# Patient Record
Sex: Female | Born: 1955
Health system: Southern US, Community
[De-identification: ages and names within clinical notes are randomized; demographics above are authoritative.]

## PROBLEM LIST (undated history)

## (undated) DIAGNOSIS — M419 Scoliosis, unspecified: Secondary | ICD-10-CM

## (undated) DIAGNOSIS — I639 Cerebral infarction, unspecified: Secondary | ICD-10-CM

## (undated) DIAGNOSIS — E785 Hyperlipidemia, unspecified: Secondary | ICD-10-CM

## (undated) DIAGNOSIS — M810 Age-related osteoporosis without current pathological fracture: Secondary | ICD-10-CM

## (undated) DIAGNOSIS — F411 Generalized anxiety disorder: Secondary | ICD-10-CM

## (undated) DIAGNOSIS — C539 Malignant neoplasm of cervix uteri, unspecified: Secondary | ICD-10-CM

## (undated) DIAGNOSIS — F32A Depression, unspecified: Secondary | ICD-10-CM

## (undated) DIAGNOSIS — Z923 Personal history of irradiation: Secondary | ICD-10-CM

## (undated) DIAGNOSIS — R87619 Unspecified abnormal cytological findings in specimens from cervix uteri: Secondary | ICD-10-CM

## (undated) DIAGNOSIS — M1990 Unspecified osteoarthritis, unspecified site: Secondary | ICD-10-CM

## (undated) DIAGNOSIS — Z8669 Personal history of other diseases of the nervous system and sense organs: Secondary | ICD-10-CM

## (undated) DIAGNOSIS — K219 Gastro-esophageal reflux disease without esophagitis: Secondary | ICD-10-CM

## (undated) DIAGNOSIS — F419 Anxiety disorder, unspecified: Secondary | ICD-10-CM

## (undated) DIAGNOSIS — R52 Pain, unspecified: Secondary | ICD-10-CM

## (undated) DIAGNOSIS — K8020 Calculus of gallbladder without cholecystitis without obstruction: Secondary | ICD-10-CM

## (undated) HISTORY — DX: Unspecified osteoarthritis, unspecified site: M19.90

## (undated) HISTORY — DX: Personal history of irradiation: Z92.3

## (undated) HISTORY — PX: CHOLECYSTECTOMY: SHX55

## (undated) HISTORY — DX: Hyperlipidemia, unspecified: E78.5

## (undated) HISTORY — DX: Gastro-esophageal reflux disease without esophagitis: K21.9

## (undated) HISTORY — DX: Calculus of gallbladder without cholecystitis without obstruction: K80.20

## (undated) HISTORY — DX: Depression, unspecified: F32.A

## (undated) HISTORY — PX: TENDON REPAIR: SHX5111

## (undated) HISTORY — PX: FOOT SURGERY: SHX648

## (undated) HISTORY — DX: Cerebral infarction, unspecified: I63.9

## (undated) HISTORY — DX: Anxiety disorder, unspecified: F41.9

## (undated) HISTORY — DX: Age-related osteoporosis without current pathological fracture: M81.0

## (undated) MED FILL — Sodium Citrate Lock Flush IV Soln Pref Syr 120 MG/3ML (4%): INTRAVENOUS | Qty: 6 | Status: AC

---
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
## 1999-10-10 ENCOUNTER — Other Ambulatory Visit: Admission: RE | Admit: 1999-10-10 | Discharge: 1999-10-10 | Payer: Self-pay | Admitting: Gynecology

## 2000-03-27 ENCOUNTER — Encounter: Admission: RE | Admit: 2000-03-27 | Discharge: 2000-03-27 | Payer: Self-pay | Admitting: Gynecology

## 2000-03-27 ENCOUNTER — Encounter: Payer: Self-pay | Admitting: Gynecology

## 2000-10-08 ENCOUNTER — Other Ambulatory Visit: Admission: RE | Admit: 2000-10-08 | Discharge: 2000-10-08 | Payer: Self-pay | Admitting: Gynecology

## 2001-06-03 ENCOUNTER — Encounter: Admission: RE | Admit: 2001-06-03 | Discharge: 2001-06-03 | Payer: Self-pay | Admitting: Gynecology

## 2001-06-03 ENCOUNTER — Encounter: Payer: Self-pay | Admitting: Gynecology

## 2001-10-14 ENCOUNTER — Other Ambulatory Visit: Admission: RE | Admit: 2001-10-14 | Discharge: 2001-10-14 | Payer: Self-pay | Admitting: Gynecology

## 2003-05-08 ENCOUNTER — Encounter: Payer: Self-pay | Admitting: Gynecology

## 2003-05-08 ENCOUNTER — Encounter: Admission: RE | Admit: 2003-05-08 | Discharge: 2003-05-08 | Payer: Self-pay | Admitting: Gynecology

## 2003-05-08 ENCOUNTER — Other Ambulatory Visit: Admission: RE | Admit: 2003-05-08 | Discharge: 2003-05-08 | Payer: Self-pay | Admitting: Gynecology

## 2003-08-22 HISTORY — PX: CHOLECYSTECTOMY OPEN: SUR202

## 2004-05-17 ENCOUNTER — Other Ambulatory Visit: Admission: RE | Admit: 2004-05-17 | Discharge: 2004-05-17 | Payer: Self-pay | Admitting: Gynecology

## 2004-05-17 ENCOUNTER — Encounter: Admission: RE | Admit: 2004-05-17 | Discharge: 2004-05-17 | Payer: Self-pay | Admitting: Gynecology

## 2005-08-16 ENCOUNTER — Encounter: Admission: RE | Admit: 2005-08-16 | Discharge: 2005-08-16 | Payer: Self-pay | Admitting: Gynecology

## 2005-08-16 ENCOUNTER — Other Ambulatory Visit: Admission: RE | Admit: 2005-08-16 | Discharge: 2005-08-16 | Payer: Self-pay | Admitting: Gynecology

## 2006-08-21 HISTORY — PX: TENDON REPAIR: SHX5111

## 2006-08-24 ENCOUNTER — Other Ambulatory Visit: Admission: RE | Admit: 2006-08-24 | Discharge: 2006-08-24 | Payer: Self-pay | Admitting: Gynecology

## 2006-08-27 ENCOUNTER — Encounter: Admission: RE | Admit: 2006-08-27 | Discharge: 2006-08-27 | Payer: Self-pay | Admitting: Gynecology

## 2007-09-02 ENCOUNTER — Encounter: Admission: RE | Admit: 2007-09-02 | Discharge: 2007-09-02 | Payer: Self-pay | Admitting: Gynecology

## 2008-09-03 ENCOUNTER — Encounter: Admission: RE | Admit: 2008-09-03 | Discharge: 2008-09-03 | Payer: Self-pay | Admitting: Gynecology

## 2009-09-24 ENCOUNTER — Encounter: Admission: RE | Admit: 2009-09-24 | Discharge: 2009-09-24 | Payer: Self-pay | Admitting: Gynecology

## 2010-09-29 ENCOUNTER — Other Ambulatory Visit: Payer: Self-pay | Admitting: Gynecology

## 2010-09-29 DIAGNOSIS — Z1231 Encounter for screening mammogram for malignant neoplasm of breast: Secondary | ICD-10-CM

## 2010-11-15 ENCOUNTER — Ambulatory Visit
Admission: RE | Admit: 2010-11-15 | Discharge: 2010-11-15 | Disposition: A | Discharge status: Home / Self Care | Payer: 59 | Source: Ambulatory Visit | Attending: Gynecology | Admitting: Gynecology

## 2010-11-15 DIAGNOSIS — Z1231 Encounter for screening mammogram for malignant neoplasm of breast: Secondary | ICD-10-CM

## 2011-10-04 ENCOUNTER — Other Ambulatory Visit: Payer: Self-pay | Admitting: Gynecology

## 2011-10-04 DIAGNOSIS — Z1231 Encounter for screening mammogram for malignant neoplasm of breast: Secondary | ICD-10-CM

## 2011-11-16 ENCOUNTER — Ambulatory Visit
Admission: RE | Admit: 2011-11-16 | Discharge: 2011-11-16 | Disposition: A | Discharge status: Home / Self Care | Payer: 59 | Source: Ambulatory Visit | Attending: Gynecology | Admitting: Gynecology

## 2011-11-16 DIAGNOSIS — Z1231 Encounter for screening mammogram for malignant neoplasm of breast: Secondary | ICD-10-CM

## 2012-09-21 LAB — HM PAP SMEAR: HM PAP: NORMAL

## 2012-09-21 LAB — HM MAMMOGRAPHY

## 2012-12-30 ENCOUNTER — Other Ambulatory Visit: Payer: Self-pay

## 2012-12-30 DIAGNOSIS — Z1231 Encounter for screening mammogram for malignant neoplasm of breast: Secondary | ICD-10-CM

## 2013-01-21 ENCOUNTER — Ambulatory Visit
Admission: RE | Admit: 2013-01-21 | Discharge: 2013-01-21 | Disposition: A | Discharge status: Home / Self Care | Payer: 59 | Source: Ambulatory Visit

## 2013-01-21 DIAGNOSIS — Z1231 Encounter for screening mammogram for malignant neoplasm of breast: Secondary | ICD-10-CM

## 2013-09-02 ENCOUNTER — Ambulatory Visit: Payer: 59 | Admitting: Family Medicine

## 2013-09-02 ENCOUNTER — Ambulatory Visit (INDEPENDENT_AMBULATORY_CARE_PROVIDER_SITE_OTHER): Payer: 59 | Admitting: Family Medicine

## 2013-09-02 ENCOUNTER — Encounter: Payer: Self-pay | Admitting: Family Medicine

## 2013-09-02 VITALS — BP 110/70 | Temp 99.0°F | Ht 65.25 in | Wt 166.0 lb

## 2013-09-02 DIAGNOSIS — Z23 Encounter for immunization: Secondary | ICD-10-CM

## 2013-09-02 DIAGNOSIS — M810 Age-related osteoporosis without current pathological fracture: Secondary | ICD-10-CM

## 2013-09-02 HISTORY — DX: Age-related osteoporosis without current pathological fracture: M81.0

## 2013-09-02 NOTE — Progress Notes (Signed)
Chief Complaint  Patient presents with  . Establish Care    HPI:  Taylor Buchanan is here to establish care.  Last PCP and physical: Used to see Dr. Ubaldo Glassing, now seeing Eve [facility] - getting yearly physicals. Had a pap in feb and was normal, all normal for 15 years. Had labs in last few years and normal.  Has the following chronic problems and concerns today:  Has osteoporosis: -has been bisphosphates and did not feel well - but really doesn't want to take these -reports has severe osteoporosis - would like to speak with endocrine about options/risks/etc  Patient Active Problem List   Diagnosis Date Noted  . Osteoporosis, unspecified 09/02/2013   Health Maintenance: UTD  ROS: See pertinent positives and negatives per HPI.  Past Medical History  Diagnosis Date  . Osteoporosis, unspecified 09/02/2013    Family History  Problem Relation Age of Onset  . Mental illness Mother   . Cancer Father     bladder    History   Social History  . Marital Status: Married    Spouse Name: N/A    Number of Children: N/A  . Years of Education: N/A   Social History Main Topics  . Smoking status: Former Research scientist (life sciences)  . Smokeless tobacco: None     Comment: 1 ppd for 15 years, quit 20 years ago  . Alcohol Use: Yes     Comment: occ   . Drug Use: None  . Sexual Activity: None   Other Topics Concern  . 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  GENERAL: vitals  reviewed and listed above, alert, oriented, appears well hydrated and in no acute distress  HEENT: atraumatic, conjunttiva clear, no obvious abnormalities on inspection of external nose and ears  NECK: no obvious masses on inspection  LUNGS: clear to auscultation bilaterally, no wheezes, rales or rhonchi, good air movement  CV: HRRR, no peripheral edema  MS: moves all extremities without noticeable abnormality  PSYCH: pleasant and cooperative, no obvious depression or anxiety  ASSESSMENT AND PLAN:  Discussed the following assessment and plan:  Need for prophylactic vaccination with combined diphtheria-tetanus-pertussis (DTP) vaccine - Plan: Tdap vaccine greater than or equal to 7yo IM  Osteoporosis - Plan: Ambulatory referral to Endocrinology  Osteoporosis, unspecified -We reviewed the PMH, PSH, FH, SH, Meds and Allergies. -We provided refills for any medications we will prescribe as needed. -We addressed current concerns per orders and patient instructions. -We have asked for records for pertinent exams, studies, vaccines and notes from previous providers. -We have advised patient to follow up per instructions below. -follow up 3-4 months for physical and carpal tunnel - she wants to get women's physicals with [place]  -Patient advised to return or notify a doctor immediately if symptoms worsen or persist or new concerns arise.  Patient Instructions  --PLEASE SIGN UP FOR MYCHART TODAY   We recommend the following healthy lifestyle measures: - eat a healthy diet consisting of lots of vegetables, fruits, beans, nuts, seeds, healthy meats such as white chicken and fish and whole grains.  - avoid fried foods, fast food, processed foods, sodas, red meet and other fattening foods.  - get a least 150 minutes of aerobic exercise per week.   Vitamin D3 1000 IU daily  Calcium 1200mg  total from diet and supplement  Weightbearing exercise at least 5 days per week  -We placed a referral  for you as discussed. It usually takes about 1-2 weeks to process and schedule this referral. If you have not heard from [place] regarding this appointment in 2 weeks please contact our office.  -Cock Up Brace for hand pain  Follow up in: 3-4 months      KIM, HANNAH R.

## 2013-09-02 NOTE — Patient Instructions (Signed)
--  PLEASE SIGN UP FOR MYCHART TODAY   We recommend the following healthy lifestyle measures: - eat a healthy diet consisting of lots of vegetables, fruits, beans, nuts, seeds, healthy meats such as white chicken and fish and whole grains.  - avoid fried foods, fast food, processed foods, sodas, red meet and other fattening foods.  - get a least 150 minutes of aerobic exercise per week.   Vitamin D3 1000 IU daily  Calcium 1200mg  total from diet and supplement  Weightbearing exercise at least 5 days per week  -We placed a referral for you as discussed. It usually takes about 1-2 weeks to process and schedule this referral. If you have not heard from [place] regarding this appointment in 2 weeks please contact our office.  -Cock Up Brace for hand pain  Follow up in: 3-4 months

## 2013-10-06 ENCOUNTER — Ambulatory Visit: Payer: 59 | Admitting: Internal Medicine

## 2013-11-03 ENCOUNTER — Other Ambulatory Visit: Payer: Self-pay | Admitting: Internal Medicine

## 2013-11-03 ENCOUNTER — Ambulatory Visit (INDEPENDENT_AMBULATORY_CARE_PROVIDER_SITE_OTHER): Payer: 59 | Admitting: Internal Medicine

## 2013-11-03 ENCOUNTER — Encounter: Payer: Self-pay | Admitting: Internal Medicine

## 2013-11-03 VITALS — BP 114/68 | HR 51 | Temp 97.9°F | Resp 12 | Ht 65.75 in | Wt 166.0 lb

## 2013-11-03 DIAGNOSIS — M810 Age-related osteoporosis without current pathological fracture: Secondary | ICD-10-CM

## 2013-11-03 LAB — COMPREHENSIVE METABOLIC PANEL
ALBUMIN: 4.3 g/dL (ref 3.5–5.2)
ALT: 12 U/L (ref 0–35)
AST: 23 U/L (ref 0–37)
Alkaline Phosphatase: 57 U/L (ref 39–117)
BUN: 15 mg/dL (ref 6–23)
CALCIUM: 9.4 mg/dL (ref 8.4–10.5)
CHLORIDE: 104 meq/L (ref 96–112)
CO2: 28 meq/L (ref 19–32)
Creat: 0.7 mg/dL (ref 0.50–1.10)
Glucose, Bld: 81 mg/dL (ref 70–99)
POTASSIUM: 4.3 meq/L (ref 3.5–5.3)
SODIUM: 139 meq/L (ref 135–145)
TOTAL PROTEIN: 6.6 g/dL (ref 6.0–8.3)
Total Bilirubin: 0.3 mg/dL (ref 0.2–1.2)

## 2013-11-03 NOTE — Progress Notes (Signed)
Patient ID: Taylor Buchanan, female   DOB: 1955/08/30, 58 y.o.   MRN: [id number]   HPI  Taylor Buchanan is a 58 y.o.-year-old female, referred by her PCP, Dr. Maudie Mercury, for evaluation for osteoporosis. She saw Dr Ubaldo Glassing in the past (he retired) - he was managing her OP.  Pt was dx with OP in 2011 and had Openia since 2005 (first 2005). She denies any fractures or falls. No dizziness/vertigo/orthostasis. She feels she shrunk 1 in.  I reviewed pt's DEXA scans - Dr Ubaldo Glassing office [facility]): Date L1-L4 T score (*severe scoliosis) FN T score 33% distal Radius  11/15/2010 -2.3 LFN: -2.2, RFN: -1.9;  mean -2.1 n/a  09/24/2009 -2.5 mean -1.9   08/24/2006 -1.6 mean -1.5   05/17/2004 -1.5 mean -2.3    She has been on the following OP treatments:  - started Actonel, then switched to Boniva  - for 1 year >> improvement in BMD >> stopped 2-3 years ago  No h/o hyper/hypocalcemia. No h/o hyperparathyroidism. No h/o kidney stones.  No h/o thyrotoxicosis. Last tsh 2.245 in 2012.  No h/o vitamin D deficiency.   No h/o CKD. BUN/Cr 10/0.76 in 2012, I do not have recent labs.  Pt is on calcium carbonated 600 mg  - takes it fasting (in am) and vitamin D 1400 units a day. She also eats dairy and green, leafy, vegetables.   No weight bearing exercises.   She does not take high vitamin A doses.  Pt does have a FH of osteoporosis - great aunt broke hip. No FH of kyphosis.   Menopause was at 58 y/o - right after father died.   ROS: Constitutional: no weight gain/loss, no fatigue, no subjective hyperthermia/hypothermia Eyes: no blurry vision, no xerophthalmia ENT: no sore throat, no nodules palpated in throat, no dysphagia/odynophagia, no hoarseness Cardiovascular: no CP/SOB/palpitations/leg swelling Respiratory: no cough/SOB Gastrointestinal: no N/V/D/C Musculoskeletal: no muscle/joint aches Skin: no rashes Neurological: no tremors/numbness/tingling/dizziness Psychiatric: no depression/anxiety  Past Medical  History  Diagnosis Date  . Osteoporosis, unspecified 09/02/2013   Past Surgical History  Procedure Laterality Date  . Cholecystectomy    . Foot surgery     History   Social History  . Marital Status: Married    Spouse Name: N/A    Number of Children: 2   Social History Main Topics  . Smoking status: Former Research scientist (life sciences)  . Smokeless tobacco: Not on file     Comment: 1 ppd for 15 years, quit 20 years ago  . Alcohol Use: Yes     Comment: occ   . 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 calcium carbonate (OS-CAL) 600 MG TABS tablet Take 600 mg by mouth 2 (two) times daily with a meal.      . cholecalciferol (VITAMIN D) 1000 UNITS tablet Take 1,000 Units by mouth daily.       No current facility-administered medications on file prior to visit.   No Known Allergies Family History  Problem Relation Age of Onset  . Mental illness Mother   . Cancer Father     bladder   PE: BP 114/68  Pulse 51  Temp(Src) 97.9 F (36.6 C) (Oral)  Resp 12  Ht 5' 5.75" (1.67 m)  Wt 166 lb (75.297 kg)  BMI 27.00 kg/m2  SpO2 97%  LMP 08/21/2002 Wt Readings from Last 3 Encounters:  11/03/13 166 lb (75.297 kg)  09/02/13 166 lb (75.297 kg)   Constitutional: normal weight, in NAD. No kyphosis.  Eyes: PERRLA, EOMI, no exophthalmos ENT: moist mucous membranes, no thyromegaly, no cervical lymphadenopathy Cardiovascular: RRR, No MRG Respiratory: CTA B Gastrointestinal: abdomen soft, NT, ND, BS+ Musculoskeletal: no deformities, strength intact in all 4 Skin: moist, warm, no rashes Neurological: no tremor with outstretched hands, DTR normal in all 4  Assessment: 1. Osteoporosis - 1 year po bisphosphonates - noncompliance  Plan: 1. Osteoporosis -  likely postmenopausal  - Discussed about increased risk of fracture, depending on the T score, greatly increased when the T score is at or lower than -2.5  - We reviewed her previous DEXA scans together, and I explained that based on the T scores, she has an increased risk for fractures.  - We discussed about the different medication classes, benefits and side effects (including atypical fractures and ONJ - no dental workup in progress or planned). She has a h/o noncompliance with  oral bisphosphonates, so my first choice would be IV bisphosphonate (zoledronic acid = Reclast) or sq denosumab (Prolia). I don't think she is a candidate for Teriparatide at the moment. She agrees that, if needed, the once a year or once every 6 mo tx would be a much better choice for her than a daily med. - we reviewed her dietary and supplemental calcium and vitamin D intake, which I believe are adequate. Pt ends up getting at least 1500 units vitamin D at least 1200 mg of calcium daily. I advised her to continue this - given her specific instructions about food sources for these - see pt instructions  - discussed fall precautions  - given handout from [facility]: weight bearing exercises - every day or at least 5/7 days - we discussed about different possible directions for management. I advised her to try to have a daily protein amount of ~0.8 g per kilogram per day. I advised her to try to aim for this amount, since a diet low in proteins can exacerbate osteoporosis. She does not smoke or drink >2 drinks of alcohol a day.  - We will check the following tests:  Vitamin D  CMP - will check a new DEXA scan and if decreases further, I would strongly encourage treatment, likely in the form of zoledronic acid or denosumab  - will see pt back in a year  Orders Only on 11/03/2013  Component Date Value Ref Range Status  . Sodium 11/03/2013 139  135 - 145 mEq/L Final  . Potassium 11/03/2013 4.3  3.5  - 5.3 mEq/L Final  . Chloride 11/03/2013 104  96 - 112 mEq/L Final  . CO2 11/03/2013 28  19 - 32 mEq/L Final  . Glucose, Bld 11/03/2013 81  70 - 99 mg/dL Final  . BUN 11/03/2013 15  6 - 23 mg/dL Final  . Creat 11/03/2013 0.70  0.50 - 1.10 mg/dL Final  . Total Bilirubin 11/03/2013 0.3  0.2 - 1.2 mg/dL Final  . Alkaline Phosphatase 11/03/2013 57  39 - 117 U/L Final  . AST 11/03/2013 23  0 - 37 U/L Final  . ALT 11/03/2013 12  0 - 35 U/L Final  . Total Protein 11/03/2013 6.6  6.0 - 8.3 g/dL Final  . Albumin 11/03/2013 4.3  3.5 -  5.2 g/dL Final  . Calcium 11/03/2013 9.4  8.4 - 10.5 mg/dL Final  . Vit D, 25-Hydroxy 11/03/2013 57  30 - 89 ng/mL Final   Comment: This assay accurately quantifies Vitamin D, which is the sum of the                          25-Hydroxy forms of Vitamin D2 and D3.  Studies have shown that the                          optimum concentration of 25-Hydroxy Vitamin D is 30 ng/mL or higher.                           Concentrations of Vitamin D between 20 and 29 ng/mL are considered to                          be insufficient and concentrations less than 20 ng/mL are considered                          to be deficient for Vitamin D.   Excellent labs. Will await DEXA.  Date L1-L4 T score (*severe scoliosis) FN T score 33% distal Radius  11/17/2013 Not calculated LFN: -2.2, RFN: -1.7 n/a  FRAX: 9.1% MOF risk at 10 years, 1.3% Hip fx risk at 10 years.  Stable scores. We can maybe wait another 1-2 years and recheck then.

## 2013-11-03 NOTE — Patient Instructions (Addendum)
Please return in a year.  We will call you with the DEXA schedule.  Please stop at [facility] lab.  Make sure you get ~1200 mg calcium daily from diet and supplements.  Make sure you get ~ 1500- 2000 units vitamin D daily from diet and supplements.  How Can I Prevent Falls? Men and women with osteoporosis need to take care not to fall down. Falls can break bones. Some reasons people fall are: Poor vision  Poor balance  Certain diseases that affect how you walk  Some types of medicine, such as sleeping pills.  Some tips to help prevent falls outdoors are: Use a cane or walker  Wear rubber-soled shoes so you don't slip  Walk on grass when sidewalks are slippery  In winter, put salt or kitty litter on icy sidewalks.  Some ways to help prevent falls indoors are: Keep rooms free of clutter, especially on floors  Use plastic or carpet runners on slippery floors  Wear low-heeled shoes that provide good support  Do not walk in socks, stockings, or slippers  Be sure carpets and area rugs have skid-proof backs or are tacked to the floor  Be sure stairs are well lit and have rails on both sides  Put grab bars on bathroom walls near tub, shower, and toilet  Use a rubber bath mat in the shower or tub  Keep a flashlight next to your bed  Use a sturdy step stool with a handrail and wide steps  Add more lights in rooms (and night lights) Buy a cordless phone to keep with you so that you don't have to rush to the phone       when it rings and so that you can call for help if you fall.   (adapted from http://www.niams.NightlifePreviews.se)  Dietary sources of calcium and vitamin D:  Calcium content (mg) - http://www.niams.MoviePins.co.za  Fortified oatmeal, 1 packet 350  Sardines, canned in oil, with edible bones, 3 oz. 324  Cheddar cheese, 1 oz. shredded 306  Milk, nonfat, 1 cup 302  Milkshake, 1 cup 300  Yogurt, plain, low-fat, 1  cup 300  Soybeans, cooked, 1 cup 261  Tofu, firm, with calcium,  cup 204  Orange juice, fortified with calcium, 6 oz. 200-260 (varies)  Salmon, canned, with edible bones, 3 oz. 181  Pudding, instant, made with 2% milk,  cup 153  Baked beans, 1 cup [place], 1% milk fat, 1 cup 138  Spaghetti, lasagna, 1 cup 125  Frozen yogurt, vanilla, soft-serve,  cup 103  Ready-to-eat cereal, fortified with calcium, 1 cup 100-1,000 (varies)  Cheese pizza, 1 slice 235  Fortified waffles, 2 100  Turnip greens, boiled,  cup 99  Broccoli, raw, 1 cup 90  Ice cream, vanilla,  cup 85  Soy or rice milk, fortified with calcium, 1 cup 80-500 (varies)   Vitamin D content (International Units, IU) - https://www.ars.usda.gov Cod liver oil, 1 tablespoon 1,360  Swordfish, cooked, 3 oz 566  Salmon (sockeye), cooked, 3 oz 447  Tuna fish, canned in water, drained, 3 oz 154  Orange juice fortified with vitamin D, 1 cup (check product labels, as amount of added vitamin D varies) 137  Milk, nonfat, reduced fat, and whole, vitamin D-fortified, 1 cup 115-124  Yogurt, fortified with 20% of the daily value for vitamin D, 6 oz 80  Margarine, fortified, 1 tablespoon 60  Sardines, canned in oil, drained, 2 sardines 46  Liver, beef, cooked, 3 oz 42  Egg, 1  large (vitamin D is found in yolk) 41  Ready-to-eat cereal, fortified with 10% of the daily value for vitamin D, 0.75-1 cup  40  Cheese, Swiss, 1 oz 6   Exercise for Strong Bones (from [employer]) There are two types of exercises that are important for building and maintaining bone density:  weight-bearing and muscle-strengthening exercises. Weight-bearing Exercises These exercises include activities that make you move against gravity while staying upright. Weight-bearing exercises can be high-impact or low-impact. High-impact weight-bearing exercises help build bones and keep them strong. If you have broken a bone due to osteoporosis  or are at risk of breaking a bone, you may need to avoid high-impact exercises. If you're not sure, you should check with your healthcare provider. Examples of high-impact weight-bearing exercises are:   Dancing   Doing high-impact aerobics   Hiking   Jogging/running   Jumping Rope   Stair climbing   Tennis Low-impact weight-bearing exercises can also help keep bones strong and are a safe alternative if you cannot do high-impact exercises. Examples of low-impact weight-bearing exercises are:   Using elliptical training machines   Doing low-impact aerobics   Using stair-step machines   Fast walking on a treadmill or outside Muscle-Strengthening Exercises These exercises include activities where you move your body, a weight or some other resistance against gravity. They are also known as resistance exercises and include:   Lifting weights   Using elastic exercise bands   Using weight machines   Lifting your own body weight   Functional movements, such as standing and rising up on your toes Yoga and Pilates can also improve strength, balance and flexibility. However, certain positions may not be safe for people with osteoporosis or those at increased risk of broken bones. For example, exercises that have you bend forward may increase the chance of breaking a bone in the spine. A physical therapist should be able to help you learn which exercises are safe and appropriate for you. Non-Impact Exercises Non-impact exercises can help you to improve balance, posture and how well you move in everyday activities. These exercises can also help to increase muscle strength and decrease the risk of falls and broken bones. Some of these exercises include:   Balance exercises that strengthen your legs and test your balance, such as Tai Chi, can decrease your risk of falls.   Posture exercises that improve your posture and reduce rounded or "sloping" shoulders can help you decrease the chance of breaking a  bone, especially in the spine.   Functional exercises that improve how well you move can help you with everyday activities and decrease your chance of falling and breaking a bone. For example, if you have trouble getting up from a chair or climbing stairs, you should do these activities as exercises. A physical therapist can teach you balance, posture and functional exercises. Starting a New Exercise Program If you haven't exercised regularly for a while, check with your healthcare provider before beginning a new exercise program-particularly if you have health problems such as heart disease, diabetes or high blood pressure. If you're at high risk of breaking a bone, you should work with a physical therapist to develop a safe exercise program. Once you have your healthcare provider's approval, start slowly. If you've already broken bones in the spine because of osteoporosis, be very careful to avoid activities that require reaching down, bending forward, rapid twisting motions, heavy lifting and those that increase your chance of a fall. As you get  started, your muscles may feel sore for a day or two after you exercise. If soreness lasts longer, you may be working too hard and need to ease up. Exercises should be done in a pain-free range of motion. How Much Exercise Do You Need? Weight-bearing exercises 30 minutes on most days of the week. Do a 30-minutesession or multiple sessions spread out throughout the day. The benefits to your bones are the same.   Muscle-strengthening exercises Two to three days per week. If you don't have much time for strengthening/resistance training, do small amounts at a time. You can do just one body part each day. For example do arms one day, legs the next and trunk the next. You can also spread these exercises out during your normal day.  Balance, posture and functional exercises Every day or as often as needed. You may want to focus on one area more than the others. If you  have fallen or lose your balance, spend time doing balance exercises. If you are getting rounded shoulders, work more on posture exercises. If you have trouble climbing stairs or getting up from the couch, do more functional exercises. You can also perform these exercises at one time or spread them during your day. Work with a phyiscal therapist to learn the right exercises for you.

## 2013-11-04 ENCOUNTER — Encounter: Payer: Self-pay | Admitting: *Deleted

## 2013-11-04 LAB — VITAMIN D 25 HYDROXY (VIT D DEFICIENCY, FRACTURES): VIT D 25 HYDROXY: 57 ng/mL (ref 30–89)

## 2013-11-10 ENCOUNTER — Other Ambulatory Visit: Payer: 59

## 2013-11-17 ENCOUNTER — Ambulatory Visit (INDEPENDENT_AMBULATORY_CARE_PROVIDER_SITE_OTHER)
Admission: RE | Admit: 2013-11-17 | Discharge: 2013-11-17 | Disposition: A | Discharge status: Home / Self Care | Payer: 59 | Source: Ambulatory Visit | Attending: Internal Medicine | Admitting: Internal Medicine

## 2013-11-17 DIAGNOSIS — M810 Age-related osteoporosis without current pathological fracture: Secondary | ICD-10-CM

## 2013-11-20 ENCOUNTER — Encounter: Payer: Self-pay | Admitting: *Deleted

## 2013-11-24 ENCOUNTER — Ambulatory Visit (INDEPENDENT_AMBULATORY_CARE_PROVIDER_SITE_OTHER): Payer: 59

## 2013-11-24 ENCOUNTER — Encounter: Payer: Self-pay | Admitting: Podiatry

## 2013-11-24 ENCOUNTER — Ambulatory Visit (INDEPENDENT_AMBULATORY_CARE_PROVIDER_SITE_OTHER): Payer: 59 | Admitting: Podiatry

## 2013-11-24 VITALS — BP 120/79 | HR 63 | Resp 16

## 2013-11-24 DIAGNOSIS — D219 Benign neoplasm of connective and other soft tissue, unspecified: Secondary | ICD-10-CM

## 2013-11-24 DIAGNOSIS — D3610 Benign neoplasm of peripheral nerves and autonomic nervous system, unspecified: Secondary | ICD-10-CM

## 2013-11-24 DIAGNOSIS — M8448XA Pathological fracture, other site, initial encounter for fracture: Secondary | ICD-10-CM

## 2013-11-24 NOTE — Progress Notes (Signed)
Having pain with the right foot inbetween the great toe and the second toe

## 2013-11-24 NOTE — Progress Notes (Signed)
Subjective:     Patient ID: Taylor Buchanan, female   DOB: 1956/05/24, 58 y.o.   MRN: [id number]  HPI patient presents stating around one week ago I started to get a lot of pain in my forefoot right and is making it difficult for me bear weight on it   Review of Systems     Objective:   Physical Exam Neuro logical status is intact with no changes in health history patient well oriented x3. Moderate discomfort and swelling around the second metatarsal distal shaft right with minimal discomfort into the joint    Assessment:     Probable stress fracture of the right second metatarsal with possibility of capsulitis    Plan:     H&P and x-ray reviewed. I immobilize with Darco shoe and gave instructions on boot usage at home. She is going to [place] and will use boot while bear and will reappoint to me for reevaluation and x-ray in 3 weeks

## 2013-12-15 ENCOUNTER — Ambulatory Visit (INDEPENDENT_AMBULATORY_CARE_PROVIDER_SITE_OTHER): Payer: 59

## 2013-12-15 ENCOUNTER — Ambulatory Visit (INDEPENDENT_AMBULATORY_CARE_PROVIDER_SITE_OTHER): Payer: 59 | Admitting: Podiatry

## 2013-12-15 ENCOUNTER — Encounter: Payer: Self-pay | Admitting: Podiatry

## 2013-12-15 VITALS — BP 145/79 | HR 70 | Resp 12

## 2013-12-15 DIAGNOSIS — R52 Pain, unspecified: Secondary | ICD-10-CM

## 2013-12-15 DIAGNOSIS — M8448XA Pathological fracture, other site, initial encounter for fracture: Secondary | ICD-10-CM

## 2013-12-17 NOTE — Progress Notes (Signed)
Subjective:     Patient ID: Taylor Buchanan, female   DOB: 1955-09-09, 58 y.o.   MRN: [id number]  HPI patient presents stating it is still sore but improved over where it was with immobilization   Review of Systems     Objective:   Physical Exam Neurovascular status intact with continued edema around the shaft of the second metatarsal and pain to deep palpation but much better than previous    Assessment:     Probable stress fracture right foot that is gradually healing    Plan:     X-rays reviewed with patient and advised on continued immobilization with ice therapy and gradual increase in activity over the next 4 weeks. Reappoint for me to recheck at that time

## 2013-12-23 ENCOUNTER — Ambulatory Visit (INDEPENDENT_AMBULATORY_CARE_PROVIDER_SITE_OTHER): Payer: 59 | Admitting: Family Medicine

## 2013-12-23 VITALS — BP 98/66 | HR 66 | Temp 98.4°F | Ht 65.25 in | Wt 163.0 lb

## 2013-12-23 DIAGNOSIS — S92909A Unspecified fracture of unspecified foot, initial encounter for closed fracture: Secondary | ICD-10-CM

## 2013-12-23 DIAGNOSIS — Z0000 Encounter for general adult medical examination without abnormal findings: Secondary | ICD-10-CM

## 2013-12-23 DIAGNOSIS — S92901A Unspecified fracture of right foot, initial encounter for closed fracture: Secondary | ICD-10-CM

## 2013-12-23 DIAGNOSIS — M810 Age-related osteoporosis without current pathological fracture: Secondary | ICD-10-CM

## 2013-12-23 LAB — LIPID PANEL
CHOL/HDL RATIO: 3
Cholesterol: 148 mg/dL (ref 0–200)
HDL: 48.1 mg/dL (ref >39.00)
LDL Cholesterol: 82 mg/dL (ref 0–99)
Triglycerides: 90 mg/dL (ref 0.0–149.0)
VLDL: 18 mg/dL (ref 0.0–40.0)

## 2013-12-23 NOTE — Patient Instructions (Signed)
We recommend the following healthy lifestyle measures: - eat a healthy diet consisting of lots of vegetables, fruits, beans, nuts, seeds, healthy meats such as white chicken and fish and whole grains.  - avoid fried foods, fast food, processed foods, sodas, red meet and other fattening foods.  - get a least 150 minutes of aerobic exercise per week - consider cycling, elliptical, water aerobics, small weights  -We have ordered labs or studies at this visit. It can take up to 1-2 weeks for results and processing. We will contact you with instructions IF your results are abnormal. Normal results will be released to your [facility]. If you have not heard from [place] or can not find your results in [facility] in 2 weeks please contact our office.  Vitamin D3 2000 IU daily, calcium 1200mg  from diet and supplement daily

## 2013-12-23 NOTE — Progress Notes (Signed)
Pre visit review using our clinic review tool, if applicable. No additional management support is needed unless otherwise documented below in the visit note. 

## 2013-12-23 NOTE — Progress Notes (Signed)
No chief complaint on file.   HPI:  Here for CPE:  -Concerns today:   Healing foot fx and overweight: -foot being treated by podiatry -wonders what sort of exercise would be ok  Osteoporosis: -seen by endocrine -she want to know how much Vit D and calcium to take and when to repeat dexa  -Diet: variety of foods, balance and well rounded  -Taking Vit D3 1000 IU of vit; 1200mg  calcium   -Exercise: no exercise right now as fx in foot is healing  -Diabetes and Dyslipidemia Screening: will do lipid screening today  -Hx of HTN: no  -Vaccines: UTD  -pap history:09/21/12 normal, sees gyn  -FDLMP: N/A  -sexual activity: yes, female partner, no new partners  -wants STI testing: no  -FH breast, colon or ovarian ca: see FH  -Alcohol, Tobacco, drug use: see social history  Review of Systems - Review of Systems  Constitutional: Negative for chills, weight loss and malaise/fatigue.  HENT: Negative for hearing loss.   Eyes: Negative for blurred vision and double vision.  Respiratory: Negative for cough, sputum production and shortness of breath.   Cardiovascular: Negative for chest pain and palpitations.  Gastrointestinal: Negative for heartburn, nausea, vomiting, blood in stool and melena.  Genitourinary: Negative for dysuria and urgency.  Musculoskeletal: Negative for falls and myalgias.  Skin: Negative for rash.  Neurological: Negative for dizziness, tingling and headaches.  Endo/Heme/Allergies: Does not bruise/bleed easily.  Psychiatric/Behavioral: Negative for depression and suicidal ideas.     Past Medical History  Diagnosis Date  . Osteoporosis, unspecified 09/02/2013    Past Surgical History  Procedure Laterality Date  . Cholecystectomy    . Foot surgery      Family History  Problem Relation Age of Onset  . Mental illness Mother   . Cancer Father     bladder    History   Social History  . Marital Status: Married    Spouse Name: N/A    Number of  Children: N/A  . Years of Education: N/A   Social History Main Topics  . Smoking status: Former Research scientist (life sciences)  . Smokeless tobacco: Not on file     Comment: 1 ppd for 15 years, quit 20 years ago  . Alcohol Use: Yes     Comment: occ   . Drug Use: Not on file  . Sexual Activity: Not on file   Other Topics Concern  . Not on file   Social History Narrative   Work or School: Paediatric nurse [employer]      Home Situation: lives with husband      Spiritual Beliefs: Christian      Lifestyle: no regular exercise; diet good             Current outpatient prescriptions:calcium carbonate (OS-CAL) 600 MG TABS tablet, Take 600 mg by mouth 2 (two) times daily with a meal., Disp: , Rfl: ;  cholecalciferol (VITAMIN D) 1000 UNITS tablet, Take 1,000 Units by mouth daily., Disp: , Rfl: ;  Multiple Vitamin (MULTIVITAMIN) capsule, Take 1 capsule by mouth daily., Disp: , Rfl: ;  Omega-3 Fatty Acids (FISH OIL PO), Take by mouth., Disp: , Rfl:   EXAM:  Filed Vitals:   12/23/13 0825  BP: 98/66  Pulse: 66  Temp: 98.4 F (36.9 C)  Body mass index is 26.93 kg/(m^2).  GENERAL: vitals reviewed and listed below, alert, oriented, appears well hydrated and in no acute distress  HEENT: head atraumatic, PERRLA, normal appearance of eyes, ears, nose and  mouth. moist mucus membranes.  NECK: supple, no masses or lymphadenopathy  LUNGS: clear to auscultation bilaterally, no rales, rhonchi or wheeze  CV: HRRR, no peripheral edema or cyanosis, normal pedal pulses  BREAST: declined  ABDOMEN: bowel sounds normal, soft, non tender to palpation, no masses, no rebound or guarding  GU: declined  RECTAL: declined  SKIN: no rash or abnormal lesions  MS: normal gait, moves all extremities normally  NEURO: CN II-XII grossly intact, normal muscle strength and sensation to light touch on extremities  PSYCH: normal affect, pleasant and cooperative  ASSESSMENT AND PLAN:  Discussed the following assessment and  plan:  Visit for preventive health examination - Plan: Lipid Panel  Osteoporosis, unspecified -reviewed and provided Vit D and calcium dosing info -weight bearing exercise -DEXA in 2 years  Foot fracture, right -advised once podiatrist tells her it is ok - walking, elliptical, cycling, water aerobics and small weights would be good options if foot pain  -Discussed and advised all [place] preventive services health task force level A and B recommendations for age, sex and risks.  -Advised at least 150 minutes of exercise per week and a healthy diet low in saturated fats and sweets and consisting of fresh fruits and vegetables, lean meats such as fish and white chicken and whole grains.  -FASTING labs, studies and vaccines per orders this encounter  Orders Placed This Encounter  Procedures  . Lipid Panel    Patient Instructions  We recommend the following healthy lifestyle measures: - eat a healthy diet consisting of lots of vegetables, fruits, beans, nuts, seeds, healthy meats such as white chicken and fish and whole grains.  - avoid fried foods, fast food, processed foods, sodas, red meet and other fattening foods.  - get a least 150 minutes of aerobic exercise per week - consider cycling, elliptical, water aerobics, small weights  -We have ordered labs or studies at this visit. It can take up to 1-2 weeks for results and processing. We will contact you with instructions IF your results are abnormal. Normal results will be released to your [facility]. If you have not heard from [place] or can not find your results in [facility] in 2 weeks please contact our office.  Vitamin D3 2000 IU daily, calcium 1200mg  from diet and supplement daily    Patient advised to return to clinic immediately if symptoms worsen or persist or new concerns.   Return in about 1 year (around 12/24/2014), or if symptoms worsen or fail to improve.  Lucretia Kern

## 2014-06-08 ENCOUNTER — Encounter: Payer: Self-pay | Admitting: Family Medicine

## 2014-06-08 ENCOUNTER — Ambulatory Visit (INDEPENDENT_AMBULATORY_CARE_PROVIDER_SITE_OTHER): Payer: 59 | Admitting: Family Medicine

## 2014-06-08 VITALS — BP 102/78 | HR 60 | Temp 98.5°F | Ht 65.25 in | Wt 157.5 lb

## 2014-06-08 DIAGNOSIS — K625 Hemorrhage of anus and rectum: Secondary | ICD-10-CM

## 2014-06-08 DIAGNOSIS — K648 Other hemorrhoids: Secondary | ICD-10-CM

## 2014-06-08 MED ORDER — HYDROCORTISONE 2.5 % RE CREA: 1.0000 "application " | TOPICAL_CREAM | Freq: Two times a day (BID) | RECTAL | Status: DC

## 2014-06-08 NOTE — Progress Notes (Signed)
Pre visit review using our clinic review tool, if applicable. No additional management support is needed unless otherwise documented below in the visit note. 

## 2014-06-08 NOTE — Progress Notes (Signed)
No chief complaint on file.   HPI:   Acute visit for:  1)BRBPR: -very small amount of blood on TP after BM several days ago -had a few episodes of diarrhea for 2 days last week with upset stomach - no blood in stools at that time -she has had hemorrhoids in the past -no rectal pain, fevers, dysuria, vaginal symptoms - sure was not vaginal -had colonscopy a few years ago normal  ROS: See pertinent positives and negatives per HPI.  Past Medical History  Diagnosis Date  . Osteoporosis, unspecified 09/02/2013    Past Surgical History  Procedure Laterality Date  . Cholecystectomy    . Foot surgery      Family History  Problem Relation Age of Onset  . Mental illness Mother   . Cancer Father     bladder    History   Social History  . Marital Status: Married    Spouse Name: N/A    Number of Children: N/A  . Years of Education: N/A   Social History Main Topics  . Smoking status: Former Research scientist (life sciences)  . Smokeless tobacco: None     Comment: 1 ppd for 15 years, quit 20 years ago  . Alcohol Use: Yes     Comment: occ   . Drug Use: None  . Sexual Activity: None   Other Topics Concern  . 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      Spiritual Beliefs: Christian      Lifestyle: no regular exercise; diet good             Current outpatient prescriptions:calcium carbonate (OS-CAL) 600 MG TABS tablet, Take 600 mg by mouth 2 (two) times daily with a meal., Disp: , Rfl: ;  cholecalciferol (VITAMIN D) 1000 UNITS tablet, Take 1,000 Units by mouth daily., Disp: , Rfl: ;  Multiple Vitamin (MULTIVITAMIN) capsule, Take 1 capsule by mouth daily., Disp: , Rfl: ;  Omega-3 Fatty Acids (FISH OIL PO), Take by mouth., Disp: , Rfl:  hydrocortisone (PROCTOSOL HC) 2.5 % rectal cream, Place 1 application rectally 2 (two) times daily., Disp: 30 g, Rfl: 0  EXAM:  Filed Vitals:   06/08/14 1258  BP: 102/78  Pulse: 60   Temp: 98.5 F (36.9 C)    Body mass index is 26.02 kg/(m^2).  GENERAL: vitals reviewed and listed above, alert, oriented, appears well hydrated and in no acute distress  HEENT: atraumatic, conjunttiva clear, no obvious abnormalities on inspection of external nose and ears  NECK: no obvious masses on inspection  LUNGS: clear to auscultation bilaterally, no wheezes, rales or rhonchi, good air movement  CV: HRRR, no peripheral edema  RECTAL: declined  MS: moves all extremities without noticeable abnormality  PSYCH: pleasant and cooperative, no obvious depression or anxiety  ASSESSMENT AND PLAN:  Discussed the following assessment and plan:  Other hemorrhoids  BRBPR (bright red blood per rectum) - Plan: hydrocortisone (PROCTOSOL HC) 2.5 % rectal cream  -we discussed possible serious and likely etiologies, workup and treatment, treatment risks and return precautions - most likely skin tear or hemorrhoid -we advised rectal evaluation with anoscopy today but she opted to defer this as has not had any further symptoms the last few days -follow up advised in persists or other concerns -of course, we advised Jatoria  to return or notify a doctor immediately if symptoms worsen or persist or new concerns arise.   -  Patient advised to return or notify a doctor immediately if symptoms worsen or persist or new concerns arise.  There are no Patient Instructions on file for this visit.   Colin Benton R.

## 2015-02-12 ENCOUNTER — Ambulatory Visit (INDEPENDENT_AMBULATORY_CARE_PROVIDER_SITE_OTHER): Payer: 59 | Admitting: Podiatry

## 2015-02-12 ENCOUNTER — Encounter: Payer: Self-pay | Admitting: Podiatry

## 2015-02-12 ENCOUNTER — Ambulatory Visit (INDEPENDENT_AMBULATORY_CARE_PROVIDER_SITE_OTHER): Payer: 59

## 2015-02-12 DIAGNOSIS — M722 Plantar fascial fibromatosis: Secondary | ICD-10-CM

## 2015-02-12 DIAGNOSIS — R52 Pain, unspecified: Secondary | ICD-10-CM

## 2015-02-12 MED ORDER — DICLOFENAC SODIUM 75 MG PO TBEC: 75.0000 mg | DELAYED_RELEASE_TABLET | Freq: Two times a day (BID) | ORAL | Status: DC

## 2015-02-12 MED ORDER — TRIAMCINOLONE ACETONIDE 10 MG/ML IJ SUSP
10.0000 mg | Freq: Once | INTRAMUSCULAR | Status: AC
  Administered 2015-02-12: 10 mg

## 2015-02-12 NOTE — Patient Instructions (Signed)
Plantar Fasciitis  Plantar fasciitis is a common condition that causes foot pain. It is soreness (inflammation) of the band of tough fibrous tissue on the bottom of the foot that runs from the heel bone (calcaneus) to the ball of the foot. The cause of this soreness may be from excessive standing, poor fitting shoes, running on hard surfaces, being overweight, having an abnormal walk, or overuse (this is common in runners) of the painful foot or feet. It is also common in aerobic exercise dancers and ballet dancers.  SYMPTOMS   Most people with plantar fasciitis complain of:   Severe pain in the morning on the bottom of their foot especially when taking the first steps out of bed. This pain recedes after a few minutes of walking.   Severe pain is experienced also during walking following a long period of inactivity.   Pain is worse when walking barefoot or up stairs  DIAGNOSIS    Your caregiver will diagnose this condition by examining and feeling your foot.   Special tests such as X-rays of your foot, are usually not needed.  PREVENTION    Consult a sports medicine professional before beginning a new exercise program.   Walking programs offer a good workout. With walking there is a lower chance of overuse injuries common to runners. There is less impact and less jarring of the joints.   Begin all new exercise programs slowly. If problems or pain develop, decrease the amount of time or distance until you are at a comfortable level.   Wear good shoes and replace them regularly.   Stretch your foot and the heel cords at the back of the ankle (Achilles tendon) both before and after exercise.   Run or exercise on even surfaces that are not hard. For example, asphalt is better than pavement.   Do not run barefoot on hard surfaces.   If using a treadmill, vary the incline.   Do not continue to workout if you have foot or joint problems. Seek professional help if they do not improve.  HOME CARE INSTRUCTIONS     Avoid activities that cause you pain until you recover.   Use ice or cold packs on the problem or painful areas after working out.   Only take over-the-counter or prescription medicines for pain, discomfort, or fever as directed by your caregiver.   Soft shoe inserts or athletic shoes with air or gel sole cushions may be helpful.   If problems continue or become more severe, consult a sports medicine caregiver or your own health care provider. Cortisone is a potent anti-inflammatory medication that may be injected into the painful area. You can discuss this treatment with your caregiver.  MAKE SURE YOU:    Understand these instructions.   Will watch your condition.   Will get help right away if you are not doing well or get worse.  Document Released: 05/02/2001 Document Revised: 10/30/2011 Document Reviewed: 07/01/2008  Exit[employer] Patient Information 2015 [employer]. This information is not intended to replace advice given to you by your health care provider. Make sure you discuss any questions you have with your health care provider.

## 2015-02-12 NOTE — Progress Notes (Signed)
   Subjective:    Patient ID: Taylor Buchanan, female    DOB: Mar 28, 1956, 59 y.o.   MRN: [id number]  HPI PT STATED B/L RT HEEL/ARCH PAIN IS WORSE THAN THE LT FOOT FOR 1 MONTH. FEET ARE GETTING WORSE ESPECIALLY WHEN SITTING/WALKING. TRIED ICING, BOOT,STRETCHING AND INSERTS.   Review of Systems  Musculoskeletal: Positive for gait problem.       Objective:   Physical Exam        Assessment & Plan:

## 2015-02-13 NOTE — Progress Notes (Signed)
Subjective:     Patient ID: Taylor Buchanan, female   DOB: May 23, 1956, 59 y.o.   MRN: [id number]  HPI patient presents stating my right heel has been killing me and mild discomfort in the left heel for the last few months. I been trying to wear a walking boot with no current relief   Review of Systems  All other systems reviewed and are negative.      Objective:   Physical Exam  Constitutional: She is oriented to person, place, and time.  Musculoskeletal: Normal range of motion.  Neurological: She is oriented to person, place, and time.  Skin: Skin is warm.  Nursing note and vitals reviewed.  neurovascular status intact muscle strength adequate with quite a bit of discomfort in the right plantar heel at the insertional point tendon into the calcaneus with fluid buildup noted. Moderate depression of the arch is noted     Assessment:     Plantar fasciitis acute nature right over left with fluid buildup noted    Plan:     Discussed long-term orthotics and mechanics of her feet and reviewed x-rays and today I injected the right plantar fascia 3 mg Kenalog 5 mg Xylocaine and applied fascial brace and gave instructions on physical therapy

## 2015-02-19 ENCOUNTER — Encounter: Payer: Self-pay | Admitting: Podiatry

## 2015-02-19 ENCOUNTER — Ambulatory Visit (INDEPENDENT_AMBULATORY_CARE_PROVIDER_SITE_OTHER): Payer: 59 | Admitting: Podiatry

## 2015-02-19 VITALS — BP 95/60 | HR 60 | Resp 15

## 2015-02-19 DIAGNOSIS — M722 Plantar fascial fibromatosis: Secondary | ICD-10-CM

## 2015-02-19 MED ORDER — TRIAMCINOLONE ACETONIDE 10 MG/ML IJ SUSP
10.0000 mg | Freq: Once | INTRAMUSCULAR | Status: AC
  Administered 2015-02-19: 10 mg

## 2015-02-21 NOTE — Progress Notes (Signed)
Subjective:     Patient ID: Taylor Buchanan, female   DOB: [birth year], 59 y.o.   MRN: [id number]  HPI patient states I'm still having a lot of pain in my right heel with improvement that's moderate but it seems to be slightly more towards my arch   Review of Systems     Objective:   Physical Exam Neurovascular status intact with continued discomfort in the left plantar fascia with pain that has migrated more into the arch at this time    Assessment:     Acute plantar fasciitis with depression of the arch as a complicating factor to condition    Plan:     Reviewed condition and at this time injected the plantar fascial slightly more distal 3 mg Kenalog 5 mg Xylocaine and applied fascial brace that she had had previously. We scanned for custom orthotic devices and I reviewed them with her and shoe gear modifications to be obtained

## 2015-04-09 ENCOUNTER — Ambulatory Visit: Payer: 59 | Admitting: *Deleted

## 2015-04-09 DIAGNOSIS — M722 Plantar fascial fibromatosis: Secondary | ICD-10-CM

## 2015-04-09 NOTE — Patient Instructions (Signed)

## 2015-04-09 NOTE — Progress Notes (Signed)
Patient ID: Taylor Buchanan, female   DOB: February 06, 1956, 59 y.o.   MRN: [id number] Patient presents for orthotic pick up.  Verbal and written break in and wear instructions given.  Patient will follow up in 4 weeks if symptoms worsen or fail to improve.

## 2016-09-22 DIAGNOSIS — J019 Acute sinusitis, unspecified: Secondary | ICD-10-CM | POA: Diagnosis not present

## 2016-10-19 DIAGNOSIS — Z1231 Encounter for screening mammogram for malignant neoplasm of breast: Secondary | ICD-10-CM | POA: Diagnosis not present

## 2016-10-19 DIAGNOSIS — Z780 Asymptomatic menopausal state: Secondary | ICD-10-CM | POA: Diagnosis not present

## 2016-10-19 DIAGNOSIS — Z01419 Encounter for gynecological examination (general) (routine) without abnormal findings: Secondary | ICD-10-CM | POA: Diagnosis not present

## 2016-10-19 DIAGNOSIS — Z124 Encounter for screening for malignant neoplasm of cervix: Secondary | ICD-10-CM | POA: Diagnosis not present

## 2016-10-19 LAB — HM PAP SMEAR

## 2016-11-01 ENCOUNTER — Encounter: Payer: Self-pay | Admitting: Family Medicine

## 2016-12-25 ENCOUNTER — Ambulatory Visit (INDEPENDENT_AMBULATORY_CARE_PROVIDER_SITE_OTHER): Payer: 59 | Admitting: Family Medicine

## 2016-12-25 ENCOUNTER — Telehealth: Payer: Self-pay | Admitting: Family Medicine

## 2016-12-25 ENCOUNTER — Encounter: Payer: Self-pay | Admitting: Family Medicine

## 2016-12-25 VITALS — BP 100/50 | HR 60 | Temp 98.5°F | Ht 65.25 in | Wt 168.7 lb

## 2016-12-25 DIAGNOSIS — M62838 Other muscle spasm: Secondary | ICD-10-CM

## 2016-12-25 DIAGNOSIS — M25511 Pain in right shoulder: Secondary | ICD-10-CM | POA: Diagnosis not present

## 2016-12-25 MED ORDER — CYCLOBENZAPRINE HCL 5 MG PO TABS: 5.0000 mg | ORAL_TABLET | Freq: Every evening | ORAL | 0 refills | Status: DC | PRN

## 2016-12-25 NOTE — Progress Notes (Signed)
Pre visit review using our clinic review tool, if applicable. No additional management support is needed unless otherwise documented below in the visit note. 

## 2016-12-25 NOTE — Patient Instructions (Signed)
BEFORE YOU LEAVE: -follow up: with Dr. Maudie Mercury in 3-4 weeks -rotator cuff and upper back exercises  Alternate the exercises.  Heat, topical menthol (such as tiger balm) and or Aleve or tylenol as needed for the pain.  Can use the muscle relaxer at night or twice daily if it helps.  I hope you are feeling better soon! Seek care sooner if worsening, new concerns or you are not starting to improve with treatment.

## 2016-12-25 NOTE — Telephone Encounter (Signed)
Ok with me if current PCP agrees.

## 2016-12-25 NOTE — Telephone Encounter (Signed)
After today pt would like to transfer care from Dr. Maudie Mercury to [facility], due to closer to home. Please advise ok to schedule.

## 2016-12-25 NOTE — Progress Notes (Signed)
HPI:  Acute visit for R shoulder pain: -seems to have woken up with this -pain in R trap muscle area and some in upper arm, heres neck "pop" sometimes -heat helps -no radiation to lower arm, headache, fevers, malaise, sig weakness or numbness  ROS: See pertinent positives and negatives per HPI.  Past Medical History:  Diagnosis Date  . Osteoporosis, unspecified 09/02/2013    Past Surgical History:  Procedure Laterality Date  . CHOLECYSTECTOMY    . FOOT SURGERY      Family History  Problem Relation Age of Onset  . Mental illness Mother   . Cancer Father     bladder    Social History   Social History  . Marital status: Married    Spouse name: N/A  . Number of children: N/A  . Years of education: N/A   Social History Main Topics  . Smoking status: Former Research scientist (life sciences)  . Smokeless tobacco: Never Used     Comment: 1 ppd for 15 years, quit 20 years ago  . Alcohol use Yes     Comment: occ   . Drug use: Unknown  . Sexual activity: Not Asked   Other Topics Concern  . 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  calcium carbonate (OS-CAL) 600 MG TABS tablet, Take 600 mg by mouth 2 (two) times daily with a meal., Disp: , Rfl:  .  cholecalciferol (VITAMIN D) 1000 UNITS tablet, Take 1,000 Units by mouth daily., Disp: , Rfl:  .  hydrocortisone (PROCTOSOL HC) 2.5 % rectal cream, Place 1 application rectally 2 (two) times daily., Disp: 30 g, Rfl: 0 .  Multiple Vitamin (MULTIVITAMIN) capsule, Take 1 capsule by mouth daily., Disp: , Rfl:  .  Omega-3 Fatty Acids (FISH OIL PO), Take by mouth., Disp: , Rfl:  .  cyclobenzaprine (FLEXERIL) 5 MG tablet, Take 1 tablet (5 mg total) by mouth at bedtime as needed for muscle spasms., Disp: 30 tablet, Rfl: 0  EXAM:  Vitals:   12/25/16 1433  BP: (!) 100/50  Pulse: 60  Temp: 98.5 F (36.9 C)    Body mass index is 27.86 kg/m.  GENERAL: vitals reviewed and listed above, alert, oriented, appears well hydrated and in no acute distress  HEENT: atraumatic, conjunttiva clear, no obvious abnormalities on inspection of external nose and ears  NECK: no obvious masses on inspection  LUNGS: clear to auscultation bilaterally, no wheezes, rales or rhonchi, good air movement  CV: HRRR, no peripheral edema  MS: moves all extremities without noticeable abnormality, scoliosis of the back, R trap muscle tension and some TTP, R RTC attachment to humerus with some TTP, normal ROM head/neck/arm, normal strength/sensitivity to light touch bilat upper ext, neg shaw, neg empty can, mildly + R impingement test, neg spurling, NV intact distal  PSYCH: pleasant and cooperative, no obvious depression or anxiety  ASSESSMENT AND PLAN:  Discussed the following assessment and plan:  Trapezius muscle spasm  Acute pain of right shoulder  -we discussed possible serious and likely etiologies, workup and treatment, treatment risks and return precautions - supect trap muscle spasm, ? R RTC pathology, vs other -after this discussion, Corryn opted for HEP, muscle relaxer after  discuss risks, OTC options for pain -follow up advised 3-4 weeks (note: she is considering transfer to another location due to travel distance - but wanted to follow up here for this) -of course, we advised Aparna  to return or notify a doctor immediately if symptoms worsen or persist or new concerns arise.   Patient Instructions  BEFORE YOU LEAVE: -follow up: with Dr. Maudie Mercury in 3-4 weeks -rotator cuff and upper back exercises  Alternate the exercises.  Heat, topical menthol (such as tiger balm) and or Aleve or tylenol as needed for the pain.  Can use the muscle relaxer at night or twice daily if it helps.  I hope you are feeling better soon! Seek care sooner if  worsening, new concerns or you are not starting to improve with treatment.      Colin Benton R., DO

## 2016-12-25 NOTE — Telephone Encounter (Signed)
[place] with me. Today she said she wanted to follow up here for next visit. Ok either way.

## 2016-12-26 NOTE — Telephone Encounter (Signed)
Called pt and LMOVM to return call.  °

## 2017-06-18 ENCOUNTER — Telehealth: Payer: Self-pay | Admitting: Family Medicine

## 2017-06-18 NOTE — Telephone Encounter (Signed)
  We haven't seen her in many months. I would recommend an appointment here for evaluation this week so that we can find out what is going on and place an appropriate referral if needed.

## 2017-06-18 NOTE — Telephone Encounter (Signed)
° ° °  Pt said she is still having neck pain and would like a referral to see someone about the neck pain. She said she would like to see someone real soon

## 2017-06-18 NOTE — Telephone Encounter (Signed)
I called the pt and informed her of the message below and she stated she will figure out another way to see a specialist as this is what she prefers.  Message sent to Dr Maudie Mercury as Juluis Rainier.

## 2017-06-19 NOTE — Telephone Encounter (Signed)
I called the pt and informed her of the message below and she stated she does not know of a particular doctor and scheduled an appt to see Dr Maudie Mercury to discuss this. (appt 11/5)

## 2017-06-19 NOTE — Telephone Encounter (Signed)
Does she know who she wants to see? Most specialists are specialized in one particular area. I had advised an appointment so that we could figure out where this issue is coming from and then determine who she needed to see. If she knows who she wants a referral to, I am happy to refer her to them.

## 2017-06-24 NOTE — Progress Notes (Signed)
HPI:  Due for hep c screening, flu vaccine  Acute visit for neck pain/right shoulder pain: -chronic for several months -wants referral to specialist -She felt like some of the things we gave her back in May helped, but her symptoms persisted and she just didn't follow up -She has pain in the right lower neck and trapezius muscle region, stating, this is a constant soreness, worse with some activities -No radiation to her arms, weakness, numbness, fevers or malaise  New acute onset of left low back pain: -Started 3 days ago after she moved some furniture -Moderate to severe pain in the left low back Denies radiation, fevers, malaise, weakness or numbness, bowel or bladder incontinence  ROS: See pertinent positives and negatives per HPI.  Past Medical History:  Diagnosis Date  . Osteoporosis, unspecified 09/02/2013    Past Surgical History:  Procedure Laterality Date  . CHOLECYSTECTOMY    . FOOT SURGERY      Family History  Problem Relation Age of Onset  . Mental illness Mother   . Cancer Father        bladder    Social History   Socioeconomic History  . Marital status: Married    Spouse name: None  . Number of children: None  . Years of education: None  . Highest education level: None  Social Needs  . Financial resource strain: None  . Food insecurity - worry: None  . Food insecurity - inability: None  . Transportation needs - medical: None  . Transportation needs - non-medical: None  Occupational History  . None  Tobacco Use  . Smoking status: Former Research scientist (life sciences)  . Smokeless tobacco: Never Used  . Tobacco comment: 1 ppd for 15 years, quit 20 years ago  Substance and Sexual Activity  . Alcohol use: Yes    Comment: occ   . Drug use: None  . Sexual activity: None  Other Topics Concern  . None  Social History Narrative   Work or School: Runner, broadcasting/film/video Situation: lives with husband      Spiritual Beliefs: Christian      Lifestyle: no  regular exercise; diet good              Current Outpatient Medications:  .  calcium carbonate (OS-CAL) 600 MG TABS tablet, Take 600 mg by mouth 2 (two) times daily with a meal., Disp: , Rfl:  .  cholecalciferol (VITAMIN D) 1000 UNITS tablet, Take 1,000 Units by mouth daily., Disp: , Rfl:  .  cyclobenzaprine (FLEXERIL) 5 MG tablet, Take 1 tablet (5 mg total) by mouth at bedtime as needed for muscle spasms., Disp: 30 tablet, Rfl: 0 .  hydrocortisone (PROCTOSOL HC) 2.5 % rectal cream, Place 1 application rectally 2 (two) times daily., Disp: 30 g, Rfl: 0 .  Multiple Vitamin (MULTIVITAMIN) capsule, Take 1 capsule by mouth daily., Disp: , Rfl:  .  Omega-3 Fatty Acids (FISH OIL PO), Take by mouth., Disp: , Rfl:  .  cyclobenzaprine (FLEXERIL) 5 MG tablet, Take 1 tablet (5 mg total) at bedtime as needed by mouth for muscle spasms., Disp: 20 tablet, Rfl: 0  EXAM:  Vitals:   06/25/17 0907  BP: (!) 102/58  Pulse: 67  Temp: 99.7 F (37.6 C)    Body mass index is 26.8 kg/m.  GENERAL: vitals reviewed and listed above, alert, oriented, appears well hydrated and in no acute distress  HEENT: atraumatic, conjunttiva clear, no obvious abnormalities on inspection of  external nose and ears  NECK: no obvious masses on inspection  LUNGS: clear to auscultation bilaterally, no wheezes, rales or rhonchi, good air movement  CV: HRRR, no peripheral edema  MS: moves all extremities without noticeable abnormality, she has scoliosis of the back, her right shoulder is much higher than the left, she has normal strength, sensitivity to light touch and reflexes in the upper and lower extremities bilaterally, she has no bony tenderness to palpation along the cervical thoracic and lumbar spine, T1-4 rotated right side bent left, L3-5 rotated left side bent right, she has a right trapezius counter strain tender point, and left lower lumbar tender points as well. Vertebral artery insufficiency testing and Spurling  test negative.  PSYCH: pleasant and cooperative, no obvious depression or anxiety  ASSESSMENT AND PLAN:  Discussed the following assessment and plan:  Chronic right shoulder pain - Plan: DG Cervical Spine Complete  Acute left-sided low back pain without sciatica  Scoliosis, unspecified scoliosis type, unspecified spinal region  Somatic dysfunction of thoracic region  Somatic dysfunction of lumbar region  -we discussed possible serious and likely etiologies, workup and treatment, treatment risks and return precautions -after this discussion, Taylor Buchanan opted for: -Gentle osteopathic treatments for the thoracic and lumbar region today per her wishes after discussion - see below -Muscle relaxer at night, heat and over-the-counter analgesics as needed for pain -Cervical x-rays -Home exercises -follow up advised to to 4 weeks -of course, we advised Taylor Buchanan  to return or notify a doctor immediately if symptoms worsen or persist or new concerns arise.  PROCEDURE NOTE : OSTEOPATHIC TREATMENT The decision today to treat with gentle Osteopathic Manipulative Therapy  (OMT) was based on physical exam findings. Verbal consent was  obtained after after explanation of risks and benefits. No Cervical HVLA manipulation was performed. After consent was obtained, treatment was  performed as below:      Regions treated: Thoracic, lumbar      Techniques used: Counterstrain  The patient tolerated the treatment well and reported Improved  symptoms following treatment today. Follow up treatment was advised in: 1-2 weeks    Patient Instructions  BEFORE YOU LEAVE: -xray sheet -follow up: 2- 4 weeks (OMT visit if she wishes)  Get the xray.  Heat and flexeril per instructions as needed for pain. Topical menthol (tiger balm) and aleve per instructions as needed.  Try the video below: A good intro video is: "Independence from Pain 7-minute Video" -  [email or web address]     Colin Benton R., DO

## 2017-06-25 ENCOUNTER — Ambulatory Visit (INDEPENDENT_AMBULATORY_CARE_PROVIDER_SITE_OTHER): Payer: 59 | Admitting: Family Medicine

## 2017-06-25 ENCOUNTER — Encounter: Payer: Self-pay | Admitting: Family Medicine

## 2017-06-25 ENCOUNTER — Ambulatory Visit (INDEPENDENT_AMBULATORY_CARE_PROVIDER_SITE_OTHER)
Admission: RE | Admit: 2017-06-25 | Discharge: 2017-06-25 | Disposition: A | Discharge status: Home / Self Care | Payer: 59 | Source: Ambulatory Visit | Attending: Family Medicine | Admitting: Family Medicine

## 2017-06-25 VITALS — BP 102/58 | HR 67 | Temp 99.7°F | Ht 65.25 in | Wt 162.3 lb

## 2017-06-25 DIAGNOSIS — M545 Low back pain, unspecified: Secondary | ICD-10-CM

## 2017-06-25 DIAGNOSIS — M9903 Segmental and somatic dysfunction of lumbar region: Secondary | ICD-10-CM | POA: Diagnosis not present

## 2017-06-25 DIAGNOSIS — M25511 Pain in right shoulder: Secondary | ICD-10-CM

## 2017-06-25 DIAGNOSIS — M4802 Spinal stenosis, cervical region: Secondary | ICD-10-CM | POA: Diagnosis not present

## 2017-06-25 DIAGNOSIS — G8929 Other chronic pain: Secondary | ICD-10-CM

## 2017-06-25 DIAGNOSIS — M9902 Segmental and somatic dysfunction of thoracic region: Secondary | ICD-10-CM | POA: Diagnosis not present

## 2017-06-25 DIAGNOSIS — M419 Scoliosis, unspecified: Secondary | ICD-10-CM | POA: Diagnosis not present

## 2017-06-25 MED ORDER — CYCLOBENZAPRINE HCL 5 MG PO TABS: 5.0000 mg | ORAL_TABLET | Freq: Every evening | ORAL | 0 refills | Status: DC | PRN

## 2017-06-25 NOTE — Patient Instructions (Addendum)
BEFORE YOU LEAVE: -xray sheet -follow up: 2- 4 weeks (OMT visit if she wishes)  Get the xray.  Heat and flexeril per instructions as needed for pain. Topical menthol (tiger balm) and aleve per instructions as needed.  Try the video below: A good intro video is: "Independence from Pain 7-minute Video" - [email or web address].com

## 2017-08-08 ENCOUNTER — Ambulatory Visit (INDEPENDENT_AMBULATORY_CARE_PROVIDER_SITE_OTHER): Payer: 59

## 2017-08-08 ENCOUNTER — Ambulatory Visit (INDEPENDENT_AMBULATORY_CARE_PROVIDER_SITE_OTHER): Payer: 59 | Admitting: Podiatry

## 2017-08-08 ENCOUNTER — Encounter: Payer: Self-pay | Admitting: Podiatry

## 2017-08-08 ENCOUNTER — Other Ambulatory Visit: Payer: Self-pay | Admitting: Podiatry

## 2017-08-08 DIAGNOSIS — M79672 Pain in left foot: Secondary | ICD-10-CM

## 2017-08-08 DIAGNOSIS — M779 Enthesopathy, unspecified: Secondary | ICD-10-CM

## 2017-08-08 MED ORDER — TRIAMCINOLONE ACETONIDE 10 MG/ML IJ SUSP
10.0000 mg | Freq: Once | INTRAMUSCULAR | Status: AC
  Administered 2017-08-08: 10 mg

## 2017-08-08 NOTE — Progress Notes (Signed)
Subjective:   Patient ID: [name], female   DOB: 61 y.o.   MRN: [id number]   HPI Patient presents stating over the last 6 months that had to walk more and I get pain in my ankle left over right and I wear my orthotics at times   ROS      Objective:  Physical Exam  Neurovascular status intact with inflammation that right now is more in the sinus tarsi left with inflammation with some prominence around the medial side bruit had been previous posterior tibial repair at the navicular bone     Assessment:  Inflammatory sinus tarsi is left with moderate depression of the arch with mild posterior tibial-like symptoms left     Plan:  H&P both conditions reviewed and today I injected the sinus tarsi left 3 mg Kenalog 5 mg Xylocaine advised on continued support and dispense fascial brace to lift the arch.  Continue orthotics and bring them in at next visit for me to reevaluate  X-rays indicated no change in the height of the arch with some prominence of the navicular and no indication subtalar joint arthritis

## 2017-08-29 ENCOUNTER — Ambulatory Visit: Payer: 59 | Admitting: Podiatry

## 2017-08-30 ENCOUNTER — Ambulatory Visit (INDEPENDENT_AMBULATORY_CARE_PROVIDER_SITE_OTHER): Payer: 59 | Admitting: Podiatry

## 2017-08-30 ENCOUNTER — Encounter: Payer: Self-pay | Admitting: Podiatry

## 2017-08-30 DIAGNOSIS — M779 Enthesopathy, unspecified: Secondary | ICD-10-CM | POA: Diagnosis not present

## 2017-08-30 NOTE — Patient Instructions (Signed)

## 2017-08-30 NOTE — Progress Notes (Signed)
Subjective:   Patient ID: Taylor Buchanan, female   DOB: 62 y.o.   MRN: [id number]   HPI Patient presents stating that the ankle has improved a lot but there continues to be chronic pain on the inside of the left ankle   ROS      Objective:  Physical Exam  Neurovascular status intact with continued inflammation of the posterior tib complex near its insertion navicular with previous correction of this with flatfoot deformity as precipitating factor and diminishment of discomfort in the sinus tarsi     Assessment:  Inflammatory capsulitis with tendinitis condition with flatfoot deformity as precipitating factor     Plan:  H&P condition reviewed and explained at great length and at this point I involved the ped orthotist and we went ahead and scan for customized orthotics to reduce plantar pressure on the posterior tibial tendon and to lift the medial arch of bilateral.  Patient will have a second pair made for dress shoes but not to we know the first pair is comfortable and today shoe was casted and was seen back by [employer]

## 2017-08-30 NOTE — Progress Notes (Signed)
Patient cast for semi-rigid f/o hug arch, deep heel seat.  Two pair: one athletic/sport spenco cover; second 3/4 slim dress vinyl cover over 1/16 poron.  Dr. Paulla Dolly please drop charges on first pair; patient will pay $198 for second pair when dispensed.

## 2017-09-20 ENCOUNTER — Other Ambulatory Visit: Payer: 59 | Admitting: Orthotics

## 2017-09-24 ENCOUNTER — Other Ambulatory Visit: Payer: 59 | Admitting: Orthotics

## 2017-10-01 ENCOUNTER — Ambulatory Visit (INDEPENDENT_AMBULATORY_CARE_PROVIDER_SITE_OTHER): Payer: 59 | Admitting: Orthotics

## 2017-10-01 DIAGNOSIS — M779 Enthesopathy, unspecified: Secondary | ICD-10-CM

## 2017-10-01 DIAGNOSIS — M722 Plantar fascial fibromatosis: Secondary | ICD-10-CM

## 2017-10-01 NOTE — Progress Notes (Signed)
Patient came in today to pick up custom made foot orthotics.  The goals were accomplished and the patient reported no dissatisfaction with said orthotics.  Patient was advised of breakin period and how to report any issues.  Paid 199.00 for second pair.

## 2017-10-29 ENCOUNTER — Ambulatory Visit (INDEPENDENT_AMBULATORY_CARE_PROVIDER_SITE_OTHER): Payer: 59 | Admitting: Family Medicine

## 2017-10-29 ENCOUNTER — Encounter: Payer: Self-pay | Admitting: Family Medicine

## 2017-10-29 VITALS — BP 126/72 | HR 73 | Temp 99.2°F | Ht 66.5 in | Wt 159.0 lb

## 2017-10-29 DIAGNOSIS — H6123 Impacted cerumen, bilateral: Secondary | ICD-10-CM | POA: Diagnosis not present

## 2017-10-29 DIAGNOSIS — J0100 Acute maxillary sinusitis, unspecified: Secondary | ICD-10-CM | POA: Diagnosis not present

## 2017-10-29 MED ORDER — FLUTICASONE PROPIONATE 50 MCG/ACT NA SUSP
1.0000 | Freq: Every day | NASAL | 0 refills | Status: DC
Start: 2017-10-29 — End: 2021-03-15

## 2017-10-29 MED ORDER — AMOXICILLIN 500 MG PO CAPS: 500.0000 mg | ORAL_CAPSULE | Freq: Two times a day (BID) | ORAL | 0 refills | Status: AC

## 2017-10-29 NOTE — Progress Notes (Signed)
Subjective:    Patient ID: Taylor Buchanan, female    DOB: 19-Mar-1956, 62 y.o.   MRN: [id number]  Chief Complaint  Patient presents with  . Fever    HPI Patient was seen today for ongoing concern.  Pt endorses HA, achy, fever Tmax 100, L sided facial pressure on and off since Jan 25th.  Pt denies sore throat, rhinorrhea, and cough.  Pt will feel better for a few days, then have symptoms return.  Pt endorses her husband staying sick as well.  Pt has taken sudafed and mucinex with little relief.  Past Medical History:  Diagnosis Date  . Osteoporosis, unspecified 09/02/2013    No Known Allergies  ROS General: Denies fever, chills, night sweats, changes in weight, changes in appetite  +fever, achy HEENT: Denies, ear pain, changes in vision, rhinorrhea, sore throat  +HA, sinus pressure CV: Denies CP, palpitations, SOB, orthopnea Pulm: Denies SOB, cough, wheezing GI: Denies abdominal pain, nausea, vomiting, diarrhea, constipation GU: Denies dysuria, hematuria, frequency, vaginal discharge Msk: Denies muscle cramps, joint pains Neuro: Denies weakness, numbness, tingling Skin: Denies rashes, bruising Psych: Denies depression, anxiety, hallucinations     Objective:    Blood pressure 126/72, pulse 73, temperature 99.2 F (37.3 C), temperature source Oral, height 5' 6.5" (1.689 m), weight 159 lb (72.1 kg), last menstrual period 08/21/2002, SpO2 96 %.   Gen. Pleasant, well-nourished, in no distress, normal affect   HEENT: [place]/AT, face symmetric, no scleral icterus, PERRLA, nares patent without drainage, TTP of L maxillary sinus, pharynx without erythema or exudate.  Cerumen occluding b/l canals.  TMs full b/l after irrigation.  No cervical lymphadenopathy. Lungs: no accessory muscle use, CTAB, no wheezes or rales Cardiovascular: RRR, no m/r/g, no peripheral edema Abdomen: BS present, soft, NT/ND Neuro:  A&Ox3, CN II-XII intact, normal gait   Wt Readings from Last 3 Encounters:  10/29/17  159 lb (72.1 kg)  06/25/17 162 lb 4.8 oz (73.6 kg)  12/25/16 168 lb 11.2 oz (76.5 kg)    Lab Results  Component Value Date   GLUCOSE 81 11/03/2013   CHOL 148 12/23/2013   TRIG 90.0 12/23/2013   HDL 48.10 12/23/2013   LDLCALC 82 12/23/2013   ALT 12 11/03/2013   AST 23 11/03/2013   NA 139 11/03/2013   K 4.3 11/03/2013   CL 104 11/03/2013   CREATININE 0.70 11/03/2013   BUN 15 11/03/2013   CO2 28 11/03/2013    Assessment/Plan:  Subacute maxillary sinusitis  -given handout - Plan: amoxicillin (AMOXIL) 500 MG capsule, fluticasone (FLONASE) 50 MCG/ACT nasal spray  Bilateral impacted cerumen -consent obtained.  Ears irrigated.  Pt tolerated procedure.  F/u prn  Grier Mitts, MD

## 2017-10-29 NOTE — Patient Instructions (Addendum)
Earwax Buildup, Adult The ears produce a substance called earwax that helps keep bacteria out of the ear and protects the skin in the ear canal. Occasionally, earwax can build up in the ear and cause discomfort or hearing loss. What increases the risk? This condition is more likely to develop in people who:  Are female.  Are elderly.  Naturally produce more earwax.  Clean their ears often with cotton swabs.  Use earplugs often.  Use in-ear headphones often.  Wear hearing aids.  Have narrow ear canals.  Have earwax that is overly thick or sticky.  Have eczema.  Are dehydrated.  Have excess hair in the ear canal.  What are the signs or symptoms? Symptoms of this condition include:  Reduced or muffled hearing.  A feeling of fullness in the ear or feeling that the ear is plugged.  Fluid coming from the ear.  Ear pain.  Ear itch.  Ringing in the ear.  Coughing.  An obvious piece of earwax that can be seen inside the ear canal.  How is this diagnosed? This condition may be diagnosed based on:  Your symptoms.  Your medical history.  An ear exam. During the exam, your health care provider will look into your ear with an instrument called an otoscope.  You may have tests, including a hearing test. How is this treated? This condition may be treated by:  Using ear drops to soften the earwax.  Having the earwax removed by a health care provider. The health care provider may: ? Flush the ear with water. ? Use an instrument that has a loop on the end (curette). ? Use a suction device.  Surgery to remove the wax buildup. This may be done in severe cases.  Follow these instructions at home:  Take over-the-counter and prescription medicines only as told by your health care provider.  Do not put any objects, including cotton swabs, into your ear. You can clean the opening of your ear canal with a washcloth or facial tissue.  Follow instructions from your health  care provider about cleaning your ears. Do not over-clean your ears.  Drink enough fluid to keep your urine clear or pale yellow. This will help to thin the earwax.  Keep all follow-up visits as told by your health care provider. If earwax builds up in your ears often or if you use hearing aids, consider seeing your health care provider for routine, preventive ear cleanings. Ask your health care provider how often you should schedule your cleanings.  If you have hearing aids, clean them according to instructions from the manufacturer and your health care provider. Contact a health care provider if:  You have ear pain.  You develop a fever.  You have blood, pus, or other fluid coming from your ear.  You have hearing loss.  You have ringing in your ears that does not go away.  Your symptoms do not improve with treatment.  You feel like the room is spinning (vertigo). Summary  Earwax can build up in the ear and cause discomfort or hearing loss.  The most common symptoms of this condition include reduced or muffled hearing and a feeling of fullness in the ear or feeling that the ear is plugged.  This condition may be diagnosed based on your symptoms, your medical history, and an ear exam.  This condition may be treated by using ear drops to soften the earwax or by having the earwax removed by a health care provider.  Do   not put any objects, including cotton swabs, into your ear. You can clean the opening of your ear canal with a washcloth or facial tissue. This information is not intended to replace advice given to you by your health care provider. Make sure you discuss any questions you have with your health care provider. Document Released: 09/14/2004 Document Revised: 10/18/2016 Document Reviewed: 10/18/2016 Elsevier Interactive Patient Education  2018 [employer].  Sinusitis, Adult Sinusitis is soreness and inflammation of your sinuses. Sinuses are hollow spaces in the bones  around your face. Your sinuses are located:  Around your eyes.  In the middle of your forehead.  Behind your nose.  In your cheekbones.  Your sinuses and nasal passages are lined with a stringy fluid (mucus). Mucus normally drains out of your sinuses. When your nasal tissues become inflamed or swollen, the mucus can become trapped or blocked so air cannot flow through your sinuses. This allows bacteria, viruses, and funguses to grow, which leads to infection. Sinusitis can develop quickly and last for 7?10 days (acute) or for more than 12 weeks (chronic). Sinusitis often develops after a cold. What are the causes? This condition is caused by anything that creates swelling in the sinuses or stops mucus from draining, including:  Allergies.  Asthma.  Bacterial or viral infection.  Abnormally shaped bones between the nasal passages.  Nasal growths that contain mucus (nasal polyps).  Narrow sinus openings.  Pollutants, such as chemicals or irritants in the air.  A foreign object stuck in the nose.  A fungal infection. This is rare.  What increases the risk? The following factors may make you more likely to develop this condition:  Having allergies or asthma.  Having had a recent cold or respiratory tract infection.  Having structural deformities or blockages in your nose or sinuses.  Having a weak immune system.  Doing a lot of swimming or diving.  Overusing nasal sprays.  Smoking.  What are the signs or symptoms? The main symptoms of this condition are pain and a feeling of pressure around the affected sinuses. Other symptoms include:  Upper toothache.  Earache.  Headache.  Bad breath.  Decreased sense of smell and taste.  A cough that may get worse at night.  Fatigue.  Fever.  Thick drainage from your nose. The drainage is often green and it may contain pus (purulent).  Stuffy nose or congestion.  Postnasal drip. This is when extra mucus collects  in the throat or back of the nose.  Swelling and warmth over the affected sinuses.  Sore throat.  Sensitivity to light.  How is this diagnosed? This condition is diagnosed based on symptoms, a medical history, and a physical exam. To find out if your condition is acute or chronic, your health care provider may:  Look in your nose for signs of nasal polyps.  Tap over the affected sinus to check for signs of infection.  View the inside of your sinuses using an imaging device that has a light attached (endoscope).  If your health care provider suspects that you have chronic sinusitis, you may also:  Be tested for allergies.  Have a sample of mucus taken from your nose (nasal culture) and checked for bacteria.  Have a mucus sample examined to see if your sinusitis is related to an allergy.  If your sinusitis does not respond to treatment and it lasts longer than 8 weeks, you may have an MRI or CT scan to check your sinuses. These scans also  help to determine how severe your infection is. In rare cases, a bone biopsy may be done to rule out more serious types of fungal sinus disease. How is this treated? Treatment for sinusitis depends on the cause and whether your condition is chronic or acute. If a virus is causing your sinusitis, your symptoms will go away on their own within 10 days. You may be given medicines to relieve your symptoms, including:  Topical nasal decongestants. They shrink swollen nasal passages and let mucus drain from your sinuses.  Antihistamines. These drugs block inflammation that is triggered by allergies. This can help to ease swelling in your nose and sinuses.  Topical nasal corticosteroids. These are nasal sprays that ease inflammation and swelling in your nose and sinuses.  Nasal saline washes. These rinses can help to get rid of thick mucus in your nose.  If your condition is caused by bacteria, you will be given an antibiotic medicine. If your condition  is caused by a fungus, you will be given an antifungal medicine. Surgery may be needed to correct underlying conditions, such as narrow nasal passages. Surgery may also be needed to remove polyps. Follow these instructions at home: Medicines  Take, use, or apply over-the-counter and prescription medicines only as told by your health care provider. These may include nasal sprays.  If you were prescribed an antibiotic medicine, take it as told by your health care provider. Do not stop taking the antibiotic even if you start to feel better. Hydrate and Humidify  Drink enough water to keep your urine clear or pale yellow. Staying hydrated will help to thin your mucus.  Use a cool mist humidifier to keep the humidity level in your home above 50%.  Inhale steam for 10-15 minutes, 3-4 times a day or as told by your health care provider. You can do this in the bathroom while a hot shower is running.  Limit your exposure to cool or dry air. Rest  Rest as much as possible.  Sleep with your head raised (elevated).  Make sure to get enough sleep each night. General instructions  Apply a warm, moist washcloth to your face 3-4 times a day or as told by your health care provider. This will help with discomfort.  Wash your hands often with soap and water to reduce your exposure to viruses and other germs. If soap and water are not available, use hand sanitizer.  Do not smoke. Avoid being around people who are smoking (secondhand smoke).  Keep all follow-up visits as told by your health care provider. This is important. Contact a health care provider if:  You have a fever.  Your symptoms get worse.  Your symptoms do not improve within 10 days. Get help right away if:  You have a severe headache.  You have persistent vomiting.  You have pain or swelling around your face or eyes.  You have vision problems.  You develop confusion.  Your neck is stiff.  You have trouble  breathing. This information is not intended to replace advice given to you by your health care provider. Make sure you discuss any questions you have with your health care provider. Document Released: 08/07/2005 Document Revised: 04/02/2016 Document Reviewed: 06/02/2015 Elsevier Interactive Patient Education  [employer].

## 2017-12-11 DIAGNOSIS — Z1231 Encounter for screening mammogram for malignant neoplasm of breast: Secondary | ICD-10-CM | POA: Diagnosis not present

## 2017-12-11 DIAGNOSIS — Z01419 Encounter for gynecological examination (general) (routine) without abnormal findings: Secondary | ICD-10-CM | POA: Diagnosis not present

## 2017-12-11 DIAGNOSIS — Z780 Asymptomatic menopausal state: Secondary | ICD-10-CM | POA: Diagnosis not present

## 2019-04-11 ENCOUNTER — Encounter: Payer: 59 | Admitting: Family Medicine

## 2019-04-17 ENCOUNTER — Encounter: Payer: 59 | Admitting: Family Medicine

## 2019-04-18 ENCOUNTER — Ambulatory Visit (INDEPENDENT_AMBULATORY_CARE_PROVIDER_SITE_OTHER): Payer: 59 | Admitting: Family Medicine

## 2019-04-18 ENCOUNTER — Encounter: Payer: Self-pay | Admitting: Family Medicine

## 2019-04-18 ENCOUNTER — Other Ambulatory Visit: Payer: Self-pay

## 2019-04-18 VITALS — BP 118/72 | HR 86 | Temp 98.0°F | Wt 166.0 lb

## 2019-04-18 DIAGNOSIS — M4125 Other idiopathic scoliosis, thoracolumbar region: Secondary | ICD-10-CM | POA: Diagnosis not present

## 2019-04-18 DIAGNOSIS — Z23 Encounter for immunization: Secondary | ICD-10-CM

## 2019-04-18 DIAGNOSIS — K219 Gastro-esophageal reflux disease without esophagitis: Secondary | ICD-10-CM | POA: Diagnosis not present

## 2019-04-18 DIAGNOSIS — M810 Age-related osteoporosis without current pathological fracture: Secondary | ICD-10-CM | POA: Diagnosis not present

## 2019-04-18 DIAGNOSIS — M419 Scoliosis, unspecified: Secondary | ICD-10-CM

## 2019-04-18 DIAGNOSIS — M7061 Trochanteric bursitis, right hip: Secondary | ICD-10-CM | POA: Diagnosis not present

## 2019-04-18 HISTORY — DX: Scoliosis, unspecified: M41.9

## 2019-04-18 MED ORDER — OMEPRAZOLE 20 MG PO CPDR: 20.0000 mg | DELAYED_RELEASE_CAPSULE | Freq: Every day | ORAL | 3 refills | Status: DC

## 2019-04-18 NOTE — Patient Instructions (Addendum)
Please return in 3 months for your annual complete physical; please come fasting.  Today you were given your flu vaccination.   We will call you with information regarding your referral appointment. [facility]   Take the omeprazole daily for 2-6 weeks. It should resolve your heartburn symptoms.  It was a pleasure meeting you today! Thank you for choosing [place] to meet your healthcare needs! I truly look forward to working with you. If you have any questions or concerns, please send me a message via Mychart or call the office at [phone].  You had a steroid injection today.   Things to be aware of after this injection are listed below:  You may experience no significant improvement or even a slight worsening in your symptoms during the first 24 to 48 hours.  After that we expect your symptoms to improve gradually over the next 2 weeks for the medicine to have its maximal effect.  You should continue to have improvement out to 6 weeks after your injection.  I recommend icing the site of the injection for 20 minutes  1-2 times the day of your injection  You may shower but no swimming, tub bath or Jacuzzi for 24 hours.  If your bandage falls off this does not need to be replaced.  It is appropriate to remove the bandage after 4 hours.  You may resume light activities as tolerated.     POSSIBLE PROCEDURE SIDE EFFECTS: The side effects of the injection are usually fairly minimal however if you may experience some of the following side effects that are usually self-limited and will is off on their own.  If you are concerned please feel free to call the office with questions:             Increased numbness or tingling             Nausea or vomiting             Swelling or bruising at the injection site    Please call our office if if you experience any of the following symptoms over the next week as these can be signs of infection:              Fever greater than  100.12F             Significant swelling at the injection site             Significant redness or drainage from the injection site     Hip Bursitis  Hip bursitis is inflammation of a fluid-filled sac (bursa) in the hip joint. The bursa prevents the bones in the hip joint from rubbing against each other. Hip bursitis can cause mild to moderate pain, and symptoms often come and go over time. What are the causes? This condition may be caused by:  Injury to the hip.  Overuse of the muscles that surround the hip joint.  Previous injury or surgery of the hip.  Arthritis or gout.  Diabetes.  Thyroid disease.  Infection. In some cases, the cause may not be known. What are the signs or symptoms? Symptoms of this condition include:  Mild or moderate pain in the hip area. Pain may get worse with movement.  Tenderness and swelling of the hip, especially on the outer side of the hip.  In rare cases, the bursa may become infected. This may cause a fever, as well as warmth and redness in the area.  Symptoms may come and go. How is this diagnosed? This condition may be diagnosed based on:  A physical exam.  Your medical history.  X-rays.  Removal of fluid from your inflamed bursa for testing (biopsy). You may be sent to a health care provider who specializes in bone diseases (orthopedist) or a provider who specializes in joint inflammation (rheumatologist). How is this treated? This condition is treated by resting, icing, applying pressure (compression), and raising (elevating) the injured area. This is called RICE treatment. In some cases, this may be enough to make your symptoms go away. Treatment may also include:  Using crutches.  Draining fluid out of the bursa to help relieve swelling.  Injecting medicine that helps to reduce inflammation (cortisone).  Additional medicines if the bursa is infected. Follow these instructions at home: Managing pain, stiffness, and swelling    If directed, put ice on the painful area. ? Put ice in a plastic bag. ? Place a towel between your skin and the bag. ? Leave the ice on for 20 minutes, 2-3 times a day. ? Raise (elevate) your hip above the level of your heart as much as you can without pain. To do this, try putting a pillow under your hips while you lie down. Activity  Return to your normal activities as told by your health care provider. Ask your health care provider what activities are safe for you.  Rest and protect your hip as much as possible until your pain and swelling get better. General instructions  Take over-the-counter and prescription medicines only as told by your health care provider.  Wear compression wraps only as told by your health care provider.  Do not use your hip to support your body weight until your health care provider says that you can. Use crutches as told by your health care provider.  Gently massage and stretch your injured area as often as is comfortable.  Keep all follow-up visits as told by your health care provider. This is important. How is this prevented?  Exercise regularly, as told by your health care provider.  Warm up and stretch before being active.  Cool down and stretch after being active.  If an activity irritates your hip or causes pain, avoid the activity as much as possible.  Avoid sitting down for long periods at a time. Contact a health care provider if you:  Have a fever.  Develop new symptoms.  Have difficulty walking or doing everyday activities.  Have pain that gets worse or does not get better with medicine.  Develop red skin or a feeling of warmth in your hip area. Get help right away if you:  Cannot move your hip.  Have severe pain. Summary  Hip bursitis is inflammation of a fluid-filled sac (bursa) in the hip joint.  Hip bursitis can cause mild to moderate pain, and symptoms often come and go over time.  This condition is treated with  rest, ice, compression, elevation, and medicines. This information is not intended to replace advice given to you by your health care provider. Make sure you discuss any questions you have with your health care provider. Document Released: 01/27/2002 Document Revised: 04/15/2018 Document Reviewed: 04/15/2018 Elsevier Patient Education  2020 [employer].

## 2019-04-18 NOTE — Progress Notes (Signed)
Subjective  CC:  Chief Complaint  Patient presents with  . Right Hip Pain    Long term, but worse within the last week  . Osteoporosis    HPI: Taylor Buchanan is a 63 y.o. female who presents to [facility] today to establish care with me as a new patient.   She has the following concerns or needs:  Very pleasant married mother of 2 grown sons who works full time as Clinical research associate for [employer] presents as TOC. Sees Eve Isabell Jarvis, np for gyn care. Has had intermittent care by prior pcps. i've reviewed extensive records   Osteoporosis: likely menopausal overdue for f/u dexa; reviewed notes from endocrine back in 2015. Has had biphosphonates in past for about a year but had a hard time taking them. Very intermittent ca/vit D intake and occ weight bearing activities.   Scoliosis: some back pain but manageable.   Chronic foot problems managed by podiatry  C/o GERD / heartburn sxs x several months but now more frequent. Had had a change in diet due to working from home. Perhaps more stress and more coffee as well. No h/o PUD,chronic gerd. No melena or nausea. Appetite is good. Worse post meals and lying down.   C/o right hip pain; chronic and intermittent but moderate to severe over last week. Had cleaned house last week, increased climbing stairs due to home office now upstairs, and some extra walking. Pain is lateral and painful with walking. No back pain, radicular sxs LE weakness.   HM: due for cpe, flu shot, dexa, labs.   Assessment  1. Greater trochanteric bursitis of right hip   2. Other idiopathic scoliosis, thoracolumbar region   3. Age-related osteoporosis without current pathological fracture   4. Gastroesophageal reflux disease, esophagitis presence not specified   5. Need for immunization against influenza      Plan   Hip bursitis due to increased activity and scoliosis gait changes; educated. S/p steroid injection. Routine post procedure care  instructions were given to patient in detail. Ice and stretches discussed.   Scoliosis   Osteoporosis: encouraged strength training, vit d and calcium supplements and repeat dexa to monitor progression. prolia candidate if indicated. Failed biphosphonates in past.   Gerd: treat 4-12 weeks with ppi. Education given. Mild.   HM: flu vaccine today. Dexa. Return for cpe.   Follow up:  Return in about 3 months (around 07/19/2019) for complete physical. Orders Placed This Encounter  Procedures  . DG Bone Density  . Flu Vaccine QUAD 36+ mos IM   Meds ordered this encounter  Medications  . omeprazole (PRILOSEC) 20 MG capsule    Sig: Take 1 capsule (20 mg total) by mouth daily.    Dispense:  30 capsule    Refill:  3     Depression screen [facility] 2/9 04/18/2019  Decreased Interest 0  Down, Depressed, Hopeless 0  PHQ - 2 Score 0    We updated and reviewed the patient's past history in detail and it is documented below.  Patient Active Problem List   Diagnosis Date Noted  . Scoliosis 04/18/2019  . Osteoporosis 09/02/2013   Health Maintenance  Topic Date Due  . DEXA SCAN  11/17/2016  . INFLUENZA VACCINE  03/22/2019  . Hepatitis C Screening  06/26/2027 (Originally [birth year])  . HIV Screening  06/26/2027 (Originally 09/05/1970)  . MAMMOGRAM  02/27/2020  . PAP SMEAR-Modifier  10/19/2021  . COLONOSCOPY  03/22/2023  . TETANUS/TDAP  09/03/2023   Immunization History  Administered Date(s) Administered  . Influenza,inj,Quad PF,6+ Mos 04/18/2019  . Tdap 09/02/2013   Current Meds  Medication Sig  . calcium carbonate (OS-CAL) 600 MG TABS tablet Take 600 mg by mouth 2 (two) times daily with a meal.  . cholecalciferol (VITAMIN D) 1000 UNITS tablet Take 1,000 Units by mouth daily.  . fluticasone (FLONASE) 50 MCG/ACT nasal spray Place 1 spray into both nostrils daily.  . hydrocortisone (PROCTOSOL HC) 2.5 % rectal cream Place 1 application rectally 2 (two) times daily.  . Multiple Vitamin  (MULTIVITAMIN) capsule Take 1 capsule by mouth daily.  . Omega-3 Fatty Acids (FISH OIL PO) Take by mouth.  . [DISCONTINUED] cyclobenzaprine (FLEXERIL) 5 MG tablet Take 1 tablet (5 mg total) by mouth at bedtime as needed for muscle spasms.  . [DISCONTINUED] cyclobenzaprine (FLEXERIL) 5 MG tablet Take 1 tablet (5 mg total) at bedtime as needed by mouth for muscle spasms.    Allergies: Patient has No Known Allergies. Past Medical History Patient  has a past medical history of Osteoporosis, unspecified (09/02/2013) and Scoliosis (04/18/2019). Past Surgical History Patient  has a past surgical history that includes Cholecystectomy and Foot surgery. Family History: Patient family history includes Bladder Cancer in her father; Dementia in her mother; Healthy in her brother, brother, brother, son, and son; Mental illness in her mother; Stroke in her mother. Social History:  Patient  reports that she has quit smoking. She has never used smokeless tobacco. She reports current alcohol use. She reports that she does not use drugs.  Review of Systems: Constitutional: negative for fever or malaise Ophthalmic: negative for photophobia, double vision or loss of vision Cardiovascular: negative for chest pain, dyspnea on exertion, or new LE swelling Respiratory: negative for SOB or persistent cough Gastrointestinal: negative for abdominal pain, change in bowel habits or melena Genitourinary: negative for dysuria or gross hematuria Musculoskeletal: negative for new gait disturbance or muscular weakness, + hip and back pain Integumentary: negative for new or persistent rashes Neurological: negative for TIA or stroke symptoms Psychiatric: negative for SI or delusions Allergic/Immunologic: negative for hives  Patient Care Team    Relationship Specialty Notifications Start End  Leamon Arnt, MD PCP - General Family Medicine  04/18/19   Key, Nelia Shi, NP Nurse Practitioner Gynecology  09/02/13      Objective  Vitals: BP 118/72 (BP Location: Left Arm, Patient Position: Sitting, Cuff Size: Normal)   Pulse 86   Temp 98 F (36.7 C)   Wt 166 lb (75.3 kg)   LMP 08/21/2002   SpO2 96%   BMI 26.39 kg/m  General:  Well developed, well nourished, no acute distress  Psych:  Alert and oriented,normal mood and affect HEENT:  Normocephalic, atraumatic,  Cardiovascular:  RRR without gallop, rub or murmur, nondisplaced PMI Respiratory:  Good breath sounds bilaterally, CTAB with normal respiratory effort Gastrointestinal: normal bowel sounds, soft, non-tender, no noted masses. No HSM MSK: +scoliosis, . Joints are without erythema or swelling Right gr trochanteric bursa ttp, normal ROM of right hip w/o pain.  Skin:  Warm, no rashes or suspicious lesions noted Neurologic:    Mental status is normal.   GR Trochanteric Bursa steroid injection  Procedure Note   Pre-operative Diagnosis: right hip bursitis   Post-operative Diagnosis: same   Indications: pain   Anesthesia: cold spray   Procedure Details    Verbal consent was obtained for the procedure. Universal time out done. The point of maximum tenderness was identified  and marked over the hip bursa. The skin prepped with alcohol and cold spray used for anesthesia. A needle was advanced into the bursa and the steroid/lido was administered easily.    Complications:  None; patient tolerated the procedure well.   Commons side effects, risks, benefits, and alternatives for medications and treatment plan prescribed today were discussed, and the patient expressed understanding of the given instructions. Patient is instructed to call or message via MyChart if he/she has any questions or concerns regarding our treatment plan. No barriers to understanding were identified. We discussed Red Flag symptoms and signs in detail. Patient expressed understanding regarding what to do in case of urgent or emergency type symptoms.   Medication list was  reconciled, printed and provided to the patient in AVS. Patient instructions and summary information was reviewed with the patient as documented in the AVS. This note was prepared with assistance of Dragon voice recognition software. Occasional wrong-word or sound-a-like substitutions may have occurred due to the inherent limitations of voice recognition software

## 2019-04-25 DIAGNOSIS — M7061 Trochanteric bursitis, right hip: Secondary | ICD-10-CM | POA: Diagnosis not present

## 2019-04-25 MED ORDER — TRIAMCINOLONE ACETONIDE 40 MG/ML IJ SUSP
20.0000 mg | Freq: Once | INTRAMUSCULAR | Status: AC
  Administered 2019-04-25: 14:00:00 20 mg via INTRA_ARTICULAR

## 2019-04-25 NOTE — Addendum Note (Signed)
Addended by: Elmer Bales on: 04/25/2019 02:29 PM   Modules accepted: Orders

## 2019-05-08 ENCOUNTER — Other Ambulatory Visit: Payer: Self-pay

## 2019-05-08 ENCOUNTER — Encounter: Payer: Self-pay | Admitting: Podiatry

## 2019-05-08 ENCOUNTER — Ambulatory Visit (INDEPENDENT_AMBULATORY_CARE_PROVIDER_SITE_OTHER): Payer: 59

## 2019-05-08 ENCOUNTER — Ambulatory Visit (INDEPENDENT_AMBULATORY_CARE_PROVIDER_SITE_OTHER): Payer: 59 | Admitting: Podiatry

## 2019-05-08 ENCOUNTER — Other Ambulatory Visit: Payer: Self-pay | Admitting: Podiatry

## 2019-05-08 DIAGNOSIS — M779 Enthesopathy, unspecified: Secondary | ICD-10-CM

## 2019-05-08 DIAGNOSIS — M79672 Pain in left foot: Secondary | ICD-10-CM

## 2019-05-12 NOTE — Progress Notes (Signed)
Subjective:   Patient ID: Taylor Buchanan, female   DOB: 63 y.o.   MRN: [id number]   HPI Patient presents stating she has developed inflammation in the left foot around the second toe and third toe and states is been very tender and going on for a number of months and has not been seen for about 18 months   ROS      Objective:  Physical Exam  Neurovascular status intact with inflammatory capsulitis second MPJ left localized in nature     Assessment:  Inflammatory capsulitis second MPJ left     Plan:  H&P x-ray reviewed today I did sterile prep injected the second MPJ 3 mg Dexasone Kenalog 5 mg Xylocaine advised on rigid bottom shoes and dispensed padding.  Reappoint to recheck  X-rays were negative for signs of fracture or bone injury associated with the condition she is experiencing

## 2019-05-16 ENCOUNTER — Encounter: Payer: 59 | Admitting: Family Medicine

## 2019-07-01 ENCOUNTER — Other Ambulatory Visit: Payer: Self-pay

## 2019-07-01 DIAGNOSIS — Z20822 Contact with and (suspected) exposure to covid-19: Secondary | ICD-10-CM

## 2019-07-02 LAB — NOVEL CORONAVIRUS, NAA: SARS-CoV-2, NAA: NOT DETECTED

## 2019-07-04 ENCOUNTER — Other Ambulatory Visit: Payer: Self-pay

## 2019-07-04 ENCOUNTER — Ambulatory Visit
Admission: RE | Admit: 2019-07-04 | Discharge: 2019-07-04 | Disposition: A | Discharge status: Home / Self Care | Payer: 59 | Source: Ambulatory Visit | Attending: Family Medicine | Admitting: Family Medicine

## 2019-07-04 DIAGNOSIS — M810 Age-related osteoporosis without current pathological fracture: Secondary | ICD-10-CM

## 2019-07-15 ENCOUNTER — Telehealth: Payer: Self-pay | Admitting: *Deleted

## 2019-07-15 NOTE — Telephone Encounter (Signed)
Pt given results per Dr Jonni Sanger, "Please call patient: bone density does confirm osteoporosis. I recommend considering starting Prolia the twice yearly injection for treatment.  She would need to come in for Vit D and Calcium levels prior to starting (unless she has these labs done in the last 6 months - I couldn't find any). If she would like this, rec scheduling lab visit and getting authorization from insurance and then order/schedule.  If she'd like, we can defer till her CPE: please schedule within 3 months. Thanks! "; the pt verbalized understanding, and would like to wait until her appt 08/28/2018; she also says that she would like to have her cholesterol checked at that time; will route to office for notification.

## 2019-08-28 ENCOUNTER — Other Ambulatory Visit: Payer: Self-pay

## 2019-08-29 ENCOUNTER — Ambulatory Visit (INDEPENDENT_AMBULATORY_CARE_PROVIDER_SITE_OTHER): Payer: 59

## 2019-08-29 ENCOUNTER — Encounter: Payer: Self-pay | Admitting: Family Medicine

## 2019-08-29 ENCOUNTER — Ambulatory Visit (INDEPENDENT_AMBULATORY_CARE_PROVIDER_SITE_OTHER): Payer: 59 | Admitting: Family Medicine

## 2019-08-29 VITALS — BP 122/80 | HR 59 | Temp 97.8°F | Ht 66.5 in | Wt 172.8 lb

## 2019-08-29 DIAGNOSIS — Z0000 Encounter for general adult medical examination without abnormal findings: Secondary | ICD-10-CM | POA: Diagnosis not present

## 2019-08-29 DIAGNOSIS — M810 Age-related osteoporosis without current pathological fracture: Secondary | ICD-10-CM | POA: Diagnosis not present

## 2019-08-29 DIAGNOSIS — M7061 Trochanteric bursitis, right hip: Secondary | ICD-10-CM

## 2019-08-29 DIAGNOSIS — M4125 Other idiopathic scoliosis, thoracolumbar region: Secondary | ICD-10-CM | POA: Diagnosis not present

## 2019-08-29 DIAGNOSIS — Z23 Encounter for immunization: Secondary | ICD-10-CM | POA: Diagnosis not present

## 2019-08-29 LAB — CBC WITH DIFFERENTIAL/PLATELET
Basophils Absolute: 0.1 10*3/uL (ref 0.0–0.1)
Basophils Relative: 1.9 % (ref 0.0–3.0)
Eosinophils Absolute: 0.1 10*3/uL (ref 0.0–0.7)
Eosinophils Relative: 1.8 % (ref 0.0–5.0)
HCT: 42.2 % (ref 36.0–46.0)
Hemoglobin: 14.1 g/dL (ref 12.0–15.0)
Lymphocytes Relative: 37.1 % (ref 12.0–46.0)
Lymphs Abs: 2.2 10*3/uL (ref 0.7–4.0)
MCHC: 33.3 g/dL (ref 30.0–36.0)
MCV: 97.8 fl (ref 78.0–100.0)
Monocytes Absolute: 0.4 10*3/uL (ref 0.1–1.0)
Monocytes Relative: 7 % (ref 3.0–12.0)
Neutro Abs: 3.1 10*3/uL (ref 1.4–7.7)
Neutrophils Relative %: 52.2 % (ref 43.0–77.0)
Platelets: 312 10*3/uL (ref 150.0–400.0)
RBC: 4.32 Mil/uL (ref 3.87–5.11)
RDW: 13.3 % (ref 11.5–15.5)
WBC: 5.9 10*3/uL (ref 4.0–10.5)

## 2019-08-29 LAB — LIPID PANEL
Cholesterol: 219 mg/dL — ABNORMAL HIGH (ref 0–200)
HDL: 49.5 mg/dL (ref >39.00)
LDL Cholesterol: 147 mg/dL — ABNORMAL HIGH (ref 0–99)
NonHDL: 169.43
Total CHOL/HDL Ratio: 4
Triglycerides: 112 mg/dL (ref 0.0–149.0)
VLDL: 22.4 mg/dL (ref 0.0–40.0)

## 2019-08-29 LAB — COMPREHENSIVE METABOLIC PANEL
ALT: 21 U/L (ref 0–35)
AST: 27 U/L (ref 0–37)
Albumin: 4.6 g/dL (ref 3.5–5.2)
Alkaline Phosphatase: 66 U/L (ref 39–117)
BUN: 18 mg/dL (ref 6–23)
CO2: 26 mEq/L (ref 19–32)
Calcium: 10 mg/dL (ref 8.4–10.5)
Chloride: 104 mEq/L (ref 96–112)
Creatinine, Ser: 0.85 mg/dL (ref 0.40–1.20)
GFR: 67.55 mL/min (ref >60.00)
Glucose, Bld: 91 mg/dL (ref 70–99)
Potassium: 4.2 mEq/L (ref 3.5–5.1)
Sodium: 139 mEq/L (ref 135–145)
Total Bilirubin: 0.6 mg/dL (ref 0.2–1.2)
Total Protein: 6.9 g/dL (ref 6.0–8.3)

## 2019-08-29 LAB — VITAMIN D 25 HYDROXY (VIT D DEFICIENCY, FRACTURES): VITD: 47.31 ng/mL (ref 30.00–100.00)

## 2019-08-29 LAB — TSH: TSH: 2.02 u[IU]/mL (ref 0.35–4.50)

## 2019-08-29 NOTE — Patient Instructions (Addendum)
Please return in 12 months for your annual complete physical; please come fasting. Please schedule a lab visit in 6 months for your 2nd Shingrix vaccination  We will work on getting you set up for Prolia injections for osteoporosis.  I have ordered PT for your hip pain.  I will release your lab results to you on your MyChart account with further instructions. Please reply with any questions.   Today you were given your 1st of 2 Shingrix vaccination. This protects you from Shingles.  If you have any questions or concerns, please don't hesitate to send me a message via MyChart or call the office at [phone]. Thank you for visiting with [place] today! It's our pleasure caring for you.   Osteoporosis  Osteoporosis is thinning and loss of density in your bones. Osteoporosis makes bones more brittle and fragile and more likely to break (fracture). Over time, osteoporosis can cause your bones to become so weak that they fracture after a minor fall. Bones in the hip, wrist, and spine are most likely to fracture due to osteoporosis. What are the causes? The exact cause of this condition is not known. What increases the risk? You may be at greater risk for osteoporosis if you:  Have a family history of the condition.  Have poor nutrition.  Use steroid medicines, such as prednisone.  Are female.  Are age 38 or older.  Smoke or have a history of smoking.  Are not physically active (are sedentary).  Are white (Caucasian) or of Asian descent.  Have a small body frame.  Take certain medicines, such as antiseizure medicines. What are the signs or symptoms? A fracture might be the first sign of osteoporosis, especially if the fracture results from a fall or injury that usually would not cause a bone to break. Other signs and symptoms include:  Pain in the neck or low back.  Stooped posture.  Loss of height. How is this diagnosed? This condition may be diagnosed based on:  Your medical  history.  A physical exam.  A bone mineral density test, also called a DXA or DEXA test (dual-energy X-ray absorptiometry test). This test uses X-rays to measure the amount of minerals in your bones. How is this treated? The goal of treatment is to strengthen your bones and lower your risk for a fracture. Treatment may involve:  Making lifestyle changes, such as: ? Including foods with more calcium and vitamin D in your diet. ? Doing weight-bearing and muscle-strengthening exercises. ? Stopping tobacco use. ? Limiting alcohol intake.  Taking medicine to slow the process of bone loss or to increase bone density.  Taking daily supplements of calcium and vitamin D.  Taking hormone replacement medicines, such as estrogen for women and testosterone for men.  Monitoring your levels of calcium and vitamin D. Follow these instructions at home:  Activity  Exercise as told by your health care provider. Ask your health care provider what exercises and activities are safe for you. You should do: ? Exercises that make you work against gravity (weight-bearing exercises), such as tai chi, yoga, or walking. ? Exercises to strengthen muscles, such as lifting weights. Lifestyle  Limit alcohol intake to no more than 1 drink a day for nonpregnant women and 2 drinks a day for men. One drink equals 12 oz of beer, 5 oz of wine, or 1 oz of hard liquor.  Do not use any products that contain nicotine or tobacco, such as cigarettes and e-cigarettes. If you need help  quitting, ask your health care provider. Preventing falls  Use devices to help you move around (mobility aids) as needed, such as canes, walkers, scooters, or crutches.  Keep rooms well-lit and clutter-free.  Remove tripping hazards from walkways, including cords and throw rugs.  Install grab bars in bathrooms and safety rails on stairs.  Use rubber mats in the bathroom and other areas that are often wet or slippery.  Wear closed-toe  shoes that fit well and support your feet. Wear shoes that have rubber soles or low heels.  Review your medicines with your health care provider. Some medicines can cause dizziness or changes in blood pressure, which can increase your risk of falling. General instructions  Include calcium and vitamin D in your diet. Calcium is important for bone health, and vitamin D helps your body to absorb calcium. Good sources of calcium and vitamin D include: ? Certain fatty fish, such as salmon and tuna. ? Products that have calcium and vitamin D added to them (fortified products), such as fortified cereals. ? Egg yolks. ? Cheese. ? Liver.  Take over-the-counter and prescription medicines only as told by your health care provider.  Keep all follow-up visits as told by your health care provider. This is important. Contact a health care provider if:  You have never been screened for osteoporosis and you are: ? A woman who is age 77 or older. ? A man who is age 68 or older. Get help right away if:  You fall or injure yourself. Summary  Osteoporosis is thinning and loss of density in your bones. This makes bones more brittle and fragile and more likely to break (fracture),even with minor falls.  The goal of treatment is to strengthen your bones and reduce your risk for a fracture.  Include calcium and vitamin D in your diet. Calcium is important for bone health, and vitamin D helps your body to absorb calcium.  Talk with your health care provider about screening for osteoporosis if you are a woman who is age 15 or older, or a man who is age 72 or older. This information is not intended to replace advice given to you by your health care provider. Make sure you discuss any questions you have with your health care provider. Document Revised: 07/20/2017 Document Reviewed: 06/01/2017 Elsevier Patient Education  [place].  Denosumab injection What is this medicine? DENOSUMAB (den oh sue  mab) slows bone breakdown. Prolia is used to treat osteoporosis in women after menopause and in men, and in people who are taking corticosteroids for 6 months or more. Delton See is used to treat a high calcium level due to cancer and to prevent bone fractures and other bone problems caused by multiple myeloma or cancer bone metastases. Delton See is also used to treat giant cell tumor of the bone. This medicine may be used for other purposes; ask your health care provider or pharmacist if you have questions. COMMON BRAND NAME(S): Prolia, XGEVA What should I tell my health care provider before I take this medicine? They need to know if you have any of these conditions:  dental disease  having surgery or tooth extraction  infection  kidney disease  low levels of calcium or Vitamin D in the blood  malnutrition  on hemodialysis  skin conditions or sensitivity  thyroid or parathyroid disease  an unusual reaction to denosumab, other medicines, foods, dyes, or preservatives  pregnant or trying to get pregnant  breast-feeding How should I use this medicine? This  medicine is for injection under the skin. It is given by a health care professional in a hospital or clinic setting. A special MedGuide will be given to you before each treatment. Be sure to read this information carefully each time. For Prolia, talk to your pediatrician regarding the use of this medicine in children. Special care may be needed. For Delton See, talk to your pediatrician regarding the use of this medicine in children. While this drug may be prescribed for children as young as 13 years for selected conditions, precautions do apply. Overdosage: If you think you have taken too much of this medicine contact a poison control center or emergency room at once. NOTE: This medicine is only for you. Do not share this medicine with others. What if I miss a dose? It is important not to miss your dose. Call your doctor or health care  professional if you are unable to keep an appointment. What may interact with this medicine? Do not take this medicine with any of the following medications:  other medicines containing denosumab This medicine may also interact with the following medications:  medicines that lower your chance of fighting infection  steroid medicines like prednisone or cortisone This list may not describe all possible interactions. Give your health care provider a list of all the medicines, herbs, non-prescription drugs, or dietary supplements you use. Also tell them if you smoke, drink alcohol, or use illegal drugs. Some items may interact with your medicine. What should I watch for while using this medicine? Visit your doctor or health care professional for regular checks on your progress. Your doctor or health care professional may order blood tests and other tests to see how you are doing. Call your doctor or health care professional for advice if you get a fever, chills or sore throat, or other symptoms of a cold or flu. Do not treat yourself. This drug may decrease your body's ability to fight infection. Try to avoid being around people who are sick. You should make sure you get enough calcium and vitamin D while you are taking this medicine, unless your doctor tells you not to. Discuss the foods you eat and the vitamins you take with your health care professional. See your dentist regularly. Brush and floss your teeth as directed. Before you have any dental work done, tell your dentist you are receiving this medicine. Do not become pregnant while taking this medicine or for 5 months after stopping it. Talk with your doctor or health care professional about your birth control options while taking this medicine. Women should inform their doctor if they wish to become pregnant or think they might be pregnant. There is a potential for serious side effects to an unborn child. Talk to your health care professional or  pharmacist for more information. What side effects may I notice from receiving this medicine? Side effects that you should report to your doctor or health care professional as soon as possible:  allergic reactions like skin rash, itching or hives, swelling of the face, lips, or tongue  bone pain  breathing problems  dizziness  jaw pain, especially after dental work  redness, blistering, peeling of the skin  signs and symptoms of infection like fever or chills; cough; sore throat; pain or trouble passing urine  signs of low calcium like fast heartbeat, muscle cramps or muscle pain; pain, tingling, numbness in the hands or feet; seizures  unusual bleeding or bruising  unusually weak or tired Side effects that usually do  not require medical attention (report to your doctor or health care professional if they continue or are bothersome):  constipation  diarrhea  headache  joint pain  loss of appetite  muscle pain  runny nose  tiredness  upset stomach This list may not describe all possible side effects. Call your doctor for medical advice about side effects. You may report side effects to FDA at 1-800-FDA-1088. Where should I keep my medicine? This medicine is only given in a clinic, doctor's office, or other health care setting and will not be stored at home. NOTE: This sheet is a summary. It may not cover all possible information. If you have questions about this medicine, talk to your doctor, pharmacist, or health care provider.  2020 Elsevier/Gold Standard (2017-12-14 16:10:44)

## 2019-08-29 NOTE — Progress Notes (Signed)
Subjective  Chief Complaint  Patient presents with  . Annual Exam    HPI: Taylor Buchanan is a 64 y.o. female who presents to [facility] at [place] today for a Female Wellness Visit. She also has the concerns and/or needs as listed above in the chief complaint. These will be addressed in addition to the Health Maintenance Visit.   Wellness Visit: annual visit with health maintenance review and exam without Pap, she sees gynecology.   HM: Screening is all up-to-date.  Mammogram normal Pap smear up-to-date.  Colonoscopy normal and up-to-date.  She is due the Shingrix vaccinations. Chronic disease f/u and/or acute problem visit: (deemed necessary to be done in addition to the wellness visit):  Osteoporosis: Reviewed recent bone density results showing a T score of -3.3.  Reviewed her initial diagnosis which she reports was about 10 to 12 years ago.  She has early osteoporosis without identified risk factors other than possibly genetic.  She is active, takes calcium and vitamin D.  She has never had a fracture.  She has used biphosphonate therapy, several of them and they caused stomach irritation/upset.  She understands risks of osteoporosis and is likelihood of progression.  She would like to start therapies.  Hip bursitis: Right hip status post urine injection at last visit which helped but not completely.  Still with right hip pain but also with pain on walking.  She denies low back pain.  She does have scoliosis which at times bothers her but this is more in the thoracic region.  She has no radicular symptoms.  No leg weakness.  She does take regular NSAIDs to treat the pain.  She also has started stretches without significant relief.   Assessment  1. Annual physical exam   2. Osteoporosis without current pathological fracture, unspecified osteoporosis type   3. Greater trochanteric bursitis of right hip   4. Need for shingles vaccine   5. Other idiopathic scoliosis,  thoracolumbar region      Plan  Female Wellness Visit:  Age appropriate Health Maintenance and Prevention measures were discussed with patient. Included topics are cancer screening recommendations, ways to keep healthy (see AVS) including dietary and exercise recommendations, regular eye and dental care, use of seat belts, and avoidance of moderate alcohol use and tobacco use.   BMI: discussed patient's BMI and encouraged positive lifestyle modifications to help get to or maintain a target BMI.  HM needs and immunizations were addressed and ordered. See below for orders. See HM and immunization section for updates.  For Shingrix today, repeat in 6 months  Routine labs and screening tests ordered including cmp, cbc and lipids where appropriate.  Discussed recommendations regarding Vit D and calcium supplementation (see AVS)  Chronic disease management visit and/or acute problem visit:  Osteoporosis: Counseling done regarding indications for treatment and treatment options.  Given intolerance to biphosphonate's, we elect Prolia.  Discussed that this is best for at least 2 years and then we will reassess her bone density at that time.  Discouraged intermittent use.  Will get set up for injections here in the office.  Check calcium and vitamin D levels today.  Continue supplements and weightbearing activity.  Hip bursitis/hip pain: Check x-rays.  Refer for physical therapy.  To sports medicine if we can get improved.  Follow up: Return in about 1 year (around 08/28/2020) for complete physical and 40m for 2nd Shingrix.  Orders Placed This Encounter  Procedures  . DG HIPS BILAT W  OR W/O PELVIS 2V  . Varicella-zoster vaccine IM (Shingrix)  . CBC w/Diff  . CMP  . Lipids  . TSH  . Vit D 25OH  . Hep Cab  . PT-HPC   No orders of the defined types were placed in this encounter.     Lifestyle: Body mass index is 27.47 kg/m. Wt Readings from Last 3 Encounters:  08/29/19 172 lb 12.8 oz (78.4  kg)  04/18/19 166 lb (75.3 kg)  10/29/17 159 lb (72.1 kg)   D  Patient Active Problem List   Diagnosis Date Noted  . Scoliosis 04/18/2019  . Osteoporosis 09/02/2013    Dexa 06/2019: T - -3.3 radius, -2.3 hip. Failed biphosphonates in past. rec prolia.     Health Maintenance  Topic Date Due  . Hepatitis C Screening  06/26/2027 (Originally [birth year])  . MAMMOGRAM  02/27/2020  . PAP SMEAR-Modifier  10/19/2021  . DEXA SCAN  07/03/2022  . COLONOSCOPY  03/22/2023  . TETANUS/TDAP  09/03/2023  . INFLUENZA VACCINE  Completed  . HIV Screening  Discontinued   Immunization History  Administered Date(s) Administered  . Influenza,inj,Quad PF,6+ Mos 04/18/2019  . Tdap 09/02/2013  . Zoster Recombinat (Shingrix) 08/29/2019   We updated and reviewed the patient's past history in detail and it is documented below. Allergies: Patient has No Known Allergies. Past Medical History Patient  has a past medical history of Osteoporosis, unspecified (09/02/2013) and Scoliosis (04/18/2019). Past Surgical History Patient  has a past surgical history that includes Cholecystectomy and Foot surgery. Family History: Patient family history includes Bladder Cancer in her father; Dementia in her mother; Healthy in her brother, brother, brother, son, and son; Mental illness in her mother; Stroke in her mother. Social History:  Patient  reports that she has quit smoking. She has never used smokeless tobacco. She reports current alcohol use. She reports that she does not use drugs.  Review of Systems: Constitutional: negative for fever or malaise Ophthalmic: negative for photophobia, double vision or loss of vision Cardiovascular: negative for chest pain, dyspnea on exertion, or new LE swelling Respiratory: negative for SOB or persistent cough Gastrointestinal: negative for abdominal pain, change in bowel habits or melena Genitourinary: negative for dysuria or gross hematuria, no abnormal uterine bleeding or  disharge Musculoskeletal: negative for new gait disturbance or muscular weakness Integumentary: negative for new or persistent rashes, no breast lumps Neurological: negative for TIA or stroke symptoms Psychiatric: negative for SI or delusions Allergic/Immunologic: negative for hives  Patient Care Team    Relationship Specialty Notifications Start End  Leamon Arnt, MD PCP - General Family Medicine  04/18/19   Key, Nelia Shi, NP Nurse Practitioner Gynecology  09/02/13     Objective  Vitals: BP 122/80 (BP Location: Right Arm, Patient Position: Sitting, Cuff Size: Normal)   Pulse (!) 59   Temp 97.8 F (36.6 C) (Temporal)   Ht 5' 6.5" (1.689 m)   Wt 172 lb 12.8 oz (78.4 kg)   LMP 08/21/2002   SpO2 99%   BMI 27.47 kg/m  General:  Well developed, well nourished, no acute distress  Psych:  Alert and orientedx3,normal mood and affect HEENT:  Normocephalic, atraumatic, non-icteric sclera, PERRL, oropharynx is clear without mass or exudate, supple neck without adenopathy, mass or thyromegaly Cardiovascular:  Normal S1, S2, RRR without gallop, rub or murmur, nondisplaced PMI Respiratory:  Good breath sounds bilaterally, CTAB with normal respiratory effort Gastrointestinal: normal bowel sounds, soft, non-tender, no noted masses. No HSM MSK: no deformities, contusions.  Joints are without erythema or swelling. Spine and CVA region are nontender Skin:  Warm, no rashes or suspicious lesions noted Neurologic:    Mental status is normal. CN 2-11 are normal. Gross motor and sensory exams are normal. Normal gait. No tremor    Commons side effects, risks, benefits, and alternatives for medications and treatment plan prescribed today were discussed, and the patient expressed understanding of the given instructions. Patient is instructed to call or message via MyChart if he/she has any questions or concerns regarding our treatment plan. No barriers to understanding were identified. We discussed Red Flag  symptoms and signs in detail. Patient expressed understanding regarding what to do in case of urgent or emergency type symptoms.   Medication list was reconciled, printed and provided to the patient in AVS. Patient instructions and summary information was reviewed with the patient as documented in the AVS. This note was prepared with assistance of Dragon voice recognition software. Occasional wrong-word or sound-a-like substitutions may have occurred due to the inherent limitations of voice recognition software  This visit occurred during the SARS-CoV-2 public health emergency.  Safety protocols were in place, including screening questions prior to the visit, additional usage of staff PPE, and extensive cleaning of exam room while observing appropriate contact time as indicated for disinfecting solutions.

## 2019-09-01 LAB — HEPATITIS C ANTIBODY
Hepatitis C Ab: NONREACTIVE
SIGNAL TO CUT-OFF: 0.01 (ref <1.00)

## 2019-09-04 ENCOUNTER — Telehealth: Payer: Self-pay

## 2019-09-04 NOTE — Telephone Encounter (Signed)
LVM for patient to return call. 

## 2019-09-04 NOTE — Telephone Encounter (Signed)
Please let her know that I believe this is the best option for her as it is the most efficacious and she has early significant osteoporosis; cost is per 14months of treatment and so is comparable to many drug therapies that can cost $20-40/ month.  If cost is an absolute barrier, then we can compare to some less recommended oral medications.   If she agrees, let's get her set up.

## 2019-09-04 NOTE — Telephone Encounter (Signed)
Please advise 

## 2019-09-04 NOTE — Telephone Encounter (Signed)
Pt is requesting a call back from jasmine.

## 2019-09-05 NOTE — Telephone Encounter (Signed)
Patient agrees to come in to start on Prolia. Nurse visit scheduled for patient to come in next week.

## 2019-09-08 ENCOUNTER — Other Ambulatory Visit: Payer: Self-pay

## 2019-09-09 ENCOUNTER — Ambulatory Visit (INDEPENDENT_AMBULATORY_CARE_PROVIDER_SITE_OTHER): Payer: 59

## 2019-09-09 DIAGNOSIS — M810 Age-related osteoporosis without current pathological fracture: Secondary | ICD-10-CM | POA: Diagnosis not present

## 2019-09-09 MED ORDER — DENOSUMAB 60 MG/ML ~~LOC~~ SOSY
60.0000 mg | PREFILLED_SYRINGE | Freq: Once | SUBCUTANEOUS | Status: AC
  Administered 2019-09-09: 60 mg via SUBCUTANEOUS

## 2019-09-09 NOTE — Progress Notes (Signed)
Per orders of Dr. Jonni Sanger , injection of Prolia given by [facility] in right deltoid. Patient tolerated injection well. Patient will make appointment for 6 month.

## 2019-09-15 ENCOUNTER — Encounter: Payer: Self-pay | Admitting: Physical Therapy

## 2019-09-15 ENCOUNTER — Other Ambulatory Visit: Payer: Self-pay

## 2019-09-15 ENCOUNTER — Ambulatory Visit (INDEPENDENT_AMBULATORY_CARE_PROVIDER_SITE_OTHER): Payer: 59 | Admitting: Physical Therapy

## 2019-09-15 DIAGNOSIS — R2689 Other abnormalities of gait and mobility: Secondary | ICD-10-CM

## 2019-09-15 DIAGNOSIS — M25551 Pain in right hip: Secondary | ICD-10-CM | POA: Diagnosis not present

## 2019-09-15 DIAGNOSIS — M25572 Pain in left ankle and joints of left foot: Secondary | ICD-10-CM | POA: Diagnosis not present

## 2019-09-15 NOTE — Therapy (Signed)
[facility] [address] [place], [place], [address] Phone: [phone]   Fax:  [phone]  Physical Therapy Evaluation  Patient Details  Name: Taylor Buchanan MRN: [id number] Date of Birth: 1956-01-06 Referring Provider (PT): Billey Chang   Encounter Date: 09/15/2019  PT End of Session - 09/15/19 1025    Visit Number  1    Number of Visits  12    Date for PT Re-Evaluation  10/27/19    Authorization Type  [employer]    PT Start Time  1016    PT Stop Time  1057    PT Time Calculation (min)  41 min    Activity Tolerance  Patient tolerated treatment well    Behavior During Therapy  [facility] for tasks assessed/performed       Past Medical History:  Diagnosis Date  . Osteoporosis, unspecified 09/02/2013  . Scoliosis 04/18/2019    Past Surgical History:  Procedure Laterality Date  . CHOLECYSTECTOMY    . FOOT SURGERY      There were no vitals filed for this visit.   Subjective Assessment - 09/15/19 1457    Subjective  Pt states ongoing pain in R hip , for almost 2 years. She has scoliosis and osteoporosis, but has not had previous hip pain. L foot has been also bothersome, since PTT tendon surgery. Still has pain in mets, and in toe extensors. Most pain is in lateral hip, greater troch, pt wants to be more active, is retired, wants to travel, etc.    Pertinent History  Previous PTT surg on L foot.    Limitations  Standing;Walking;House hold activities    Diagnostic tests  Neg Hip x-ray    Currently in Pain?  Yes    Pain Score  5     Pain Location  Hip    Pain Orientation  Right    Pain Descriptors / Indicators  Aching    Pain Type  Chronic pain    Pain Onset  More than a month ago    Pain Frequency  Intermittent    Aggravating Factors   standing, walking, increased activity    Multiple Pain Sites  Yes    Pain Score  4    Pain Location  Foot    Pain Orientation  Left    Pain Descriptors / Indicators  Aching    Pain Type  Chronic pain     Pain Onset  More than a month ago    Pain Frequency  Intermittent    Aggravating Factors   increased walking, activity         [facility] PT Assessment - 09/15/19 0001      Assessment   Medical Diagnosis  R hip pain, L foot pain    Referring Provider (PT)  Billey Chang    Prior Therapy  no      Balance Screen   Has the patient fallen in the past 6 months  No      Prior Function   Level of Independence  Independent      Cognition   Overall Cognitive Status  Within Functional Limits for tasks assessed      Posture/Postural Control   Posture Comments  Significant scoliosis of thoracic spine, S curve.; Significant pronation on L foot.       ROM / Strength   AROM / PROM / Strength  AROM;Strength      AROM   Overall AROM Comments  Hips: WFL, Knee: WFL,  Ankle: WFL.       Strength   Overall Strength Comments  Hips: 4-/5, Knee: 4/5, L ankle: 4/5       Palpation   Palpation comment  Pain at R greater trochanter, pain and tenderness, trigger points into R glute, Mild pain into ITB.   Sorenss at dorsum of foot into toe extensors,       Ambulation/Gait   Gait Comments  Decreased stance time on L, increased toe extension on L, decreased push off on L.                 Objective measurements completed on examination: See above findings.      [facility] Adult PT Treatment/Exercise - 09/15/19 0001      Exercises   Exercises  Knee/Hip      Knee/Hip Exercises: Stretches   Piriformis Stretch  2 reps;30 seconds    Piriformis Stretch Limitations  seated and supine fig 4       Knee/Hip Exercises: Supine   Bridges  20 reps      Knee/Hip Exercises: Sidelying   Hip ABduction  10 reps;Both    Clams  x10 bil;              PT Education - 09/15/19 1502    Education Details  PT POC, Exam findings.    Person(s) Educated  Patient    Methods  Explanation;Demonstration;Verbal cues;Handout    Comprehension  Verbalized understanding;Returned demonstration;Verbal cues  required;Tactile cues required;Need further instruction       PT Short Term Goals - 09/15/19 1504      PT SHORT TERM GOAL #1   Title  Pt to be independent with initial HEP    Time  2    Period  Weeks    Status  New    Target Date  09/29/19      PT SHORT TERM GOAL #2   Title  Pt to report decreased pain in R hip, to 3/10        PT Long Term Goals - 09/15/19 1524      PT LONG TERM GOAL #1   Title  Pt to be independent with final HEP    Time  6    Period  Weeks    Target Date  10/27/19      PT LONG TERM GOAL #2   Title  Pt to report decreased pain in R hip, to 0-2/10 with activity    Time  6    Period  Weeks    Status  New    Target Date  10/27/19      PT LONG TERM GOAL #3   Title  Pt to report decreased pain in L foot, to 0-3/10 with  walking activity    Time  6    Period  Weeks    Status  New    Target Date  10/27/19      PT LONG TERM GOAL #4   Title  Pt to demo improved strength of bil hips to at least 4+/5, to improve stability and pain.    Time  6    Period  Weeks    Status  New    Target Date  10/27/19      PT LONG TERM GOAL #5   Title  Pt to report ability to walk up to 1 mile, without increased pain in R hip.    Time  6    Period  Weeks    Status  New    Target Date  10/27/19             Plan - 09/15/19 1526    Clinical Impression Statement  Pt presents wtih primary complaint of increased pain in R hip. Pt with soreness in greater trochanter and latera/posterior hip. Pt with decreased strength and stability of R hip, with lack of effective HEP for Dx. Pt also with ongoing pain in L foot, and will benefit from treatment of this as well. She will benefit from education/recommendation for footwear as well. Pt with decreased gait mechanics, due to posture and pain. She also has significant scoliosis (non painful) and Osteoporosis. Pt with decreased ability for full functional activities, due to pain, and will benefit from skilled PT to improve.     Personal Factors and Comorbidities  Comorbidity 1    Comorbidities  L foot pain, Osteoporosis, Scoliosis, OA,    Examination-Activity Limitations  Locomotion Level;Stairs;Stand    Examination-Participation Restrictions  Cleaning;Community Activity    Stability/Clinical Decision Making  Evolving/Moderate complexity    Clinical Decision Making  Moderate    Rehab Potential  Good    PT Frequency  2x / week    PT Duration  6 weeks    PT Treatment/Interventions  ADLs/Self Care Home Management;Cryotherapy;Electrical Stimulation;DME Instruction;Ultrasound;Moist Heat;Iontophoresis 4mg /ml Dexamethasone;Gait training;Stair training;Functional mobility training;Therapeutic activities;Therapeutic exercise;Balance training;Neuromuscular re-education;Patient/family education;Passive range of motion;Dry needling;Joint Manipulations;Spinal Manipulations;Taping;Manual techniques;Traction    Consulted and Agree with Plan of Care  Patient       Patient will benefit from skilled therapeutic intervention in order to improve the following deficits and impairments:  Abnormal gait, Decreased range of motion, Difficulty walking, Impaired UE functional use, Increased muscle spasms, Decreased activity tolerance, Pain, Improper body mechanics, Postural dysfunction, Decreased strength, Decreased mobility, Impaired flexibility, Decreased balance  Visit Diagnosis: Pain in right hip  Pain in left ankle and joints of left foot  Other abnormalities of gait and mobility     Problem List Patient Active Problem List   Diagnosis Date Noted  . Scoliosis 04/18/2019  . Osteoporosis 09/02/2013    Lyndee Hensen, PT, DPT 3:45 PM  09/15/19    [facility] [place], [place], [address] Phone: [phone]   Fax:  [phone]  Name: YIREH MIRSKY MRN: [id number] Date of Birth: June 16, 1956

## 2019-09-15 NOTE — Patient Instructions (Addendum)
Access Code: Access Code: [email or web address]  URL: [email or web address]  Date: 09/15/2019  Prepared by: Lyndee Hensen   Exercises Sidelying Hip Abduction - 10 reps - 1-2 sets - 2x daily Clamshell - 10 reps - 1 sets - 2x daily Supine Bridge - 10 reps - 2 sets - 2x daily Seated Piriformis Stretch with Trunk Bend - 3 reps - 30 hold - 2x daily Supine Piriformis Stretch - 3 reps - 30 hold - 2x daily URL: [email or web address]  Date: 09/15/2019  Prepared by: Lyndee Hensen

## 2019-09-18 ENCOUNTER — Other Ambulatory Visit: Payer: Self-pay

## 2019-09-18 ENCOUNTER — Ambulatory Visit (INDEPENDENT_AMBULATORY_CARE_PROVIDER_SITE_OTHER): Payer: 59 | Admitting: Physical Therapy

## 2019-09-18 ENCOUNTER — Encounter: Payer: Self-pay | Admitting: Physical Therapy

## 2019-09-18 DIAGNOSIS — R2689 Other abnormalities of gait and mobility: Secondary | ICD-10-CM | POA: Diagnosis not present

## 2019-09-18 DIAGNOSIS — M25551 Pain in right hip: Secondary | ICD-10-CM | POA: Diagnosis not present

## 2019-09-18 DIAGNOSIS — M25572 Pain in left ankle and joints of left foot: Secondary | ICD-10-CM

## 2019-09-18 NOTE — Patient Instructions (Signed)
Access Code: [email or web address]  URL: [email or web address]  Date: 09/18/2019  Prepared by: Lyndee Hensen   Exercises Seated Piriformis Stretch with Trunk Bend - 3 reps - 30 hold - 2x daily Supine Piriformis Stretch - 3 reps - 30 hold - 2x daily Standing ITB Stretch - 3 reps - 30 hold - 2x daily Sidelying Hip Abduction - 10 reps - 1-2 sets - 1x daily Clamshell - 10 reps - 1 sets - 1x daily Supine Bridge - 10 reps - 1 sets - 1x daily Hooklying Clamshell with Resistance - 10 reps - 2 sets - 1x daily

## 2019-09-18 NOTE — Therapy (Signed)
[facility] [address] [place], [place], [address] Phone: [phone]   Fax:  [phone]  Physical Therapy Treatment  Patient Details  Name: Taylor Buchanan MRN: [id number] Date of Birth: April 25, 1956 Referring Provider (PT): Billey Chang   Encounter Date: 09/18/2019  PT End of Session - 09/18/19 1206    Visit Number  2    Number of Visits  12    Date for PT Re-Evaluation  10/27/19    Authorization Type  [employer]HC    PT Start Time  [id number]    PT Stop Time  1056    PT Time Calculation (min)  41 min    Activity Tolerance  Patient tolerated treatment well    Behavior During Therapy  [facility] for tasks assessed/performed       Past Medical History:  Diagnosis Date  . Osteoporosis, unspecified 09/02/2013  . Scoliosis 04/18/2019    Past Surgical History:  Procedure Laterality Date  . CHOLECYSTECTOMY    . FOOT SURGERY      There were no vitals filed for this visit.  Subjective Assessment - 09/18/19 1018    Subjective  Pt states soreness in hip today.    Currently in Pain?  Yes    Pain Score  7     Pain Location  Hip    Pain Orientation  Right    Pain Descriptors / Indicators  Aching    Pain Type  Chronic pain    Pain Onset  More than a month ago    Pain Frequency  Intermittent                       [facility] Adult PT Treatment/Exercise - 09/18/19 1022      Ambulation/Gait   Gait Comments  Decreased stance time on L, increased toe extension on L, decreased push off on L.       Posture/Postural Control   Posture Comments  Significant scoliosis of thoracic spine, S curve.; Significant pronation on L foot.       Exercises   Exercises  Knee/Hip      Knee/Hip Exercises: Stretches   ITB Stretch  2 reps;30 seconds    ITB Stretch Limitations  standing at wall    Piriformis Stretch  2 reps;30 seconds    Piriformis Stretch Limitations  seated       Knee/Hip Exercises: Aerobic   Stationary Bike  L1 x 6 min      Knee/Hip  Exercises: Supine   Bridges  20 reps      Knee/Hip Exercises: Sidelying   Hip ABduction  10 reps;Both    Clams  x10 bil;       Modalities   Modalities  Iontophoresis      Iontophoresis   Type of Iontophoresis  Dexamethasone    Location  R lateral hip    Time  6 hr patch      Manual Therapy   Manual Therapy  Soft tissue mobilization    Soft tissue mobilization  DTM and IASTm to lateral hip and glute               PT Short Term Goals - 09/15/19 1504      PT SHORT TERM GOAL #1   Title  Pt to be independent with initial HEP    Time  2    Period  Weeks    Status  New    Target Date  09/29/19  PT SHORT TERM GOAL #2   Title  Pt to report decreased pain in R hip, to 3/10        PT Long Term Goals - 09/15/19 1524      PT LONG TERM GOAL #1   Title  Pt to be independent with final HEP    Time  6    Period  Weeks    Target Date  10/27/19      PT LONG TERM GOAL #2   Title  Pt to report decreased pain in R hip, to 0-2/10 with activity    Time  6    Period  Weeks    Status  New    Target Date  10/27/19      PT LONG TERM GOAL #3   Title  Pt to report decreased pain in L foot, to 0-3/10 with  walking activity    Time  6    Period  Weeks    Status  New    Target Date  10/27/19      PT LONG TERM GOAL #4   Title  Pt to demo improved strength of bil hips to at least 4+/5, to improve stability and pain.    Time  6    Period  Weeks    Status  New    Target Date  10/27/19      PT LONG TERM GOAL #5   Title  Pt to report ability to walk up to 1 mile, without increased pain in R hip.    Time  6    Period  Weeks    Status  New    Target Date  10/27/19            Plan - 09/18/19 1145    Clinical Impression Statement  Pt with mush soreness with DTM and manual today in lateral hip. Challenged with strengthening , weakness noted in R vs L side with ther ex. Added Ionto for pain at lateral hip. Plan to progress as tolerated .    Personal Factors and  Comorbidities  Comorbidity 1    Comorbidities  L foot pain, Osteoporosis, Scoliosis, OA,    Examination-Activity Limitations  Locomotion Level;Stairs;Stand    Examination-Participation Restrictions  Cleaning;Community Activity    Stability/Clinical Decision Making  Evolving/Moderate complexity    Rehab Potential  Good    PT Frequency  2x / week    PT Duration  6 weeks    PT Treatment/Interventions  ADLs/Self Care Home Management;Cryotherapy;Electrical Stimulation;DME Instruction;Ultrasound;Moist Heat;Iontophoresis 4mg /ml Dexamethasone;Gait training;Stair training;Functional mobility training;Therapeutic activities;Therapeutic exercise;Balance training;Neuromuscular re-education;Patient/family education;Passive range of motion;Dry needling;Joint Manipulations;Spinal Manipulations;Taping;Manual techniques;Traction    Consulted and Agree with Plan of Care  Patient       Patient will benefit from skilled therapeutic intervention in order to improve the following deficits and impairments:  Abnormal gait, Decreased range of motion, Difficulty walking, Impaired UE functional use, Increased muscle spasms, Decreased activity tolerance, Pain, Improper body mechanics, Postural dysfunction, Decreased strength, Decreased mobility, Impaired flexibility, Decreased balance  Visit Diagnosis: Pain in right hip  Pain in left ankle and joints of left foot  Other abnormalities of gait and mobility     Problem List Patient Active Problem List   Diagnosis Date Noted  . Scoliosis 04/18/2019  . Osteoporosis 09/02/2013   Taylor Buchanan, PT, DPT 12:06 PM  09/18/19    [facility] [place], [place], [address] Phone: [phone]   Fax:  [phone]  Name:  Taylor Buchanan MRN: [id number] Date of Birth: September 24, 1955

## 2019-09-22 ENCOUNTER — Encounter: Payer: Self-pay | Admitting: Physical Therapy

## 2019-09-22 ENCOUNTER — Ambulatory Visit (INDEPENDENT_AMBULATORY_CARE_PROVIDER_SITE_OTHER): Payer: 59 | Admitting: Physical Therapy

## 2019-09-22 ENCOUNTER — Other Ambulatory Visit: Payer: Self-pay

## 2019-09-22 DIAGNOSIS — M25572 Pain in left ankle and joints of left foot: Secondary | ICD-10-CM | POA: Diagnosis not present

## 2019-09-22 DIAGNOSIS — M25551 Pain in right hip: Secondary | ICD-10-CM

## 2019-09-22 DIAGNOSIS — R2689 Other abnormalities of gait and mobility: Secondary | ICD-10-CM

## 2019-09-22 NOTE — Therapy (Signed)
[facility] [address] [place], [place], [address] Phone: [phone]   Fax:  [phone]  Physical Therapy Treatment  Patient Details  Name: Taylor Buchanan MRN: [id number] Date of Birth: 1955/11/20 Referring Provider (PT): Billey Chang   Encounter Date: 09/22/2019  PT End of Session - 09/22/19 1202    Visit Number  3    Number of Visits  12    Date for PT Re-Evaluation  10/27/19    Authorization Type  UHC    PT Start Time  1019    PT Stop Time  1105    PT Time Calculation (min)  46 min    Activity Tolerance  Patient tolerated treatment well    Behavior During Therapy  [facility] for tasks assessed/performed       Past Medical History:  Diagnosis Date  . Osteoporosis, unspecified 09/02/2013  . Scoliosis 04/18/2019    Past Surgical History:  Procedure Laterality Date  . CHOLECYSTECTOMY    . FOOT SURGERY      There were no vitals filed for this visit.  Subjective Assessment - 09/22/19 1201    Subjective  Pt states much improved hip pain since last session    Currently in Pain?  Yes    Pain Score  4     Pain Location  Hip    Pain Orientation  Right    Pain Descriptors / Indicators  Aching    Pain Type  Chronic pain    Pain Onset  More than a month ago    Pain Frequency  Intermittent    Pain Score  4    Pain Location  Foot    Pain Orientation  Left    Pain Descriptors / Indicators  Aching    Pain Type  Chronic pain    Pain Onset  More than a month ago    Pain Frequency  Intermittent                       [facility] Adult PT Treatment/Exercise - 09/22/19 1028      Ambulation/Gait   Gait Comments  Decreased stance time on L, increased toe extension on L, decreased push off on L.       Posture/Postural Control   Posture Comments  Significant scoliosis of thoracic spine, S curve.; Significant pronation on L foot.       Exercises   Exercises  Knee/Hip      Knee/Hip Exercises: Stretches   ITB Stretch  2  reps;30 seconds    ITB Stretch Limitations  standing at wall    Piriformis Stretch  --    Piriformis Stretch Limitations  --      Knee/Hip Exercises: Aerobic   Stationary Bike  L1 x 6 min      Knee/Hip Exercises: Standing   Hip Abduction  2 sets;10 reps;Both    Other Standing Knee Exercises  March x20;     Other Standing Knee Exercises  Pre-gait weight shifts, staggered stance.       Knee/Hip Exercises: Supine   Constance Haw  --    Bridges with Clamshell  20 reps    Other Supine Knee/Hip Exercises  Clams RTB x20; alternating       Knee/Hip Exercises: Sidelying   Hip ABduction  Both;15 reps    Clams  --      Modalities   Modalities  Iontophoresis      Iontophoresis   Type of Iontophoresis  Dexamethasone  Location  R lateral hip    Time  6 hr patch      Manual Therapy   Manual Therapy  Soft tissue mobilization    Soft tissue mobilization  DTM and IASTm to lateral hip and glute             PT Education - 09/22/19 1201    Education Details  HEP updated    Person(s) Educated  Patient    Methods  Explanation;Demonstration;Verbal cues;Handout    Comprehension  Verbalized understanding;Returned demonstration       PT Short Term Goals - 09/15/19 1504      PT SHORT TERM GOAL #1   Title  Pt to be independent with initial HEP    Time  2    Period  Weeks    Status  New    Target Date  09/29/19      PT SHORT TERM GOAL #2   Title  Pt to report decreased pain in R hip, to 3/10        PT Long Term Goals - 09/15/19 1524      PT LONG TERM GOAL #1   Title  Pt to be independent with final HEP    Time  6    Period  Weeks    Target Date  10/27/19      PT LONG TERM GOAL #2   Title  Pt to report decreased pain in R hip, to 0-2/10 with activity    Time  6    Period  Weeks    Status  New    Target Date  10/27/19      PT LONG TERM GOAL #3   Title  Pt to report decreased pain in L foot, to 0-3/10 with  walking activity    Time  6    Period  Weeks    Status  New     Target Date  10/27/19      PT LONG TERM GOAL #4   Title  Pt to demo improved strength of bil hips to at least 4+/5, to improve stability and pain.    Time  6    Period  Weeks    Status  New    Target Date  10/27/19      PT LONG TERM GOAL #5   Title  Pt to report ability to walk up to 1 mile, without increased pain in R hip.    Time  6    Period  Weeks    Status  New    Target Date  10/27/19            Plan - 09/22/19 1203    Clinical Impression Statement  Pt with soreness with DTM and palpation of latera/post hip, but does have improved pain with activity. She is challenged with all ther ex today, especially for hip abd, and for SLS/march , due to weakness. Pt to benefit from continued progression of strength.    Personal Factors and Comorbidities  Comorbidity 1    Comorbidities  L foot pain, Osteoporosis, Scoliosis, OA,    Examination-Activity Limitations  Locomotion Level;Stairs;Stand    Examination-Participation Restrictions  Cleaning;Community Activity    Stability/Clinical Decision Making  Evolving/Moderate complexity    Rehab Potential  Good    PT Frequency  2x / week    PT Duration  6 weeks    PT Treatment/Interventions  ADLs/Self Care Home Management;Cryotherapy;Electrical Stimulation;DME Instruction;Ultrasound;Moist Heat;Iontophoresis 4mg /ml Dexamethasone;Gait training;Stair training;Functional mobility training;Therapeutic activities;Therapeutic exercise;Balance  training;Neuromuscular re-education;Patient/family education;Passive range of motion;Dry needling;Joint Manipulations;Spinal Manipulations;Taping;Manual techniques;Traction    Consulted and Agree with Plan of Care  Patient       Patient will benefit from skilled therapeutic intervention in order to improve the following deficits and impairments:  Abnormal gait, Decreased range of motion, Difficulty walking, Impaired UE functional use, Increased muscle spasms, Decreased activity tolerance, Pain, Improper body  mechanics, Postural dysfunction, Decreased strength, Decreased mobility, Impaired flexibility, Decreased balance  Visit Diagnosis: Pain in right hip  Pain in left ankle and joints of left foot  Other abnormalities of gait and mobility     Problem List Patient Active Problem List   Diagnosis Date Noted  . Scoliosis 04/18/2019  . Osteoporosis 09/02/2013   Lyndee Hensen, PT, DPT 12:04 PM  09/22/19    [employer], [place], [address] Phone: [phone]   Fax:  [phone]  Name: Taylor Buchanan MRN: [id number] Date of Birth: 20-Dec-1955

## 2019-09-24 ENCOUNTER — Encounter: Payer: Self-pay | Admitting: Physical Therapy

## 2019-09-24 ENCOUNTER — Other Ambulatory Visit: Payer: Self-pay

## 2019-09-24 ENCOUNTER — Ambulatory Visit (INDEPENDENT_AMBULATORY_CARE_PROVIDER_SITE_OTHER): Payer: 59 | Admitting: Physical Therapy

## 2019-09-24 DIAGNOSIS — M25551 Pain in right hip: Secondary | ICD-10-CM

## 2019-09-24 DIAGNOSIS — R2689 Other abnormalities of gait and mobility: Secondary | ICD-10-CM

## 2019-09-24 DIAGNOSIS — M25572 Pain in left ankle and joints of left foot: Secondary | ICD-10-CM

## 2019-09-24 NOTE — Therapy (Signed)
[facility] [address] [place], [place], [address] Phone: [phone]   Fax:  [phone]  Physical Therapy Treatment  Patient Details  Name: Taylor Buchanan MRN: [id number] Date of Birth: [birth year] Referring Provider (PT): Billey Chang   Encounter Date: 09/24/2019  PT End of Session - 09/24/19 1221    Visit Number  4    Number of Visits  12    Date for PT Re-Evaluation  10/27/19    Authorization Type  [employer]    PT Start Time  1109    PT Stop Time  1147    PT Time Calculation (min)  38 min    Activity Tolerance  Patient tolerated treatment well    Behavior During Therapy  [facility] for tasks assessed/performed       Past Medical History:  Diagnosis Date  . Osteoporosis, unspecified 09/02/2013  . Scoliosis 04/18/2019    Past Surgical History:  Procedure Laterality Date  . CHOLECYSTECTOMY    . FOOT SURGERY      There were no vitals filed for this visit.  Subjective Assessment - 09/24/19 1219    Subjective  Pt states hip is a bit sore today, but overall still improving.    Currently in Pain?  Yes    Pain Score  3     Pain Location  Hip    Pain Orientation  Right    Pain Descriptors / Indicators  Aching    Pain Type  Chronic pain    Pain Onset  More than a month ago    Pain Frequency  Intermittent                       [facility] Adult PT Treatment/Exercise - 09/24/19 1112      Ambulation/Gait   Gait Comments  Decreased stance time on L, increased toe extension on L, decreased push off on L.       Posture/Postural Control   Posture Comments  Significant scoliosis of thoracic spine, S curve.; Significant pronation on L foot.       Exercises   Exercises  Knee/Hip      Knee/Hip Exercises: Stretches   ITB Stretch  --    ITB Stretch Limitations  --      Knee/Hip Exercises: Aerobic   Stationary Bike  L2 x 6 min      Knee/Hip Exercises: Standing   Heel Raises  20 reps    Heel Raises Limitations  with ball at  ankles    Hip Abduction  2 sets;10 reps;Both    Abduction Limitations  YTB    Functional Squat  20 reps    Other Standing Knee Exercises  Walk/March x 20;     Other Standing Knee Exercises  --      Knee/Hip Exercises: Seated   Other Seated Knee/Hip Exercises  Seated heel raises x15;       Knee/Hip Exercises: Supine   Bridges with Clamshell  20 reps    Other Supine Knee/Hip Exercises  --      Knee/Hip Exercises: Sidelying   Hip ABduction  --    Clams  x15      Modalities   Modalities  Iontophoresis      Iontophoresis   Type of Iontophoresis  --    Location  --    Time  --      Manual Therapy   Manual Therapy  Soft tissue mobilization    Soft tissue mobilization  STM/Roller to R hip.                PT Short Term Goals - 09/15/19 1504      PT SHORT TERM GOAL #1   Title  Pt to be independent with initial HEP    Time  2    Period  Weeks    Status  New    Target Date  09/29/19      PT SHORT TERM GOAL #2   Title  Pt to report decreased pain in R hip, to 3/10        PT Long Term Goals - 09/15/19 1524      PT LONG TERM GOAL #1   Title  Pt to be independent with final HEP    Time  6    Period  Weeks    Target Date  10/27/19      PT LONG TERM GOAL #2   Title  Pt to report decreased pain in R hip, to 0-2/10 with activity    Time  6    Period  Weeks    Status  New    Target Date  10/27/19      PT LONG TERM GOAL #3   Title  Pt to report decreased pain in L foot, to 0-3/10 with  walking activity    Time  6    Period  Weeks    Status  New    Target Date  10/27/19      PT LONG TERM GOAL #4   Title  Pt to demo improved strength of bil hips to at least 4+/5, to improve stability and pain.    Time  6    Period  Weeks    Status  New    Target Date  10/27/19      PT LONG TERM GOAL #5   Title  Pt to report ability to walk up to 1 mile, without increased pain in R hip.    Time  6    Period  Weeks    Status  New    Target Date  10/27/19             Plan - 09/24/19 1222    Clinical Impression Statement  Pt with soreness at greater trochanter, but pain/sensitivity with DTM is decreasing. Continued progression of ther ex for hip strength and stabilization, pt challenged with all exercises, due to weakness. Discussed strengthening for foot,for HEP, discusse orthotic options, pt with significant over pronation of L foot, with pain in lateral ankle, an instability with gait stemming from this.    Personal Factors and Comorbidities  Comorbidity 1    Comorbidities  L foot pain, Osteoporosis, Scoliosis, OA,    Examination-Activity Limitations  Locomotion Level;Stairs;Stand    Examination-Participation Restrictions  Cleaning;Community Activity    Stability/Clinical Decision Making  Evolving/Moderate complexity    Rehab Potential  Good    PT Frequency  2x / week    PT Duration  6 weeks    PT Treatment/Interventions  ADLs/Self Care Home Management;Cryotherapy;Electrical Stimulation;DME Instruction;Ultrasound;Moist Heat;Iontophoresis 4mg /ml Dexamethasone;Gait training;Stair training;Functional mobility training;Therapeutic activities;Therapeutic exercise;Balance training;Neuromuscular re-education;Patient/family education;Passive range of motion;Dry needling;Joint Manipulations;Spinal Manipulations;Taping;Manual techniques;Traction    Consulted and Agree with Plan of Care  Patient       Patient will benefit from skilled therapeutic intervention in order to improve the following deficits and impairments:  Abnormal gait, Decreased range of motion, Difficulty walking, Impaired UE functional use, Increased muscle spasms, Decreased activity tolerance,  Pain, Improper body mechanics, Postural dysfunction, Decreased strength, Decreased mobility, Impaired flexibility, Decreased balance  Visit Diagnosis: Pain in right hip  Pain in left ankle and joints of left foot  Other abnormalities of gait and mobility     Problem List Patient Active  Problem List   Diagnosis Date Noted  . Scoliosis 04/18/2019  . Osteoporosis 09/02/2013   Lyndee Hensen, PT, DPT 12:26 PM  09/24/19    [place], [place], [address] Phone: [phone]   Fax:  [phone]  Name: Taylor Buchanan MRN: [id number] Date of Birth: Dec 10, 1955

## 2019-09-25 ENCOUNTER — Encounter: Payer: 59 | Admitting: Physical Therapy

## 2019-09-29 ENCOUNTER — Ambulatory Visit (INDEPENDENT_AMBULATORY_CARE_PROVIDER_SITE_OTHER): Payer: 59 | Admitting: Physical Therapy

## 2019-09-29 ENCOUNTER — Encounter: Payer: Self-pay | Admitting: Physical Therapy

## 2019-09-29 ENCOUNTER — Other Ambulatory Visit: Payer: Self-pay

## 2019-09-29 DIAGNOSIS — R2689 Other abnormalities of gait and mobility: Secondary | ICD-10-CM

## 2019-09-29 DIAGNOSIS — M25551 Pain in right hip: Secondary | ICD-10-CM

## 2019-09-29 DIAGNOSIS — M25572 Pain in left ankle and joints of left foot: Secondary | ICD-10-CM

## 2019-09-30 NOTE — Therapy (Signed)
[facility] [address] [place], [place], [address] Phone: [phone]   Fax:  [phone]  Physical Therapy Treatment  Patient Details  Name: Taylor Buchanan MRN: [id number] Date of Birth: May 07, 1956 Referring Provider (PT): Billey Chang   Encounter Date: 09/29/2019  PT End of Session - 09/29/19 1029    Visit Number  5    Number of Visits  12    Date for PT Re-Evaluation  10/27/19    Authorization Type  UHC    PT Start Time  [id number]    PT Stop Time  1059    PT Time Calculation (min)  41 min    Activity Tolerance  Patient tolerated treatment well    Behavior During Therapy  [facility] for tasks assessed/performed       Past Medical History:  Diagnosis Date  . Osteoporosis, unspecified 09/02/2013  . Scoliosis 04/18/2019    Past Surgical History:  Procedure Laterality Date  . CHOLECYSTECTOMY    . FOOT SURGERY      There were no vitals filed for this visit.  Subjective Assessment - 09/29/19 1028    Subjective  Pt states pain in hip is doing better, mild soreness. L foot is very painful today, thinks she over did activity over the weekend. Has had history of stress fractures in the past.    Currently in Pain?  Yes    Pain Score  3     Pain Location  Hip    Pain Orientation  Right    Pain Descriptors / Indicators  Aching    Pain Type  Chronic pain    Pain Onset  More than a month ago    Pain Frequency  Intermittent    Pain Score  5    Pain Location  Foot    Pain Orientation  Left    Pain Descriptors / Indicators  Aching    Pain Type  Chronic pain    Pain Onset  More than a month ago    Pain Frequency  Intermittent                       [facility] Adult PT Treatment/Exercise - 09/30/19 0001      Self-Care   Self-Care  Other Self-Care Comments    Other Self-Care Comments   Discussed orthotic use, newest orthotics fitted into shoes.       Knee/Hip Exercises: Aerobic   Stationary Bike  L2 x 8 min      Knee/Hip  Exercises: Standing   Hip Abduction  2 sets;10 reps;Both    Functional Squat  20 reps    Other Standing Knee Exercises  March x20;       Knee/Hip Exercises: Sidelying   Hip ABduction  20 reps    Clams  x20 R      Iontophoresis   Type of Iontophoresis  Dexamethasone    Location  R lateral hip    Time  6 hr patch      Manual Therapy   Soft tissue mobilization  DTM/IASTM to glute and lateral hip                PT Short Term Goals - 09/15/19 1504      PT SHORT TERM GOAL #1   Title  Pt to be independent with initial HEP    Time  2    Period  Weeks    Status  New    Target Date  09/29/19      PT SHORT TERM GOAL #2   Title  Pt to report decreased pain in R hip, to 3/10        PT Long Term Goals - 09/15/19 1524      PT LONG TERM GOAL #1   Title  Pt to be independent with final HEP    Time  6    Period  Weeks    Target Date  10/27/19      PT LONG TERM GOAL #2   Title  Pt to report decreased pain in R hip, to 0-2/10 with activity    Time  6    Period  Weeks    Status  New    Target Date  10/27/19      PT LONG TERM GOAL #3   Title  Pt to report decreased pain in L foot, to 0-3/10 with  walking activity    Time  6    Period  Weeks    Status  New    Target Date  10/27/19      PT LONG TERM GOAL #4   Title  Pt to demo improved strength of bil hips to at least 4+/5, to improve stability and pain.    Time  6    Period  Weeks    Status  New    Target Date  10/27/19      PT LONG TERM GOAL #5   Title  Pt to report ability to walk up to 1 mile, without increased pain in R hip.    Time  6    Period  Weeks    Status  New    Target Date  10/27/19            Plan - 09/30/19 1400    Clinical Impression Statement  Pt with has tightness, but decreased tenderness in hip rotators today with DTM. Soreness at gr trochanter addressed with DTM and Ionto. Pt brought other orthotics that she has, newest pair put into pts shoes with good fit. Discussed decreasing  ankle exercises and activity in next few days, due to foot pain. Ther ex progressed for hip strenght and stability.    Personal Factors and Comorbidities  Comorbidity 1    Comorbidities  L foot pain, Osteoporosis, Scoliosis, OA,    Examination-Activity Limitations  Locomotion Level;Stairs;Stand    Examination-Participation Restrictions  Cleaning;Community Activity    Stability/Clinical Decision Making  Evolving/Moderate complexity    Rehab Potential  Good    PT Frequency  2x / week    PT Duration  6 weeks    PT Treatment/Interventions  ADLs/Self Care Home Management;Cryotherapy;Electrical Stimulation;DME Instruction;Ultrasound;Moist Heat;Iontophoresis 4mg /ml Dexamethasone;Gait training;Stair training;Functional mobility training;Therapeutic activities;Therapeutic exercise;Balance training;Neuromuscular re-education;Patient/family education;Passive range of motion;Dry needling;Joint Manipulations;Spinal Manipulations;Taping;Manual techniques;Traction    Consulted and Agree with Plan of Care  Patient       Patient will benefit from skilled therapeutic intervention in order to improve the following deficits and impairments:  Abnormal gait, Decreased range of motion, Difficulty walking, Impaired UE functional use, Increased muscle spasms, Decreased activity tolerance, Pain, Improper body mechanics, Postural dysfunction, Decreased strength, Decreased mobility, Impaired flexibility, Decreased balance  Visit Diagnosis: Pain in right hip  Pain in left ankle and joints of left foot  Other abnormalities of gait and mobility     Problem List Patient Active Problem List   Diagnosis Date Noted  . Scoliosis 04/18/2019  . Osteoporosis 09/02/2013    Lyndee Hensen, PT, DPT 2:05  PM  09/30/19    [facility] [place], [place], [address] Phone: [phone]   Fax:  [phone]  Name: Taylor Buchanan MRN: [id number] Date of Birth:  10/23/1955

## 2019-10-02 ENCOUNTER — Ambulatory Visit (INDEPENDENT_AMBULATORY_CARE_PROVIDER_SITE_OTHER): Payer: 59 | Admitting: Physical Therapy

## 2019-10-02 ENCOUNTER — Other Ambulatory Visit: Payer: Self-pay

## 2019-10-02 ENCOUNTER — Encounter: Payer: Self-pay | Admitting: Physical Therapy

## 2019-10-02 DIAGNOSIS — R2689 Other abnormalities of gait and mobility: Secondary | ICD-10-CM | POA: Diagnosis not present

## 2019-10-02 DIAGNOSIS — M25572 Pain in left ankle and joints of left foot: Secondary | ICD-10-CM

## 2019-10-02 DIAGNOSIS — M25551 Pain in right hip: Secondary | ICD-10-CM | POA: Diagnosis not present

## 2019-10-02 NOTE — Therapy (Addendum)
[facility] [address] [place], [place], [address] Phone: [phone]   Fax:  [phone]  Physical Therapy Treatment   Patient Details  Name: Taylor Buchanan MRN: [id number] Date of Birth: [birth year] Referring Provider (PT): Billey Chang   Encounter Date: 10/02/2019  PT End of Session - 10/02/19 1026    Visit Number  6    Number of Visits  12    Date for PT Re-Evaluation  10/27/19    Authorization Type  UHC    PT Start Time  1017    PT Stop Time  1055    PT Time Calculation (min)  38 min    Activity Tolerance  Patient tolerated treatment well    Behavior During Therapy  [facility] for tasks assessed/performed       Past Medical History:  Diagnosis Date  . Osteoporosis, unspecified 09/02/2013  . Scoliosis 04/18/2019    Past Surgical History:  Procedure Laterality Date  . CHOLECYSTECTOMY    . FOOT SURGERY      There were no vitals filed for this visit.  Subjective Assessment - 10/02/19 1022    Subjective  Pt states increased pain a couple days ago, was dong a lot of work outside, but feels better yesterday and today. Notes less pain in hips and foot.    Currently in Pain?  Yes    Pain Score  2     Pain Location  Hip    Pain Orientation  Right    Pain Descriptors / Indicators  Aching    Pain Type  Chronic pain    Pain Onset  More than a month ago    Pain Frequency  Intermittent    Multiple Pain Sites  Yes    Pain Score  3    Pain Location  Foot    Pain Orientation  Left    Pain Descriptors / Indicators  Aching    Pain Type  Chronic pain    Pain Onset  More than a month ago    Pain Frequency  Intermittent                       [facility] Adult PT Treatment/Exercise - 10/02/19 1027      Self-Care   Self-Care  Other Self-Care Comments    Other Self-Care Comments   Discussed orthotic use, newest orthotics fitted into shoes.       Knee/Hip Exercises: Stretches   Piriformis Stretch  2 reps;30 seconds     Piriformis Stretch Limitations  supine      Knee/Hip Exercises: Aerobic   Stationary Bike  L2 x 7 min      Knee/Hip Exercises: Standing   Hip Abduction  --    Functional Squat  --    Other Standing Knee Exercises  Walk/March, slow 10 ft x4;     Other Standing Knee Exercises  Tandem stance w head turns x20 bi;       Knee/Hip Exercises: Supine   Bridges with Ball Squeeze  20 reps    Other Supine Knee/Hip Exercises  CLam, alternating, x20 GTB;       Knee/Hip Exercises: Sidelying   Hip ABduction  --    Clams  x20 bil    Other Sidelying Knee/Hip Exercises  Side plank 10 sec x3 bil;       Iontophoresis   Type of Iontophoresis  --    Location  --    Time  --  Manual Therapy   Manual therapy comments  skilled palpation and monitoring of soft tissue with dry needling.     Soft tissue mobilization  TPR to R glute       Trigger Point Dry Needling - 10/02/19 0001    Consent Given?  Yes    Education Handout Provided  Yes    Muscles Treated Back/Hip  Gluteus medius;Piriformis    Other Dry Needling  R hip ERs   response; palpable increased muscle length   Gluteus Medius Response  Palpable increased muscle length    Piriformis Response  Palpable increased muscle length             PT Short Term Goals - 09/15/19 1504      PT SHORT TERM GOAL #1   Title  Pt to be independent with initial HEP    Time  2    Period  Weeks    Status  New    Target Date  09/29/19      PT SHORT TERM GOAL #2   Title  Pt to report decreased pain in R hip, to 3/10        PT Long Term Goals - 09/15/19 1524      PT LONG TERM GOAL #1   Title  Pt to be independent with final HEP    Time  6    Period  Weeks    Target Date  10/27/19      PT LONG TERM GOAL #2   Title  Pt to report decreased pain in R hip, to 0-2/10 with activity    Time  6    Period  Weeks    Status  New    Target Date  10/27/19      PT LONG TERM GOAL #3   Title  Pt to report decreased pain in L foot, to 0-3/10 with   walking activity    Time  6    Period  Weeks    Status  New    Target Date  10/27/19      PT LONG TERM GOAL #4   Title  Pt to demo improved strength of bil hips to at least 4+/5, to improve stability and pain.    Time  6    Period  Weeks    Status  New    Target Date  10/27/19      PT LONG TERM GOAL #5   Title  Pt to report ability to walk up to 1 mile, without increased pain in R hip.    Time  6    Period  Weeks    Status  New    Target Date  10/27/19            Plan - 10/02/19 1056    Clinical Impression Statement  Pt progressing well. She is very challenged with strenght and stabilization ther ex, due to weakness in R hip. Has been able to progress ther ex and HEP. Decreased soreness in lateral hip today. TIghtness in hip rotators addressed with DN, pt with good tolerance. Plan to progress as tolerated, will assess effects of DN next week.    Personal Factors and Comorbidities  Comorbidity 1    Comorbidities  L foot pain, Osteoporosis, Scoliosis, OA,    Examination-Activity Limitations  Locomotion Level;Stairs;Stand    Examination-Participation Restrictions  Cleaning;Community Activity    Stability/Clinical Decision Making  Evolving/Moderate complexity    Rehab Potential  Good    PT Frequency  2x / week    PT Duration  6 weeks    PT Treatment/Interventions  ADLs/Self Care Home Management;Cryotherapy;Electrical Stimulation;DME Instruction;Ultrasound;Moist Heat;Iontophoresis 40m/ml Dexamethasone;Gait training;Stair training;Functional mobility training;Therapeutic activities;Therapeutic exercise;Balance training;Neuromuscular re-education;Patient/family education;Passive range of motion;Dry needling;Joint Manipulations;Spinal Manipulations;Taping;Manual techniques;Traction    Consulted and Agree with Plan of Care  Patient       Patient will benefit from skilled therapeutic intervention in order to improve the following deficits and impairments:  Abnormal gait, Decreased  range of motion, Difficulty walking, Impaired UE functional use, Increased muscle spasms, Decreased activity tolerance, Pain, Improper body mechanics, Postural dysfunction, Decreased strength, Decreased mobility, Impaired flexibility, Decreased balance  Visit Diagnosis: Pain in right hip  Pain in left ankle and joints of left foot  Other abnormalities of gait and mobility     Problem List Patient Active Problem List   Diagnosis Date Noted  . Scoliosis 04/18/2019  . Osteoporosis 09/02/2013    LLyndee Hensen PT, DPT 11:00 AM  10/02/19    [facility] [address]Phone: [phone]  Fax:  [phone] Name: Taylor GUNKELMRN: [id number] of Birth: 102-15-1957  PHYSICAL THERAPY DISCHARGE SUMMARY  Visits from Start of Care: 6 Plan: Patient agrees to discharge.  Patient goals were met. Patient is being discharged due to being pleased with the current functional level.  ?????     Pt did not return for final visits, called pt, states she is doing great, with minimal/no pain. Requests d/c.   LLyndee Hensen PT, DPT 2:47 PM  01/05/20

## 2019-10-06 ENCOUNTER — Encounter: Payer: 59 | Admitting: Physical Therapy

## 2019-10-09 ENCOUNTER — Encounter: Payer: 59 | Admitting: Physical Therapy

## 2019-10-10 ENCOUNTER — Encounter: Payer: 59 | Admitting: Physical Therapy

## 2019-11-08 ENCOUNTER — Other Ambulatory Visit: Payer: Self-pay | Admitting: Family Medicine

## 2019-12-29 NOTE — Telephone Encounter (Signed)
error 

## 2020-02-26 ENCOUNTER — Other Ambulatory Visit: Payer: Self-pay

## 2020-02-26 ENCOUNTER — Ambulatory Visit (INDEPENDENT_AMBULATORY_CARE_PROVIDER_SITE_OTHER): Payer: Managed Care, Other (non HMO)

## 2020-02-26 DIAGNOSIS — Z23 Encounter for immunization: Secondary | ICD-10-CM

## 2020-02-26 NOTE — Progress Notes (Signed)
Per orders of Dr. Jonni Sanger , injection of 2nd Shingrix given by [facility] in left deltoid. Patient tolerated injection well.

## 2020-03-16 ENCOUNTER — Ambulatory Visit: Payer: 59

## 2020-03-23 ENCOUNTER — Ambulatory Visit: Payer: Managed Care, Other (non HMO)

## 2020-03-30 ENCOUNTER — Ambulatory Visit: Payer: Managed Care, Other (non HMO)

## 2020-04-06 ENCOUNTER — Other Ambulatory Visit: Payer: Self-pay

## 2020-04-06 ENCOUNTER — Ambulatory Visit (INDEPENDENT_AMBULATORY_CARE_PROVIDER_SITE_OTHER): Payer: Managed Care, Other (non HMO)

## 2020-04-06 DIAGNOSIS — M810 Age-related osteoporosis without current pathological fracture: Secondary | ICD-10-CM

## 2020-04-06 MED ORDER — DENOSUMAB 60 MG/ML ~~LOC~~ SOSY
60.0000 mg | PREFILLED_SYRINGE | Freq: Once | SUBCUTANEOUS | Status: AC
  Administered 2020-04-06: 60 mg via SUBCUTANEOUS

## 2020-04-06 NOTE — Progress Notes (Signed)
Per orders of Dr. Jonni Sanger , injection of Prolia given by [facility] in left deltoid. Patient tolerated injection well. Patient will make appointment for 6 month and 1 day.

## 2020-04-27 ENCOUNTER — Telehealth: Payer: Self-pay | Admitting: Family Medicine

## 2020-04-27 NOTE — Telephone Encounter (Signed)
Patient is calling regarding a bill for proilia shot first shot was 1/19 and every time she gets a prolia injection she is getting billed $2000 dollar patient states she did not have knowledge of this or she doesn't think it is correct and has already talked to billing and billing stated it might be coded wrong  Please return call at [phone]

## 2020-04-29 NOTE — Telephone Encounter (Signed)
Taylor Buchanan, can you please reach out to [facility] on this? Thanks!

## 2020-05-10 NOTE — Telephone Encounter (Signed)
Email has been sent to [facility]

## 2020-08-05 ENCOUNTER — Other Ambulatory Visit: Payer: Self-pay

## 2020-08-05 ENCOUNTER — Ambulatory Visit (INDEPENDENT_AMBULATORY_CARE_PROVIDER_SITE_OTHER): Payer: Managed Care, Other (non HMO)

## 2020-08-05 DIAGNOSIS — Z23 Encounter for immunization: Secondary | ICD-10-CM | POA: Diagnosis not present

## 2020-08-09 ENCOUNTER — Other Ambulatory Visit: Payer: Self-pay | Admitting: Family Medicine

## 2020-08-10 ENCOUNTER — Ambulatory Visit: Payer: Managed Care, Other (non HMO)

## 2020-08-21 DIAGNOSIS — I639 Cerebral infarction, unspecified: Secondary | ICD-10-CM

## 2020-08-21 HISTORY — DX: Cerebral infarction, unspecified: I63.9

## 2020-09-01 ENCOUNTER — Ambulatory Visit (INDEPENDENT_AMBULATORY_CARE_PROVIDER_SITE_OTHER): Payer: Managed Care, Other (non HMO)

## 2020-09-01 ENCOUNTER — Other Ambulatory Visit: Payer: Self-pay

## 2020-09-01 ENCOUNTER — Ambulatory Visit (INDEPENDENT_AMBULATORY_CARE_PROVIDER_SITE_OTHER): Payer: Managed Care, Other (non HMO) | Admitting: Podiatry

## 2020-09-01 DIAGNOSIS — M79671 Pain in right foot: Secondary | ICD-10-CM | POA: Diagnosis not present

## 2020-09-01 DIAGNOSIS — M778 Other enthesopathies, not elsewhere classified: Secondary | ICD-10-CM | POA: Diagnosis not present

## 2020-09-01 DIAGNOSIS — M79672 Pain in left foot: Secondary | ICD-10-CM

## 2020-09-01 DIAGNOSIS — M722 Plantar fascial fibromatosis: Secondary | ICD-10-CM

## 2020-09-01 MED ORDER — TRIAMCINOLONE ACETONIDE 10 MG/ML IJ SUSP
10.0000 mg | Freq: Once | INTRAMUSCULAR | Status: AC
  Administered 2020-09-01: 10 mg

## 2020-09-01 NOTE — Progress Notes (Signed)
Subjective:   Patient ID: Taylor Buchanan, female   DOB: 65 y.o.   MRN: [id number]   HPI Patient states she is developed a lot of pain in the outside of her right foot and then the bottom of her left foot.  States both of them hurt her and make it hard for her to walk comfortably and states that this has been ongoing   ROS      Objective:  Physical Exam  Neurovascular status intact with inflammation pain right foot peroneal insertion base of fifth metatarsal left foot within the mid arch medial with moderate to severe collapse of the medial longitudinal arch left and is basically walking on the bone structure.  Patient is found to have good digital perfusion well oriented x3     Assessment:  Peroneal tendinitis right and acute plantar fasciitis left      Plan:  H&P reviewed condition and x-rays and today did sterile prep injected the base of the fifth metatarsal right 3 mg dexamethasone Kenalog 5 mg Xylocaine and for the left injected the mid arch 3 mg Kenalog 5 mg Xylocaine discussed possible new orthotics depending on response to this treatment  X-rays indicate collapse medial longitudinal arch left no indications of stress fracture or bony issue except for the structure of the feet

## 2021-01-19 DIAGNOSIS — Z8673 Personal history of transient ischemic attack (TIA), and cerebral infarction without residual deficits: Secondary | ICD-10-CM

## 2021-01-19 HISTORY — DX: Personal history of transient ischemic attack (TIA), and cerebral infarction without residual deficits: Z86.73

## 2021-01-29 ENCOUNTER — Emergency Department (HOSPITAL_BASED_OUTPATIENT_CLINIC_OR_DEPARTMENT_OTHER): Payer: Managed Care, Other (non HMO)

## 2021-01-29 ENCOUNTER — Encounter (HOSPITAL_BASED_OUTPATIENT_CLINIC_OR_DEPARTMENT_OTHER): Payer: Self-pay

## 2021-01-29 ENCOUNTER — Observation Stay (HOSPITAL_BASED_OUTPATIENT_CLINIC_OR_DEPARTMENT_OTHER)
Admission: EM | Admit: 2021-01-29 | Discharge: 2021-01-31 | Disposition: A | Discharge status: Home / Self Care | Payer: Managed Care, Other (non HMO) | Attending: Obstetrics and Gynecology | Admitting: Obstetrics and Gynecology

## 2021-01-29 DIAGNOSIS — G459 Transient cerebral ischemic attack, unspecified: Secondary | ICD-10-CM | POA: Diagnosis not present

## 2021-01-29 DIAGNOSIS — Z87891 Personal history of nicotine dependence: Secondary | ICD-10-CM | POA: Diagnosis not present

## 2021-01-29 DIAGNOSIS — Z20822 Contact with and (suspected) exposure to covid-19: Secondary | ICD-10-CM | POA: Insufficient documentation

## 2021-01-29 DIAGNOSIS — R531 Weakness: Secondary | ICD-10-CM | POA: Diagnosis present

## 2021-01-29 DIAGNOSIS — R479 Unspecified speech disturbances: Secondary | ICD-10-CM

## 2021-01-29 LAB — PROTIME-INR
INR: 0.9 (ref 0.8–1.2)
Prothrombin Time: 11.6 seconds (ref 11.4–15.2)

## 2021-01-29 LAB — COMPREHENSIVE METABOLIC PANEL
ALT: 12 U/L (ref 0–44)
AST: 23 U/L (ref 15–41)
Albumin: 4.4 g/dL (ref 3.5–5.0)
Alkaline Phosphatase: 72 U/L (ref 38–126)
Anion gap: 12 (ref 5–15)
BUN: 17 mg/dL (ref 8–23)
CO2: 23 mmol/L (ref 22–32)
Calcium: 9.6 mg/dL (ref 8.9–10.3)
Chloride: 102 mmol/L (ref 98–111)
Creatinine, Ser: 0.78 mg/dL (ref 0.44–1.00)
GFR, Estimated: >60 mL/min (ref >60)
Glucose, Bld: 91 mg/dL (ref 70–99)
Potassium: 3.6 mmol/L (ref 3.5–5.1)
Sodium: 137 mmol/L (ref 135–145)
Total Bilirubin: 0.4 mg/dL (ref 0.3–1.2)
Total Protein: 7.3 g/dL (ref 6.5–8.1)

## 2021-01-29 LAB — CBC WITH DIFFERENTIAL/PLATELET
Abs Immature Granulocytes: 0.02 10*3/uL (ref 0.00–0.07)
Basophils Absolute: 0.1 10*3/uL (ref 0.0–0.1)
Basophils Relative: 1 %
Eosinophils Absolute: 0.2 10*3/uL (ref 0.0–0.5)
Eosinophils Relative: 2 %
HCT: 40.7 % (ref 36.0–46.0)
Hemoglobin: 13.8 g/dL (ref 12.0–15.0)
Immature Granulocytes: 0 %
Lymphocytes Relative: 29 %
Lymphs Abs: 2.2 10*3/uL (ref 0.7–4.0)
MCH: 32.5 pg (ref 26.0–34.0)
MCHC: 33.9 g/dL (ref 30.0–36.0)
MCV: 96 fL (ref 80.0–100.0)
Monocytes Absolute: 0.5 10*3/uL (ref 0.1–1.0)
Monocytes Relative: 7 %
Neutro Abs: 4.7 10*3/uL (ref 1.7–7.7)
Neutrophils Relative %: 61 %
Platelets: 311 10*3/uL (ref 150–400)
RBC: 4.24 MIL/uL (ref 3.87–5.11)
RDW: 13.2 % (ref 11.5–15.5)
WBC: 7.7 10*3/uL (ref 4.0–10.5)
nRBC: 0 % (ref 0.0–0.2)

## 2021-01-29 LAB — CBG MONITORING, ED: Glucose-Capillary: 96 mg/dL (ref 70–99)

## 2021-01-29 MED ORDER — IOHEXOL 350 MG/ML SOLN
75.0000 mL | Freq: Once | INTRAVENOUS | Status: AC | PRN
  Administered 2021-01-29: 75 mL via INTRAVENOUS

## 2021-01-29 NOTE — ED Provider Notes (Signed)
[facility] EMERGENCY DEPT Provider Note   CSN: [id number] Arrival date & time:     An emergency department physician performed an initial assessment on this suspected stroke patient at 88.  History Chief Complaint  Patient presents with   Dizziness   Aphasia    Taylor Buchanan is a 65 y.o. female.  The history is provided by the patient, the spouse and medical records. No language interpreter was used.  Neurologic Problem This is a new problem. The current episode started 1 to 2 hours ago. The problem occurs rarely. The problem has been resolved. Pertinent negatives include no chest pain, no abdominal pain, no headaches and no shortness of breath. Nothing aggravates the symptoms. Nothing relieves the symptoms. She has tried nothing for the symptoms. The treatment provided no relief.      Past Medical History:  Diagnosis Date   Osteoporosis, unspecified 09/02/2013   Scoliosis 04/18/2019    Patient Active Problem List   Diagnosis Date Noted   Scoliosis 04/18/2019   Osteoporosis 09/02/2013    Past Surgical History:  Procedure Laterality Date   CHOLECYSTECTOMY     FOOT SURGERY       OB History   No obstetric history on file.     Family History  Problem Relation Age of Onset   Mental illness Mother    Stroke Mother    Dementia Mother    Bladder Cancer Father    Healthy Brother    Healthy Son    Healthy Son    Healthy Brother    Healthy Brother     Social History   Tobacco Use   Smoking status: Former    Pack years: 0.00   Smokeless tobacco: Never   Tobacco comments:    1 ppd for 15 years, quit 20 years ago  Substance Use Topics   Alcohol use: Yes    Comment: occ    Drug use: Never    Home Medications Prior to Admission medications   Medication Sig Start Date End Date Taking? Authorizing Provider  calcium carbonate (OS-CAL) 600 MG TABS tablet Take 600 mg by mouth 2 (two) times daily with a meal.    [provider]   cholecalciferol (VITAMIN D) 1000 UNITS tablet Take 1,000 Units by mouth daily.    [provider]  denosumab (PROLIA) 60 MG/ML SOSY injection Prolia 60 mg/mL subcutaneous syringe  Inject 1 mL by subcutaneous route.    [provider]  fluticasone (FLONASE) 50 MCG/ACT nasal spray Place 1 spray into both nostrils daily. Patient not taking: Reported on 08/29/2019 10/29/17   Billie Ruddy, MD  Multiple Vitamin (MULTIVITAMIN) capsule Take 1 capsule by mouth daily.    [provider]  Omega-3 Fatty Acids (FISH OIL PO) Take by mouth.    [provider]  omeprazole (PRILOSEC) 20 MG capsule TAKE 1 CAPSULE(20 MG) BY MOUTH DAILY 08/09/20   Leamon Arnt, MD    Allergies    Patient has no known allergies.  Review of Systems   Review of Systems  Constitutional:  Negative for chills, fatigue and fever.  HENT:  Negative for congestion.   Eyes:  Negative for visual disturbance.  Respiratory:  Negative for cough, chest tightness, shortness of breath and wheezing.   Cardiovascular:  Negative for chest pain and palpitations.  Gastrointestinal:  Negative for abdominal pain, constipation, diarrhea, nausea and vomiting.  Genitourinary:  Negative for dysuria and frequency.  Musculoskeletal:  Negative for back pain, neck pain and  neck stiffness.  Skin:  Negative for rash and wound.  Neurological:  Positive for speech difficulty, weakness and numbness. Negative for headaches.  Psychiatric/Behavioral:  Negative for agitation and confusion.   All other systems reviewed and are negative.  Physical Exam Updated Vital Signs BP (!) 127/96 (BP Location: Right Arm)   Pulse 90   Temp 97.7 F (36.5 C) (Oral)   Resp 18   Ht 5\' 6"  (1.676 m)   Wt 75 kg   LMP 08/21/2002   SpO2 100%   BMI 26.69 kg/m   Physical Exam Vitals and nursing note reviewed.  Constitutional:      General: She is not in acute distress.    Appearance: She is well-developed. She is not ill-appearing,  toxic-appearing or diaphoretic.  HENT:     Head: Normocephalic and atraumatic.     Nose: No congestion or rhinorrhea.     Mouth/Throat:     Mouth: Mucous membranes are moist.  Eyes:     Conjunctiva/sclera: Conjunctivae normal.     Pupils: Pupils are equal, round, and reactive to light.  Cardiovascular:     Rate and Rhythm: Normal rate and regular rhythm.     Heart sounds: No murmur heard. Pulmonary:     Effort: Pulmonary effort is normal. No respiratory distress.     Breath sounds: Normal breath sounds. No wheezing, rhonchi or rales.  Chest:     Chest wall: No tenderness.  Abdominal:     Palpations: Abdomen is soft.     Tenderness: There is no abdominal tenderness. There is no right CVA tenderness, left CVA tenderness, guarding or rebound.  Musculoskeletal:        General: No tenderness.     Cervical back: Neck supple. No tenderness.  Skin:    General: Skin is warm and dry.     Findings: No erythema.  Neurological:     General: No focal deficit present.     Mental Status: She is alert and oriented to person, place, and time.     Cranial Nerves: No cranial nerve deficit.     Sensory: No sensory deficit.     Motor: No weakness.     Coordination: Coordination normal.  Psychiatric:        Mood and Affect: Mood normal.    ED Results / Procedures / Treatments   Labs (all labs ordered are listed, but only abnormal results are displayed) Labs Reviewed  TSH - Abnormal; Notable for the following components:      Result Value   TSH 6.574 (*)    All other components within normal limits  RESP PANEL BY RT-PCR (FLU A&B, COVID) ARPGX2  CBC WITH DIFFERENTIAL/PLATELET  COMPREHENSIVE METABOLIC PANEL  PROTIME-INR  CBG MONITORING, ED    EKG EKG Interpretation  Date/Time:  Saturday January 29 2021 20:18:40 EDT Ventricular Rate:  67 PR Interval:  148 QRS Duration: 82 QT Interval:  400 QTC Calculation: 422 R Axis:   35 Text Interpretation: Normal sinus rhythm Normal ECG no prior  ECG for comparison. No STEMI Confirmed by Antony Blackbird [phone]) on 01/29/2021 8:31:31 PM  Radiology CT Angio Head W or Wo Contrast  Result Date: 01/29/2021 CLINICAL DATA:  Initial evaluation for neuro deficit, stroke suspected. EXAM: CT ANGIOGRAPHY HEAD AND NECK TECHNIQUE: Multidetector CT imaging of the head and neck was performed using the standard protocol during bolus administration of intravenous contrast. Multiplanar CT image reconstructions and MIPs were obtained to evaluate the vascular anatomy. Carotid stenosis measurements (when applicable)  are obtained utilizing NASCET criteria, using the distal internal carotid diameter as the denominator. CONTRAST:  89mL OMNIPAQUE IOHEXOL 350 MG/ML SOLN COMPARISON:  None. FINDINGS: CT HEAD FINDINGS Brain: Cerebral volume within normal limits for patient age. Small remote right cerebellar infarct noted. No evidence for acute intracranial hemorrhage. No findings to suggest acute large vessel territory infarct. No mass lesion, midline shift, or mass effect. Ventricles are normal in size without evidence for hydrocephalus. No extra-axial fluid collection identified. Vascular: No hyperdense vessel identified. Skull: Scalp soft tissues demonstrate no acute abnormality. Calvarium intact. Sinuses/Orbits: Globes and orbital soft tissues within normal limits. Visualized paranasal sinuses are clear. No mastoid effusion. CTA NECK FINDINGS Aortic arch: Arch visualized aortic arch of normal caliber with normal 3 vessel morphology. No hemodynamically significant stenosis about the origin of the great vessels. Right carotid system: Right common and internal carotid arteries widely patent without stenosis, dissection or occlusion. Left carotid system: Left common and internal carotid arteries widely patent without stenosis, dissection or occlusion. Vertebral arteries: Both vertebral arteries arise from the subclavian arteries. No proximal subclavian artery stenosis. Both vertebral  arteries widely patent without stenosis, dissection or occlusion. Skeleton: No visible acute osseous finding. No discrete or worrisome osseous lesions. Cervicothoracic scoliosis noted. Other neck: No other acute soft tissue abnormality within the neck. No mass or adenopathy. Upper chest: Visualized upper chest demonstrates no acute finding. Review of the MIP images confirms the above findings CTA HEAD FINDINGS Anterior circulation: Both internal carotid arteries widely patent to the termini without stenosis. A1 segments widely patent. Normal anterior communicating artery complex. Both anterior cerebral arteries widely patent to their distal aspects without stenosis. No M1 stenosis or occlusion. Normal MCA bifurcations. Distal MCA branches well perfused and symmetric. Posterior circulation: Both V4 segments patent to the vertebrobasilar junction without stenosis. Both PICA origins patent and normal. Basilar widely patent to its distal aspect without stenosis. Superior cerebellar arteries patent bilaterally. Left PCA primarily supplied via the basilar. Predominant fetal type origin of the right PCA. Both PCAs well perfused to their distal aspects without stenosis. Venous sinuses: Patent. Anatomic variants: Fetal type origin of the right PCA.  No aneurysm. Review of the MIP images confirms the above findings IMPRESSION: 1. Negative CTA of the head and neck. No large vessel occlusion, hemodynamically significant stenosis, or other acute vascular abnormality. 2. Small remote right cerebellar infarct. No other acute intracranial abnormality. Electronically Signed   By: Jeannine Boga M.D.   On: 01/29/2021 22:32   CT Angio Neck W and/or Wo Contrast  Result Date: 01/29/2021 CLINICAL DATA:  Initial evaluation for neuro deficit, stroke suspected. EXAM: CT ANGIOGRAPHY HEAD AND NECK TECHNIQUE: Multidetector CT imaging of the head and neck was performed using the standard protocol during bolus administration of  intravenous contrast. Multiplanar CT image reconstructions and MIPs were obtained to evaluate the vascular anatomy. Carotid stenosis measurements (when applicable) are obtained utilizing NASCET criteria, using the distal internal carotid diameter as the denominator. CONTRAST:  16mL OMNIPAQUE IOHEXOL 350 MG/ML SOLN COMPARISON:  None. FINDINGS: CT HEAD FINDINGS Brain: Cerebral volume within normal limits for patient age. Small remote right cerebellar infarct noted. No evidence for acute intracranial hemorrhage. No findings to suggest acute large vessel territory infarct. No mass lesion, midline shift, or mass effect. Ventricles are normal in size without evidence for hydrocephalus. No extra-axial fluid collection identified. Vascular: No hyperdense vessel identified. Skull: Scalp soft tissues demonstrate no acute abnormality. Calvarium intact. Sinuses/Orbits: Globes and orbital soft tissues within normal limits.  Visualized paranasal sinuses are clear. No mastoid effusion. CTA NECK FINDINGS Aortic arch: Arch visualized aortic arch of normal caliber with normal 3 vessel morphology. No hemodynamically significant stenosis about the origin of the great vessels. Right carotid system: Right common and internal carotid arteries widely patent without stenosis, dissection or occlusion. Left carotid system: Left common and internal carotid arteries widely patent without stenosis, dissection or occlusion. Vertebral arteries: Both vertebral arteries arise from the subclavian arteries. No proximal subclavian artery stenosis. Both vertebral arteries widely patent without stenosis, dissection or occlusion. Skeleton: No visible acute osseous finding. No discrete or worrisome osseous lesions. Cervicothoracic scoliosis noted. Other neck: No other acute soft tissue abnormality within the neck. No mass or adenopathy. Upper chest: Visualized upper chest demonstrates no acute finding. Review of the MIP images confirms the above findings  CTA HEAD FINDINGS Anterior circulation: Both internal carotid arteries widely patent to the termini without stenosis. A1 segments widely patent. Normal anterior communicating artery complex. Both anterior cerebral arteries widely patent to their distal aspects without stenosis. No M1 stenosis or occlusion. Normal MCA bifurcations. Distal MCA branches well perfused and symmetric. Posterior circulation: Both V4 segments patent to the vertebrobasilar junction without stenosis. Both PICA origins patent and normal. Basilar widely patent to its distal aspect without stenosis. Superior cerebellar arteries patent bilaterally. Left PCA primarily supplied via the basilar. Predominant fetal type origin of the right PCA. Both PCAs well perfused to their distal aspects without stenosis. Venous sinuses: Patent. Anatomic variants: Fetal type origin of the right PCA.  No aneurysm. Review of the MIP images confirms the above findings IMPRESSION: 1. Negative CTA of the head and neck. No large vessel occlusion, hemodynamically significant stenosis, or other acute vascular abnormality. 2. Small remote right cerebellar infarct. No other acute intracranial abnormality. Electronically Signed   By: Jeannine Boga M.D.   On: 01/29/2021 22:32    Procedures Procedures   Medications Ordered in ED Medications  iohexol (OMNIPAQUE) 350 MG/ML injection 75 mL (75 mLs Intravenous Contrast Given 01/29/21 2122)    ED Course  I have reviewed the triage vital signs and the nursing notes.  Pertinent labs & imaging results that were available during my care of the patient were reviewed by me and considered in my medical decision making (see chart for details).    MDM Rules/Calculators/A&P                          Taylor Buchanan is a 65 y.o. female with a past medical history significant for scoliosis and osteoporosis who presents with transient neurologic deficits.  Apparently at 6:30 PM, patient had sudden onset of dysarthria and  left arm numbness and weakness.  Episode lasted for around a minute or 2 and she is back to baseline.  No reported neck pain neck stiffness, or headache with it.  She reported feeling lightheaded but was not dizzy during the episode.  She denies history of intracranial abnormalities or any neck problems.  No recent chiropractor, massage, or other neck manipulation or trauma.  She denied any palpitations, chest pain, shortness of breath, or other symptoms.  No recent medication changes or other complaints.  On exam, she has no focal neurologic deficits.  Normal sensation and strength and pulses in all extremities.  Symmetric smile.  Clear speech.  Pupils are symmetric reactive normal extraocular movements.  Normal finger-nose-finger testing bilaterally.  No carotid bruit appreciated on my exam.  Patient otherwise well-appearing.  Symptoms  sound very much like a TIA.  I spoke with neurology who recommends CTA head and neck and labs and she will need admission to hospital at [facility] for further management of TIA.  Patient already took 324 of aspirin at home.  Will place the orders and she will be admitted after work-up is completed.  We will continue to monitor for any recurrent symptoms.  Patient's work-up from a lab standpoint returned reassuring however the CTA did show possible old stroke.  Patient denies any history of this.  No evidence of LVO, dissection, or acute stroke seen.  Neurology recommended admission to medicine service for TIA work-up.  Medicine team will be called for admission now.  Patient is being admitted to medicine.  While awaiting admission, when patient falls asleep her oxygen saturation dipped to around 88 or 89% on room air.  Husband reports that he is unsure if this is new or not.  She will be placed on some oxygen we will get a chest x-ray but she will still be admitted.   Final Clinical Impression(s) / ED Diagnoses Final diagnoses:  Transient speech disturbance      Clinical Impression: 1. Transient speech disturbance     Disposition: Admit  This note was prepared with assistance of Dragon voice recognition software. Occasional wrong-word or sound-a-like substitutions may have occurred due to the inherent limitations of voice recognition software.     Arianny Pun, Gwenyth Allegra, MD 01/30/21 [phone]

## 2021-01-29 NOTE — ED Notes (Signed)
Patient transported to CT 

## 2021-01-29 NOTE — Plan of Care (Signed)
[facility] will assume care on arrival to accepting facility. Until arrival, care as per EDP. However, [facility] available 24/7 for questions and assistance.  

## 2021-01-29 NOTE — ED Triage Notes (Signed)
Pt states at 1830 she was in the kitchen when she became lightheaded and dizzy. Pt states her left arm went numb and she was unable to speak or form a sentence for her husband. Pt states her sx lasted appx. One minute and felt fine shortly after. Pt states she is currently lightheaded and nauseous.   Pt had two beers this afternoon.

## 2021-01-30 ENCOUNTER — Emergency Department (HOSPITAL_BASED_OUTPATIENT_CLINIC_OR_DEPARTMENT_OTHER): Payer: Managed Care, Other (non HMO)

## 2021-01-30 ENCOUNTER — Encounter (HOSPITAL_COMMUNITY): Payer: Self-pay | Admitting: Internal Medicine

## 2021-01-30 ENCOUNTER — Observation Stay (HOSPITAL_COMMUNITY): Payer: Managed Care, Other (non HMO)

## 2021-01-30 ENCOUNTER — Other Ambulatory Visit (HOSPITAL_COMMUNITY): Payer: Managed Care, Other (non HMO)

## 2021-01-30 DIAGNOSIS — R531 Weakness: Secondary | ICD-10-CM

## 2021-01-30 DIAGNOSIS — G459 Transient cerebral ischemic attack, unspecified: Secondary | ICD-10-CM

## 2021-01-30 DIAGNOSIS — Z20822 Contact with and (suspected) exposure to covid-19: Secondary | ICD-10-CM | POA: Diagnosis not present

## 2021-01-30 DIAGNOSIS — Z87891 Personal history of nicotine dependence: Secondary | ICD-10-CM | POA: Diagnosis not present

## 2021-01-30 LAB — CBC
HCT: 39.5 % (ref 36.0–46.0)
Hemoglobin: 13.5 g/dL (ref 12.0–15.0)
MCH: 32.5 pg (ref 26.0–34.0)
MCHC: 34.2 g/dL (ref 30.0–36.0)
MCV: 95.2 fL (ref 80.0–100.0)
Platelets: 334 10*3/uL (ref 150–400)
RBC: 4.15 MIL/uL (ref 3.87–5.11)
RDW: 13.2 % (ref 11.5–15.5)
WBC: 9 10*3/uL (ref 4.0–10.5)
nRBC: 0 % (ref 0.0–0.2)

## 2021-01-30 LAB — LIPID PANEL
Cholesterol: 187 mg/dL (ref 0–200)
HDL: 51 mg/dL (ref >40)
LDL Cholesterol: 123 mg/dL — ABNORMAL HIGH (ref 0–99)
Total CHOL/HDL Ratio: 3.7 RATIO
Triglycerides: 67 mg/dL (ref <150)
VLDL: 13 mg/dL (ref 0–40)

## 2021-01-30 LAB — CREATININE, SERUM
Creatinine, Ser: 0.77 mg/dL (ref 0.44–1.00)
GFR, Estimated: >60 mL/min (ref >60)

## 2021-01-30 LAB — RESP PANEL BY RT-PCR (FLU A&B, COVID) ARPGX2
Influenza A by PCR: NEGATIVE
Influenza B by PCR: NEGATIVE
SARS Coronavirus 2 by RT PCR: NEGATIVE

## 2021-01-30 LAB — T4, FREE: Free T4: 0.56 ng/dL — ABNORMAL LOW (ref 0.61–1.12)

## 2021-01-30 LAB — HIV ANTIBODY (ROUTINE TESTING W REFLEX): HIV Screen 4th Generation wRfx: NONREACTIVE

## 2021-01-30 LAB — TSH: TSH: 6.574 u[IU]/mL — ABNORMAL HIGH (ref 0.350–4.500)

## 2021-01-30 MED ORDER — CLOPIDOGREL BISULFATE 75 MG PO TABS
75.0000 mg | ORAL_TABLET | Freq: Every day | ORAL | Status: DC
  Administered 2021-01-31: 75 mg via ORAL
  Filled 2021-01-30: qty 1

## 2021-01-30 MED ORDER — ACETAMINOPHEN 650 MG RE SUPP: 650.0000 mg | RECTAL | Status: DC | PRN

## 2021-01-30 MED ORDER — STROKE: EARLY STAGES OF RECOVERY BOOK
Freq: Once | Status: AC
  Filled 2021-01-30 (×2): qty 1

## 2021-01-30 MED ORDER — SODIUM CHLORIDE 0.9 % IV SOLN: INTRAVENOUS | Status: DC

## 2021-01-30 MED ORDER — ASPIRIN 300 MG RE SUPP
300.0000 mg | Freq: Every day | RECTAL | Status: DC
  Filled 2021-01-30: qty 1

## 2021-01-30 MED ORDER — ENOXAPARIN SODIUM 40 MG/0.4ML IJ SOSY
40.0000 mg | PREFILLED_SYRINGE | INTRAMUSCULAR | Status: DC
  Administered 2021-01-30 – 2021-01-31 (×2): 40 mg via SUBCUTANEOUS
  Filled 2021-01-30 (×2): qty 0.4

## 2021-01-30 MED ORDER — ASPIRIN EC 81 MG PO TBEC
81.0000 mg | DELAYED_RELEASE_TABLET | Freq: Every day | ORAL | Status: DC
  Administered 2021-01-31: 81 mg via ORAL
  Filled 2021-01-30: qty 1

## 2021-01-30 MED ORDER — ASPIRIN 325 MG PO TABS
325.0000 mg | ORAL_TABLET | Freq: Every day | ORAL | Status: DC
  Administered 2021-01-30: 325 mg via ORAL
  Filled 2021-01-30: qty 1

## 2021-01-30 MED ORDER — ACETAMINOPHEN 325 MG PO TABS
650.0000 mg | ORAL_TABLET | ORAL | Status: DC | PRN
  Administered 2021-01-30: 650 mg via ORAL
  Filled 2021-01-30: qty 2

## 2021-01-30 MED ORDER — ATORVASTATIN CALCIUM 40 MG PO TABS
40.0000 mg | ORAL_TABLET | Freq: Every day | ORAL | Status: DC
  Administered 2021-01-30 – 2021-01-31 (×2): 40 mg via ORAL
  Filled 2021-01-30 (×2): qty 1

## 2021-01-30 MED ORDER — ACETAMINOPHEN 160 MG/5ML PO SOLN: 650.0000 mg | ORAL | Status: DC | PRN

## 2021-01-30 NOTE — H&P (Signed)
History and Physical    Taylor Buchanan:[id number] DOB: 24-Dec-1955 DOA: 01/29/2021  PCP: Leamon Arnt, MD  Patient coming from: Home.  Chief Complaint: Left upper extremity weakness.  HPI: Taylor Buchanan is a 65 y.o. female with prior history of tobacco abuse quit for 20 years now presents to the ER after patient had brief episode of left upper extremity weakness and dizziness and also a brief episode of aphasia all which lasted less than 2 minutes.  Happened around 6:30 PM while patient was having dinner.  Patient was brought to the ER by patient's husband.  ED Course: In the ER patient appears nonfocal.  ER physician discussed with on-call neurologist recommending CT angiogram of head and neck which was not showing any large vessel obstruction.  Patient was admitted for further work-up for TIA.  EKG shows normal sinus rhythm COVID test was negative.  Labs unremarkable.  On my exam patient appears nonfocal.  Review of Systems: As per HPI, rest all negative.   Past Medical History:  Diagnosis Date   Osteoporosis, unspecified 09/02/2013   Scoliosis 04/18/2019    Past Surgical History:  Procedure Laterality Date   CHOLECYSTECTOMY     FOOT SURGERY       reports that she has quit smoking. She has never used smokeless tobacco. She reports current alcohol use. She reports that she does not use drugs.  No Known Allergies  Family History  Problem Relation Age of Onset   Mental illness Mother    Stroke Mother    Dementia Mother    Bladder Cancer Father    Healthy Brother    Healthy Son    Healthy Son    Healthy Brother    Healthy Brother     Prior to Admission medications   Medication Sig Start Date End Date Taking? Authorizing Provider  calcium carbonate (OS-CAL) 600 MG TABS tablet Take 600 mg by mouth 2 (two) times daily with a meal.    [provider]  cholecalciferol (VITAMIN D) 1000 UNITS tablet Take 1,000 Units by mouth daily.    [provider]   denosumab (PROLIA) 60 MG/ML SOSY injection Prolia 60 mg/mL subcutaneous syringe  Inject 1 mL by subcutaneous route.    [provider]  fluticasone (FLONASE) 50 MCG/ACT nasal spray Place 1 spray into both nostrils daily. Patient not taking: Reported on 08/29/2019 10/29/17   Billie Ruddy, MD  Multiple Vitamin (MULTIVITAMIN) capsule Take 1 capsule by mouth daily.    [provider]  Omega-3 Fatty Acids (FISH OIL PO) Take by mouth.    [provider]  omeprazole (PRILOSEC) 20 MG capsule TAKE 1 CAPSULE(20 MG) BY MOUTH DAILY 08/09/20   Leamon Arnt, MD    Physical Exam: Constitutional: Moderately built and nourished. Vitals:   01/29/21 2230 01/29/21 2245 01/29/21 2330 01/30/21 0200  BP: 113/70 124/64 125/79   Pulse: 82 78 73   Resp: 13 17 (!) 21   Temp:    99 F (37.2 C)  TempSrc:    Oral  SpO2: 96% 99% 99%   Weight:    76 kg  Height:    5' 6.5" (1.689 m)   Eyes: Anicteric no pallor. ENMT: No discharge from the ears eyes nose or mouth. Neck: No mass felt.  No neck rigidity. Respiratory: No rhonchi or crepitations. Cardiovascular: S1-S2 heard. Abdomen: Soft nontender bowel sound present. Musculoskeletal: No edema. Skin: No rash. Neurologic: Alert awake oriented to time place and person.  Moves all extremities 5 x 5.  No facial asymmetry tongue is midline pupils are equal and reacting to light. Psychiatric: Appears normal.  Normal affect.   Labs on Admission: I have personally reviewed following labs and imaging studies  CBC: Recent Labs  Lab 01/29/21 2023  WBC 7.7  NEUTROABS 4.7  HGB 13.8  HCT 40.7  MCV 96.0  PLT 710   Basic Metabolic Panel: Recent Labs  Lab 01/29/21 2023  NA 137  K 3.6  CL 102  CO2 23  GLUCOSE 91  BUN 17  CREATININE 0.78  CALCIUM 9.6   GFR: Estimated Creatinine Clearance: 74.8 mL/min (by C-G formula based on SCr of 0.78 mg/dL). Liver Function Tests: Recent Labs  Lab 01/29/21 2023  AST 23  ALT 12  ALKPHOS  72  BILITOT 0.4  PROT 7.3  ALBUMIN 4.4   No results for input(s): LIPASE, AMYLASE in the last 168 hours. No results for input(s): AMMONIA in the last 168 hours. Coagulation Profile: Recent Labs  Lab 01/29/21 2023  INR 0.9   Cardiac Enzymes: No results for input(s): CKTOTAL, CKMB, CKMBINDEX, TROPONINI in the last 168 hours. BNP (last 3 results) No results for input(s): PROBNP in the last 8760 hours. HbA1C: No results for input(s): HGBA1C in the last 72 hours. CBG: Recent Labs  Lab 01/29/21 2026  GLUCAP 96   Lipid Profile: No results for input(s): CHOL, HDL, LDLCALC, TRIG, CHOLHDL, LDLDIRECT in the last 72 hours. Thyroid Function Tests: Recent Labs    01/29/21 2023  TSH 6.574*   Anemia Panel: No results for input(s): VITAMINB12, FOLATE, FERRITIN, TIBC, IRON, RETICCTPCT in the last 72 hours. Urine analysis: No results found for: COLORURINE, APPEARANCEUR, LABSPEC, PHURINE, GLUCOSEU, HGBUR, BILIRUBINUR, KETONESUR, PROTEINUR, UROBILINOGEN, NITRITE, LEUKOCYTESUR Sepsis Labs: @LABRCNTIP (procalcitonin:4,lacticidven:4) ) Recent Results (from the past 240 hour(s))  Resp Panel by RT-PCR (Flu A&B, Covid) Nasopharyngeal Swab     Status: None   Collection Time: 01/29/21 11:22 PM   Specimen: Nasopharyngeal Swab; Nasopharyngeal(NP) swabs in vial transport medium  Result Value Ref Range Status   SARS Coronavirus 2 by RT PCR NEGATIVE NEGATIVE Final    Comment: (NOTE) SARS-CoV-2 target nucleic acids are NOT DETECTED.  The SARS-CoV-2 RNA is generally detectable in upper respiratory specimens during the acute phase of infection. The lowest concentration of SARS-CoV-2 viral copies this assay can detect is 138 copies/mL. A negative result does not preclude SARS-Cov-2 infection and should not be used as the sole basis for treatment or other patient management decisions. A negative result may occur with  improper specimen collection/handling, submission of specimen other than  nasopharyngeal swab, presence of viral mutation(s) within the areas targeted by this assay, and inadequate number of viral copies(<138 copies/mL). A negative result must be combined with clinical observations, patient history, and epidemiological information. The expected result is Negative.  Fact Sheet for Patients:  EntrepreneurPulse.com.au  Fact Sheet for Healthcare Providers:  IncredibleEmployment.be  This test is no t yet approved or cleared by the [place] FDA and  has been authorized for detection and/or diagnosis of SARS-CoV-2 by FDA under an Emergency Use Authorization (EUA). This EUA will remain  in effect (meaning this test can be used) for the duration of the COVID-19 declaration under Section 564(b)(1) of the Act, 21 U.S.C.section 360bbb-3(b)(1), unless the authorization is terminated  or revoked sooner.       Influenza A by PCR NEGATIVE NEGATIVE Final   Influenza B by PCR NEGATIVE NEGATIVE Final    Comment: (NOTE) The  Xpert Xpress SARS-CoV-2/FLU/RSV plus assay is intended as an aid in the diagnosis of influenza from Nasopharyngeal swab specimens and should not be used as a sole basis for treatment. Nasal washings and aspirates are unacceptable for Xpert Xpress SARS-CoV-2/FLU/RSV testing.  Fact Sheet for Patients: EntrepreneurPulse.com.au  Fact Sheet for Healthcare Providers: IncredibleEmployment.be  This test is not yet approved or cleared by the [place] FDA and has been authorized for detection and/or diagnosis of SARS-CoV-2 by FDA under an Emergency Use Authorization (EUA). This EUA will remain in effect (meaning this test can be used) for the duration of the COVID-19 declaration under Section 564(b)(1) of the Act, 21 U.S.C. section 360bbb-3(b)(1), unless the authorization is terminated or revoked.  Performed at [facility], [address] [place], [place] [address]      Radiological Exams on Admission: CT Angio Head W or Wo Contrast  Result Date: 01/29/2021 CLINICAL DATA:  Initial evaluation for neuro deficit, stroke suspected. EXAM: CT ANGIOGRAPHY HEAD AND NECK TECHNIQUE: Multidetector CT imaging of the head and neck was performed using the standard protocol during bolus administration of intravenous contrast. Multiplanar CT image reconstructions and MIPs were obtained to evaluate the vascular anatomy. Carotid stenosis measurements (when applicable) are obtained utilizing NASCET criteria, using the distal internal carotid diameter as the denominator. CONTRAST:  25mL OMNIPAQUE IOHEXOL 350 MG/ML SOLN COMPARISON:  None. FINDINGS: CT HEAD FINDINGS Brain: Cerebral volume within normal limits for patient age. Small remote right cerebellar infarct noted. No evidence for acute intracranial hemorrhage. No findings to suggest acute large vessel territory infarct. No mass lesion, midline shift, or mass effect. Ventricles are normal in size without evidence for hydrocephalus. No extra-axial fluid collection identified. Vascular: No hyperdense vessel identified. Skull: Scalp soft tissues demonstrate no acute abnormality. Calvarium intact. Sinuses/Orbits: Globes and orbital soft tissues within normal limits. Visualized paranasal sinuses are clear. No mastoid effusion. CTA NECK FINDINGS Aortic arch: Arch visualized aortic arch of normal caliber with normal 3 vessel morphology. No hemodynamically significant stenosis about the origin of the great vessels. Right carotid system: Right common and internal carotid arteries widely patent without stenosis, dissection or occlusion. Left carotid system: Left common and internal carotid arteries widely patent without stenosis, dissection or occlusion. Vertebral arteries: Both vertebral arteries arise from the subclavian arteries. No proximal subclavian artery stenosis. Both vertebral arteries widely patent  without stenosis, dissection or occlusion. Skeleton: No visible acute osseous finding. No discrete or worrisome osseous lesions. Cervicothoracic scoliosis noted. Other neck: No other acute soft tissue abnormality within the neck. No mass or adenopathy. Upper chest: Visualized upper chest demonstrates no acute finding. Review of the MIP images confirms the above findings CTA HEAD FINDINGS Anterior circulation: Both internal carotid arteries widely patent to the termini without stenosis. A1 segments widely patent. Normal anterior communicating artery complex. Both anterior cerebral arteries widely patent to their distal aspects without stenosis. No M1 stenosis or occlusion. Normal MCA bifurcations. Distal MCA branches well perfused and symmetric. Posterior circulation: Both V4 segments patent to the vertebrobasilar junction without stenosis. Both PICA origins patent and normal. Basilar widely patent to its distal aspect without stenosis. Superior cerebellar arteries patent bilaterally. Left PCA primarily supplied via the basilar. Predominant fetal type origin of the right PCA. Both PCAs well perfused to their distal aspects without stenosis. Venous sinuses: Patent. Anatomic variants: Fetal type origin of the right PCA.  No aneurysm. Review of the MIP images confirms the above findings IMPRESSION: 1. Negative CTA of the head and neck.  No large vessel occlusion, hemodynamically significant stenosis, or other acute vascular abnormality. 2. Small remote right cerebellar infarct. No other acute intracranial abnormality. Electronically Signed   By: Jeannine Boga M.D.   On: 01/29/2021 22:32   CT Angio Neck W and/or Wo Contrast  Result Date: 01/29/2021 CLINICAL DATA:  Initial evaluation for neuro deficit, stroke suspected. EXAM: CT ANGIOGRAPHY HEAD AND NECK TECHNIQUE: Multidetector CT imaging of the head and neck was performed using the standard protocol during bolus administration of intravenous contrast.  Multiplanar CT image reconstructions and MIPs were obtained to evaluate the vascular anatomy. Carotid stenosis measurements (when applicable) are obtained utilizing NASCET criteria, using the distal internal carotid diameter as the denominator. CONTRAST:  61mL OMNIPAQUE IOHEXOL 350 MG/ML SOLN COMPARISON:  None. FINDINGS: CT HEAD FINDINGS Brain: Cerebral volume within normal limits for patient age. Small remote right cerebellar infarct noted. No evidence for acute intracranial hemorrhage. No findings to suggest acute large vessel territory infarct. No mass lesion, midline shift, or mass effect. Ventricles are normal in size without evidence for hydrocephalus. No extra-axial fluid collection identified. Vascular: No hyperdense vessel identified. Skull: Scalp soft tissues demonstrate no acute abnormality. Calvarium intact. Sinuses/Orbits: Globes and orbital soft tissues within normal limits. Visualized paranasal sinuses are clear. No mastoid effusion. CTA NECK FINDINGS Aortic arch: Arch visualized aortic arch of normal caliber with normal 3 vessel morphology. No hemodynamically significant stenosis about the origin of the great vessels. Right carotid system: Right common and internal carotid arteries widely patent without stenosis, dissection or occlusion. Left carotid system: Left common and internal carotid arteries widely patent without stenosis, dissection or occlusion. Vertebral arteries: Both vertebral arteries arise from the subclavian arteries. No proximal subclavian artery stenosis. Both vertebral arteries widely patent without stenosis, dissection or occlusion. Skeleton: No visible acute osseous finding. No discrete or worrisome osseous lesions. Cervicothoracic scoliosis noted. Other neck: No other acute soft tissue abnormality within the neck. No mass or adenopathy. Upper chest: Visualized upper chest demonstrates no acute finding. Review of the MIP images confirms the above findings CTA HEAD FINDINGS  Anterior circulation: Both internal carotid arteries widely patent to the termini without stenosis. A1 segments widely patent. Normal anterior communicating artery complex. Both anterior cerebral arteries widely patent to their distal aspects without stenosis. No M1 stenosis or occlusion. Normal MCA bifurcations. Distal MCA branches well perfused and symmetric. Posterior circulation: Both V4 segments patent to the vertebrobasilar junction without stenosis. Both PICA origins patent and normal. Basilar widely patent to its distal aspect without stenosis. Superior cerebellar arteries patent bilaterally. Left PCA primarily supplied via the basilar. Predominant fetal type origin of the right PCA. Both PCAs well perfused to their distal aspects without stenosis. Venous sinuses: Patent. Anatomic variants: Fetal type origin of the right PCA.  No aneurysm. Review of the MIP images confirms the above findings IMPRESSION: 1. Negative CTA of the head and neck. No large vessel occlusion, hemodynamically significant stenosis, or other acute vascular abnormality. 2. Small remote right cerebellar infarct. No other acute intracranial abnormality. Electronically Signed   By: Jeannine Boga M.D.   On: 01/29/2021 22:32   DG Chest Portable 1 View  Result Date: 01/30/2021 CLINICAL DATA:  Hypoxia EXAM: PORTABLE CHEST 1 VIEW COMPARISON:  None. FINDINGS: Thoracolumbar scoliosis. Hyperinflation of the lungs. Heart is normal size. No confluent opacities or effusions. No acute bony abnormality. IMPRESSION: Hyperinflation.  No active disease. Electronically Signed   By: Rolm Baptise M.D.   On: 01/30/2021 01:29    EKG: Independently  reviewed.  Normal sinus rhythm.  Assessment/Plan Principal Problem:   TIA (transient ischemic attack)    TIA -MRI brain has been ordered.  2D echo.  Neurochecks.  Patient passed stroke swallow.  Aspirin.  Check hemoglobin A1c lipid panel.  Neurology consult. Prior history of tobacco abuse quit  about 20 years ago.   DVT prophylaxis: Lovenox. Code Status: Full code. Family Communication: Discussed with patient. Disposition Plan: Home. Consults called: We will consult neurology. Admission status: Observation.   Rise Patience MD [facility] Pager [phone].  If 7PM-7AM, please contact night-coverage [email or web address] Password [facility]  01/30/2021, 4:31 AM

## 2021-01-30 NOTE — ED Notes (Signed)
[facility] has arrived for patient 

## 2021-01-30 NOTE — ED Notes (Signed)
Attempted to call report to Ronalee Belts, RN and was told to come back in five minutes.

## 2021-01-30 NOTE — Progress Notes (Signed)
Stroke Neurology Progress Note  IDENTIFYING INFORMATION  Taylor Buchanan MR# [id number] 01/30/2021  HISTORY OF PRESENT ILLNESS  Taylor Buchanan is a 65 y.o. female who  has a past medical history of Osteoporosis, unspecified (09/02/2013) and Scoliosis (04/18/2019).  osteoporosis, scoliosis who presents with  a brief episode of dysarthria and LUE floppiness that lasted less than 5 mins and she came in for further evaluation.  INTERVAL HISTORY  She is currently back to baseline and a little anxious.    MEDICATIONS          Current Facility-Administered Medications (Analgesics):    acetaminophen (TYLENOL) tablet 650 mg **OR** acetaminophen (TYLENOL) 160 MG/5ML solution 650 mg **OR** acetaminophen (TYLENOL) suppository 650 mg   aspirin suppository 300 mg **OR** aspirin tablet 325 mg   Current Facility-Administered Medications (Hematological):    clopidogrel (PLAVIX) tablet 75 mg   enoxaparin (LOVENOX) injection 40 mg   Current Facility-Administered Medications (Other):     stroke: mapping our early stages of recovery book   0.9 %  sodium chloride infusion  No current outpatient medications on file.  VITAL SIGNS  Temp:  [97.7 F (36.5 C)-99 F (37.2 C)] 98.7 F (37.1 C) (06/12 0738) Pulse Rate:  [64-90] 64 (06/12 0738) Resp:  [12-21] 18 (06/12 0738) BP: (113-127)/(64-96) 118/74 (06/12 0738) SpO2:  [96 %-100 %] 99 % (06/12 0738) Weight:  [75 kg-76 kg] 76 kg (06/12 0200)  PHYSICAL EXAM   General Physical Exam  General: NAD, lying comfortably in bed HENT: Normal oropharynx and mucosa. Normal external appearance of ears and nose. CV/Chest: No JVD, normal S1S2 Lungs: No audible wheezing. Normal work of breathing. No accessory muscle use Abdomen: Non distended, non tender Extremities: Warm and well perfused. No appreciable edema, cyanosis or deformity. Skin: No rash. Normal palpation of skin.   Musculoskeletal: No joint tenderness. Normal digits and nails by inspection. No  clubbing.  Neurologic Examination  Mental status/Cognition: alert; oriented to month and age; good attention; no apparent neglect Speech/language: fluent; comprehension intact; object naming intact; repetition intact Visual Fields are full. Pupils are equal, round, and reactive to light. EOMI without ptosis or diploplia.  Facial sensation is symmetric to temperature Facial movement is symmetric.  Hearing is intact to voice. Uvula midline and palate elevates symmetrically. Shoulder shrug is symmetric. Tongue is midline without atrophy or fasciculations.  Tone is normal. Bulk is normal. 5/5 strength was present in all four extremities. Sensation is symmetric to light touch and temperature in the arms and legs. Deep Tendon Reflexes: 2+ and symmetric in the biceps and patellae. Toes are downgoing bilaterally. FNF and HKS are intact bilaterally. Rapid alternating movements are normal without dysdiadochokinesia   IMAGING/DIAGNOSTIC STUDIES   I have reviewed the images obtained: NCT head showed no signal attenuation concerning for large territory infarct or hyperdensity concerning for an ICH. Remote right cerebellar stroke. CTA head and neck showed no large vessel occlusion or hemodynamically significant stenosis, or other acute vascular abnormality. MRI brain showed no acute infarction, hemorrhage, hydrocephalus, extra-axial collection or mass lesion. Possible small remote right cerebellar infarct seen on 62mm slice.    No results found for: HGBA1C Lab Results  Component Value Date   LDLCALC 123 (H) 01/30/2021     ASSESSMENT AND PLAN  ?arol I Buchanan is a 65 y.o. female Hx osteoporosis, scoliosis who presents with brief episode of dysarthria and LUE floppiness lasting less than 5 minutes admitted for TIA workup. MRI did not reveal acute ischemic stroke however possibly chronic  right cerebellar stroke.  - Etiology by TOAST Criteria - cardioembolic is very likely as she does not have  significant vascular risk factors and angiography did not reveal significant vascular pathology.  - CT head: no acute ischemic changes, remote right cerebellar infarction. - CTA brain/neck: No large vessel occlusion or hemodynamically significant stenosis. - MRI: No acute finding. . Small remote right cerebellar infarction. - BP: gradually normalize in 5-7 days to goal <140/90 however she says that she normally has low blood pressure. - LDL: 123 - A1C: Pending - TTE: Pending - Statin therapy: Initiate high intensity statin. - Antiplatelet therapy: Aspirin 81mg  and clopidogrel 75mg  daily for 3 weeks then discontinue clopidogrel.   Ischemic Stroke Core Measures -NIHSS on admission 0 -Patient has been started on Mechanical (SCD's) and Pharmacological (SQ heparin/Lovenox) DVT prophylaxis. -Antiplatelet therapy has been initiated. -Dysphagia screening ordered, and will be completed prior to patient receiving oral intake. -Stroke education booklet has been ordered and will be provided by the RN that includes both written and verbal education to the patient and family regarding ischemic strokes. We have reviewed the patient's personal modifiable risk factors. -Patient is being assessed for Rehab by PT/OT/Speech and PM&R if indicated.  We had long discussion about stroke etiology/risk factors and secondary prevention. She is understandably a little anxious and we discussed techniques to reduce her anxiety.  Lynnae Sandhoff, MD Stroke Neurology Page: [phone]

## 2021-01-30 NOTE — Progress Notes (Signed)
Brief same-day note  Patient is a 65 year old female with no significant past medical history presented with left upper extremity weakness, slurred speech.  On presentation she was hemodynamically stable.  Stroke was suspected presentation and neurology was consulted.  Stroke work-up initiated. MRI of the brain did not show any acute stroke but showed small remote cerebellar infarct.  CTA head and neck did not show any large vessel occlusion.  LDL of 123.  A1c is pending.  Patient seen by PT/OT and did not recommend any follow-up.  Echocardiogram is still pending. Patient seen and examined at bedside this morning.  Hemodynamically stable.  No new complaints.  Currently she does not have any left uppe  weakness.  Her speech is clear. We will plan to complete full work-up before discharge.  Patient will also be seen by neurology tomorrow.  Patient has been started on aspirin and Plavix.  Also started on Lipitor. She has mild elevated TSH.  Will check free T3 and T4. Hopefully the patient can be discharged tomorrow after neurology evaluation and Echo report.

## 2021-01-30 NOTE — Consult Note (Signed)
NEUROLOGY CONSULTATION NOTE   Date of service: January 30, 2021 Patient Name: Taylor Buchanan MRN:  [id number] DOB:  [birth year] Reason for consult: "TIA" Requesting Provider: Rise Patience, MD _ _ _   _ __   _ __ _ _  __ __   _ __   __ _  History of Present Illness  Taylor Buchanan is a 65 y.o. female with PMH significant for osteoporosis, scoliosis who presents with  a brief episode of dysarthria and LUE floppiness that lasted less than 5 mins.  She got off her porch, walked over to cook dinner and was cutting zuchini when her L arm went completely limp and sh could not move it. She went over to talk to her husband but her words were slurring and her tongue felt heavy. She did not feel confused at all. No warning symptoms. N lightheadedness or dizziness. No post ictal confusion. This happened around 1830 and she immediately took aspirin and they decided to come to the ED.  She reports drinking 2 beers in the afternoon, have been drinking pretty much everyday all of last week but not prior to that. Smokes marijuana. No other recreational substances.  No prior history of seizures. No hx of head injury or loss of consciousness, no prior CNS surgery, no prior CNS infections. Endorses drinking ~24Oz of water a day.  mRS: 0 tPA/Thrombectomy: No symptoms LKW: 1830 on 01/29/21. NIHSS components Score: Comment  1a Level of Conscious 0[x]  1[]  2[]  3[]      1b LOC Questions 0[x]  1[]  2[]       1c LOC Commands 0[x]  1[]  2[]       2 Best Gaze 0[x]  1[]  2[]       3 Visual 0[x]  1[]  2[]  3[]      4 Facial Palsy 0[x]  1[]  2[]  3[]      5a Motor Arm - left 0[x]  1[]  2[]  3[]  4[]  UN[]    5b Motor Arm - Right 0[x]  1[]  2[]  3[]  4[]  UN[]    6a Motor Leg - Left 0[x]  1[]  2[]  3[]  4[]  UN[]    6b Motor Leg - Right 0[x]  1[]  2[]  3[]  4[]  UN[]    7 Limb Ataxia 0[x]  1[]  2[]  3[]  UN[]     8 Sensory 0[x]  1[]  2[]  UN[]      9 Best Language 0[x]  1[]  2[]  3[]      10 Dysarthria 0[x]  1[]  2[]  UN[]      11 Extinct. and Inattention 0[x]  1[]  2[]        TOTAL: 0      ROS   Constitutional Denies weight loss, fever and chills.   HEENT Denies changes in vision and hearing.   Respiratory Denies SOB and cough.   CV Denies palpitations and CP   GI Denies abdominal pain, nausea, vomiting and diarrhea.   GU Denies dysuria and urinary frequency.   MSK Denies myalgia and joint pain.   Skin Denies rash and pruritus.   Neurological Denies headache and syncope.   Psychiatric Denies recent changes in mood. Denies anxiety and depression.    Past History   Past Medical History:  Diagnosis Date   Osteoporosis, unspecified 09/02/2013   Scoliosis 04/18/2019   Past Surgical History:  Procedure Laterality Date   CHOLECYSTECTOMY     FOOT SURGERY     Family History  Problem Relation Age of Onset   Mental illness Mother    Stroke Mother    Dementia Mother    Bladder Cancer Father    Healthy Brother    Healthy Son  Healthy Son    Healthy Brother    Healthy Brother    Social History   Socioeconomic History   Marital status: Married    Spouse name: Not on file   Number of children: 2   Years of education: Not on file   Highest education level: Not on file  Occupational History    Employer: [employer]  Tobacco Use   Smoking status: Former    Pack years: 0.00   Smokeless tobacco: Never   Tobacco comments:    1 ppd for 15 years, quit 20 years ago  Substance and Sexual Activity   Alcohol use: Yes    Comment: occ    Drug use: Never   Sexual activity: Yes    Birth control/protection: Post-menopausal  Other Topics Concern   Not on file  Social History Narrative   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      cholecalciferol (VITAMIN D) 1000 UNITS tablet Take 1,000 Units by mouth daily.      denosumab (PROLIA) 60 MG/ML SOSY injection Prolia 60 mg/mL subcutaneous syringe  Inject 1 mL by subcutaneous route.      fluticasone (FLONASE) 50 MCG/ACT nasal spray Place 1 spray into both nostrils daily. (Patient not taking: Reported on 08/29/2019) 16 g 0    Multiple Vitamin (MULTIVITAMIN) capsule Take 1 capsule by mouth daily.      Omega-3 Fatty Acids (FISH OIL PO) Take by mouth.      omeprazole (PRILOSEC) 20 MG capsule TAKE 1 CAPSULE(20 MG) BY MOUTH DAILY 90 capsule 3     Vitals   Vitals:   01/29/21 2230 01/29/21 2245 01/29/21 2330 01/30/21 0200  BP: 113/70 124/64 125/79   Pulse: 82 78 73   Resp: 13 17 (!) 21   Temp:    99 F (37.2 C)  TempSrc:    Oral  SpO2: 96% 99% 99%   Weight:    76 kg  Height:    5' 6.5" (1.689 m)     Body mass index is 26.64 kg/m.  Physical Exam   General: Laying comfortably in bed; in no acute distress.  HENT: Normal oropharynx and mucosa. Normal external appearance of ears and nose. Neck: Supple, no pain or tenderness  CV: No JVD. No peripheral edema.  Pulmonary: Symmetric Chest rise. Normal respiratory effort.  Abdomen: Soft to touch, non-tender.  Ext: No cyanosis, edema, or deformity  Skin: No rash. Normal palpation of skin.   Musculoskeletal: Normal digits and nails by inspection. No clubbing.   Neurologic Examination  Mental status/Cognition: Alert, oriented to self, place, month and year, good attention.  Speech/language: Fluent, comprehension intact, object naming intact, repetition intact.  Cranial nerves:   CN II Pupils equal and reactive to light, no VF deficits  CN III,IV,VI EOM intact,  no gaze preference or deviation, no nystagmus    CN V normal sensation in V1, V2, and V3 segments bilaterally    CN VII no asymmetry, no nasolabial fold flattening    CN VIII normal hearing to speech    CN IX & X normal palatal elevation, no uvular deviation    CN XI 5/5 head turn and 5/5 shoulder shrug bilaterally    CN XII midline tongue protrusion    Motor:  Muscle bulk: normal, tone normal, pronator drift none tremor none Mvmt Root Nerve  Muscle Right Left Comments  SA C5/6 Ax Deltoid 5 5   EF C5/6 Mc Biceps 5 5   EE C6/7/8 Rad Triceps 5 5   WF C6/7 Med FCR     WE C7/8 PIN ECU     F Ab C8/T1 U ADM/FDI     HF L1/2/3 Fem Illopsoas     KE L2/3/4 Fem Quad     DF L4/5 D Peron Tib Ant     PF S1/2 Tibial Grc/Sol      Reflexes:  Right Left Comments  Pectoralis      Biceps (C5/6)     Brachioradialis (C5/6)      Triceps (C6/7)      Patellar (L3/4)      Achilles (S1)      Hoffman      Plantar     Jaw jerk    Sensation:  Light touch    Pin prick    Temperature    Vibration   Proprioception    Coordination/Complex Motor:  - Finger to Nose intact - Heel to shin intact - Rapid alternating movement intact - Gait: deferred.  Labs   CBC:  Recent Labs  Lab 01/29/21 2023  WBC 7.7  NEUTROABS 4.7  HGB 13.8  HCT 40.7  MCV 96.0  PLT 353    Basic Metabolic Panel:  Lab Results  Component Value Date   NA 137 01/29/2021   K 3.6 01/29/2021   CO2 23 01/29/2021   GLUCOSE 91 01/29/2021   BUN 17 01/29/2021   CREATININE 0.78 01/29/2021   CALCIUM 9.6 01/29/2021   GFRNONAA >60 01/29/2021   Lipid Panel:  Lab Results  Component Value Date   LDLCALC 147 (H) 08/29/2019   HgbA1c: No results found for: HGBA1C Urine Drug Screen: No results found for: LABOPIA, COCAINSCRNUR, LABBENZ, AMPHETMU, THCU, LABBARB  Alcohol Level No results found for: [place]  CT Head without contrast: Personally reviewed and CTH was negative for a large hypodensity concerning for a large territory  infarct or hyperdensity concerning for an ICH. Remote R cerebellar stroke.  CT angio Head and Neck with contrast: No LVO  MRI Brain: Personally reviewed and Remote R cerebellar stroke, no acute findings.   Impression   Taylor Buchanan is a 65 y.o. female with PMH significant for osteoporosis, scoliosis who presents with a brief episode of dysarthria and LUE floppiness that lasted less than 5 mins. Althou the duration of the episode is somewhat atypical, my highest suspicion is still that this was a TIA. She does have a remote R cerebellar stroke on imaging.  She has no prior hx of seizures and it would be odd to have a first time seizure at her age. She did not have any shaking or stiffening, no warning signs, no seizure risk factors on history.  Orthostatic hypotension could be another explanation but she did not endorse classic signs of orthostatic hypotension on  history.  Reports that vitals and glucose were all normal at home.  Primary Diagnosis:  TIA  Recommendations  Plan:  Recommend that primary team order following: - Frequent Neuro checks per stroke unit protocol - Recommend brain imaging with MRI Brain without contrast - Recommend Vascular imaging with MRA Angio Head without contrast and US Carotid doppler - Recommend obtaining TTE  - Recommend obtaining Lipid panel with LDL - Please start statin if LDL > 70 - Recommend HbA1c - Antithrombotic - Aspirin 81mg  daily along with plavix 75mg  daily x 21 days, followed by aspirin 81mg  daily alone. - Recommend DVT ppx - SBP goal - permissive hypertension first 24 h < 220/110. Held home meds.  - Recommend Telemetry monitoring for arrythmia - Recommend bedside swallow screen prior to PO intake. - Stroke education booklet - Recommend PT/OT/SLP consult  ______________________________________________________________________   Thank you for the opportunity to take part in the care of this patient. If you have any further  questions, please contact the neurology consultation attending.  Signed,  [place] Pager Number [phone] _ _ _   _ __   _ __ _ _  __ __   _ __   __ _

## 2021-01-30 NOTE — Evaluation (Signed)
Physical Therapy Evaluation Patient Details Name: Taylor Buchanan MRN: [id number] DOB: 1955/09/19 Today's Date: 01/30/2021   History of Present Illness  Taylor Buchanan is a 65 y.o. female  who presents with a brief episode of dysarthria and LUE floppiness that lasted less than 5 mins. Althou the duration of the episode is somewhat atypical,  suspicion is still that this was a TIA. She does have a remote R cerebellar stroke on imaging. with PMH significant for osteoporosis, scoliosis  Clinical Impression   Patient evaluated by Physical Therapy with no further acute PT needs identified.  VSS on Room Air; slight nausea during session; Educated pt in BE FAST and non-modifiable and modifiable risk factors for CVA; All education has been completed and the patient has no further questions.  See below for any follow-up Physical Therapy or equipment needs. PT is signing off. Thank you for this referral.     Follow Up Recommendations No PT follow up    Equipment Recommendations  None recommended by PT    Recommendations for Other Services       Precautions / Restrictions Precautions Precautions: None Restrictions Weight Bearing Restrictions: No      Mobility  Bed Mobility Overal bed mobility: Independent                  Transfers Overall transfer level: Independent                  Ambulation/Gait Ambulation/Gait assistance: Independent Gait Distance (Feet): 200 Feet Assistive device: None;IV Pole Gait Pattern/deviations: WFL(Within Functional Limits)     General Gait Details: No difficulties; able to have conversation while walking without difficulty or loss of balance  Stairs            Wheelchair Mobility    Modified Rankin (Stroke Patients Only)       Balance                             High level balance activites: Backward walking;Braiding;Side stepping High Level Balance Comments: No difficulty; one small bobble from which pt  recovered independently             Pertinent Vitals/Pain Pain Assessment: No/denies pain (Reports nausea -- attributes to general nervousness, no sleep in past 24 hours)    Home Living Family/patient expects to be discharged to:: Private residence Living Arrangements: Spouse/significant other (both retired) Available Help at Discharge: Family;Friend(s);Available 24 hours/day Type of Home: House Home Access: Stairs to enter   [employer] of Steps: 1 Home Layout: Two level;Able to live on main level with bedroom/bathroom (does not go upstairs much)        Prior Function Level of Independence: Independent         Comments: Enjoys camping; recently purchased a new bicycle; familiar with yoga (used ot teach)     Hand Dominance        Extremity/Trunk Assessment   Upper Extremity Assessment Upper Extremity Assessment:  (WFL for simple mobility tasks)    Lower Extremity Assessment Lower Extremity Assessment: Overall WFL for tasks assessed    Cervical / Trunk Assessment Cervical / Trunk Assessment: Normal  Communication   Communication: No difficulties  Cognition Arousal/Alertness: Awake/alert Behavior During Therapy: WFL for tasks assessed/performed Overall Cognitive Status: Within Functional Limits for tasks assessed  General Comments: Reports she is quite nervous, a bit afraid to sleep; Very engaged in conversation re: modifiable risk factors and BE FAST; became tearful talking about her fear of another stroke/stroke-like episode      General Comments General comments (skin integrity, edema, etc.): VSS on Room Air; slight nausea during session; Educated pt in BE FAST and non-modifiable and modifiable risk factors for CVA    Exercises     Assessment/Plan    PT Assessment Patent does not need any further PT services  PT Problem List         PT Treatment Interventions      PT Goals (Current goals  can be found in the Care Plan section)  Acute Rehab PT Goals Patient Stated Goal: Hopes to get rest soon; knows she will have more tests to help guide doctors to decr risk of another stroke-like episode or stroke PT Goal Formulation: All assessment and education complete, DC therapy    Frequency     Barriers to discharge        Co-evaluation               AM-PAC PT "6 Clicks" Mobility  Outcome Measure Help needed turning from your back to your side while in a flat bed without using bedrails?: None Help needed moving from lying on your back to sitting on the side of a flat bed without using bedrails?: None Help needed moving to and from a bed to a chair (including a wheelchair)?: None Help needed standing up from a chair using your arms (e.g., wheelchair or bedside chair)?: None Help needed to walk in hospital room?: None Help needed climbing 3-5 steps with a railing? : None 6 Click Score: 24    End of Session   Activity Tolerance: Patient tolerated treatment well Patient left: in bed;with call bell/phone within reach Nurse Communication: Mobility status PT Visit Diagnosis: Other abnormalities of gait and mobility (R26.89)    Time: 8768-1157 PT Time Calculation (min) (ACUTE ONLY): 31 min   Charges:   PT Evaluation $PT Eval Low Complexity: 1 Low PT Treatments $Gait Training: 8-22 mins        Roney Marion, PT  Acute Rehabilitation Services Pager [phone] Office [phone]   [place] 01/30/2021, 9:26 AM

## 2021-01-30 NOTE — Evaluation (Signed)
Speech Language Pathology Evaluation Patient Details Name: Taylor Buchanan MRN: [id number] DOB: [birth year] Today's Date: 01/30/2021 Time: 6010-9323 SLP Time Calculation (min) (ACUTE ONLY): 23 min  Problem List:  Patient Active Problem List   Diagnosis Date Noted   TIA (transient ischemic attack) 01/29/2021   Scoliosis 04/18/2019   Osteoporosis 09/02/2013   Past Medical History:  Past Medical History:  Diagnosis Date   Osteoporosis, unspecified 09/02/2013   Scoliosis 04/18/2019   Past Surgical History:  Past Surgical History:  Procedure Laterality Date   CHOLECYSTECTOMY     FOOT SURGERY     HPI:  Taylor Buchanan is a 65 y.o. female  who presents with a brief episode of dysarthria and LUE floppiness that lasted less than 5 mins. Althou the duration of the episode is somewhat atypical,  suspicion is still that this was a TIA. She does have a remote R cerebellar stroke on imaging. with PMH significant for osteoporosis, scoliosis.   Assessment / Plan / Recommendation Clinical Impression  Taylor Buchanan presents with suspected TIA. She had transient dysarthria and LUE weakness. She reports that it has completely resolved and denies any speech/swallow/cognitive changes. Her husband was present to confirm. She was agreeable to cognitive examination. She scored WNL on standardized testing of orientation, attention, memory, calculations, problem solving, reasoning, and word finding. She was fully intelligible without slurring or language deficits.  COGNISTAT: Orientation: 12/12 Attention: 8/8 Calculations: 3/4 (she and husband report baseline) Memory: 11/12 Reasoning: 8/8 Naming: 8/8 Following Directions: 12/12  Based on these findings, no further ST is indicated.    SLP Assessment  SLP Recommendation/Assessment: Patient does not need any further Speech Lanaguage Pathology Services    Follow Up Recommendations     N/a  Frequency and Duration     N/a      SLP Evaluation Cognition   Overall Cognitive Status: Within Functional Limits for tasks assessed Arousal/Alertness: Awake/alert Orientation Level: Oriented X4 Attention: Focused Focused Attention: Appears intact Memory: Appears intact Awareness: Appears intact Problem Solving: Appears intact Safety/Judgment: Appears intact       Comprehension  Auditory Comprehension Overall Auditory Comprehension: Appears within functional limits for tasks assessed    Expression Expression Primary Mode of Expression: Verbal Verbal Expression Overall Verbal Expression: Appears within functional limits for tasks assessed   Oral / Motor  Oral Motor/Sensory Function Overall Oral Motor/Sensory Function: Within functional limits Motor Speech Overall Motor Speech: Appears within functional limits for tasks assessed Articulation: Within functional limitis Intelligibility: Intelligible Motor Planning: Witnin functional limits   GO            Taylor Buchanan P. Jaasiel [place], M.S., [place] Pathologist Acute Rehabilitation Services Pager: [place] 01/30/2021, 11:59 AM

## 2021-01-30 NOTE — Progress Notes (Signed)
OT Cancellation Note and Discharge  Patient Details Name: [name] MRN: [id number] DOB: July 21, 1956   Cancelled Treatment:    Reason Eval/Treat Not Completed: OT screened, no needs identified, will sign off. PT contacted me and reported pt independent with her and no OT needs were noted, but may want to check to see if she has in questions/issues still with LUE. In to speak with pt and to inquire about LUE. Pt reports LUE is back to normal.  [place], OTR/L Acute Rehab Services Pager [phone] Office [phone]    [place] 01/30/2021, 10:45 AM

## 2021-01-31 ENCOUNTER — Observation Stay (HOSPITAL_BASED_OUTPATIENT_CLINIC_OR_DEPARTMENT_OTHER): Payer: Managed Care, Other (non HMO)

## 2021-01-31 DIAGNOSIS — G43909 Migraine, unspecified, not intractable, without status migrainosus: Secondary | ICD-10-CM | POA: Diagnosis not present

## 2021-01-31 DIAGNOSIS — G459 Transient cerebral ischemic attack, unspecified: Secondary | ICD-10-CM

## 2021-01-31 DIAGNOSIS — R531 Weakness: Secondary | ICD-10-CM | POA: Diagnosis not present

## 2021-01-31 LAB — ECHOCARDIOGRAM COMPLETE
AR max vel: 2.73 cm2
AV Area VTI: 2.51 cm2
AV Area mean vel: 2.71 cm2
AV Mean grad: 4 mmHg
AV Peak grad: 8.5 mmHg
Ao pk vel: 1.46 m/s
Area-P 1/2: 3.91 cm2
Height: 66.5 in
S' Lateral: 3.3 cm
Weight: 2680.79 oz

## 2021-01-31 LAB — T3, FREE: T3, Free: 2.2 pg/mL (ref 2.0–4.4)

## 2021-01-31 LAB — HEMOGLOBIN A1C
Hgb A1c MFr Bld: 5.4 % (ref 4.8–5.6)
Mean Plasma Glucose: 108 mg/dL

## 2021-01-31 MED ORDER — ATORVASTATIN CALCIUM 40 MG PO TABS: 40.0000 mg | ORAL_TABLET | Freq: Every day | ORAL | 1 refills | Status: DC

## 2021-01-31 MED ORDER — ASPIRIN 81 MG PO TBEC: 81.0000 mg | DELAYED_RELEASE_TABLET | Freq: Every day | ORAL | 11 refills | Status: AC

## 2021-01-31 MED ORDER — CLOPIDOGREL BISULFATE 75 MG PO TABS: 75.0000 mg | ORAL_TABLET | Freq: Every day | ORAL | 0 refills | Status: DC

## 2021-01-31 NOTE — Discharge Summary (Signed)
Taylor Buchanan VHQ:[id number] DOB: May 17, 1956 DOA: 01/29/2021  PCP: Leamon Arnt, MD  Admit date: 01/29/2021 Discharge date: 01/31/2021  Time spent: 35 minutes  Recommendations for Outpatient Follow-up:  Neurology f/u Thyroid function tests in 4-6 weeks     Discharge Diagnoses:  Principal Problem:   TIA (transient ischemic attack)   Discharge Condition: stable  Diet recommendation: regular  Filed Weights   01/29/21 1916 01/30/21 0200  Weight: 75 kg 76 kg    History of present illness:  Taylor Buchanan is a 65 y.o. female with prior history of tobacco abuse quit for 20 years now presents to the ER after patient had brief episode of left upper extremity weakness and dizziness and also a brief episode of aphasia all which lasted less than 2 minutes.  Happened around 6:30 PM while patient was having dinner.  Patient was brought to the ER by patient's husband.    Hospital Course:  # Transient ischemic attack Neuro exam back to baseline which is normal. MRI reveals remote cerebellar infarct, CTA neg. No events on tele. TTE unremarkable - home w/ statin, asa, and 3 wks plavix - outpt neuro f/u - ziopatch ordered, cardiology will contact patient to place  # Elevated tsh Mild, in 6s, free t4 mildly low - advise repeat TFTs in 4-6 wks  Procedures: TTE   Consultations: neurology  Discharge Exam: Vitals:   01/31/21 0410 01/31/21 0905  BP: 106/70 128/87  Pulse: 65 64  Resp: 20 12  Temp: 98.8 F (37.1 C) 98.7 F (37.1 C)  SpO2: 97% 98%    General exam: Appears calm and comfortable Respiratory system: Clear to auscultation. Respiratory effort normal. Cardiovascular system: S1 & S2 heard, RRR. No JVD, murmurs, rubs, gallops or clicks. No pedal edema. Gastrointestinal system: Abdomen is nondistended, soft and nontender. No organomegaly or masses felt. Normal bowel sounds heard. Central nervous system: Alert and oriented. No focal neurological deficits. Extremities:  Symmetric 5 x 5 power. Skin: No rashes, lesions or ulcers Psychiatry: Judgement and insight appear normal. Mood & affect appropriate.  Discharge Instructions   Discharge Instructions     Ambulatory referral to Neurology   Complete by: As directed    Follow up with stroke clinic NP (Jessica Vanschaick or Cecille Rubin, if both not available, consider Zachery Dauer, or [place]) at [facility] in about 4 weeks. Thanks.   Diet - low sodium heart healthy   Complete by: As directed    Increase activity slowly   Complete by: As directed       Allergies as of 01/31/2021   No Known Allergies      Medication List     STOP taking these medications    omeprazole 20 MG capsule Commonly known as: PRILOSEC       TAKE these medications    aspirin 81 MG EC tablet Take 1 tablet (81 mg total) by mouth daily. Swallow whole. Start taking on: February 01, 2021   atorvastatin 40 MG tablet Commonly known as: LIPITOR Take 1 tablet (40 mg total) by mouth daily. Start taking on: February 01, 2021   calcium carbonate 600 MG Tabs tablet Commonly known as: OS-CAL Take 600 mg by mouth 2 (two) times daily with a meal.   cholecalciferol 1000 units tablet Commonly known as: VITAMIN D Take 1,000 Units by mouth daily.   clopidogrel 75 MG tablet Commonly known as: PLAVIX Take 1 tablet (75 mg total) by mouth daily. Start taking on: February 01, 2021   [facility]  OIL PO Take by mouth.   fluticasone 50 MCG/ACT nasal spray Commonly known as: FLONASE Place 1 spray into both nostrils daily.   multivitamin capsule Take 1 capsule by mouth daily.   Prolia 60 MG/ML Sosy injection Generic drug: denosumab Inject 60 mg into the skin every 6 (six) months.       No Known Allergies  Follow-up Information     [facility]. Schedule an appointment as soon as possible for a visit in 1 month(s).   Specialty: Neurology Contact information: [address] [place] [phone]        [facility] Follow up.   Why: will be contacting you about heat monitor. their phone number is [phone]                 The results of significant diagnostics from this hospitalization (including imaging, microbiology, ancillary and laboratory) are listed below for reference.    Significant Diagnostic Studies: CT Angio Head W or Wo Contrast  Result Date: 01/29/2021 CLINICAL DATA:  Initial evaluation for neuro deficit, stroke suspected. EXAM: CT ANGIOGRAPHY HEAD AND NECK TECHNIQUE: Multidetector CT imaging of the head and neck was performed using the standard protocol during bolus administration of intravenous contrast. Multiplanar CT image reconstructions and MIPs were obtained to evaluate the vascular anatomy. Carotid stenosis measurements (when applicable) are obtained utilizing NASCET criteria, using the distal internal carotid diameter as the denominator. CONTRAST:  11mL OMNIPAQUE IOHEXOL 350 MG/ML SOLN COMPARISON:  None. FINDINGS: CT HEAD FINDINGS Brain: Cerebral volume within normal limits for patient age. Small remote right cerebellar infarct noted. No evidence for acute intracranial hemorrhage. No findings to suggest acute large vessel territory infarct. No mass lesion, midline shift, or mass effect. Ventricles are normal in size without evidence for hydrocephalus. No extra-axial fluid collection identified. Vascular: No hyperdense vessel identified. Skull: Scalp soft tissues demonstrate no acute abnormality. Calvarium intact. Sinuses/Orbits: Globes and orbital soft tissues within normal limits. Visualized paranasal sinuses are clear. No mastoid effusion. CTA NECK FINDINGS Aortic arch: Arch visualized aortic arch of normal caliber with normal 3 vessel morphology. No hemodynamically significant stenosis about the origin of the great vessels. Right carotid system: Right common and internal carotid arteries widely patent without stenosis, dissection or  occlusion. Left carotid system: Left common and internal carotid arteries widely patent without stenosis, dissection or occlusion. Vertebral arteries: Both vertebral arteries arise from the subclavian arteries. No proximal subclavian artery stenosis. Both vertebral arteries widely patent without stenosis, dissection or occlusion. Skeleton: No visible acute osseous finding. No discrete or worrisome osseous lesions. Cervicothoracic scoliosis noted. Other neck: No other acute soft tissue abnormality within the neck. No mass or adenopathy. Upper chest: Visualized upper chest demonstrates no acute finding. Review of the MIP images confirms the above findings CTA HEAD FINDINGS Anterior circulation: Both internal carotid arteries widely patent to the termini without stenosis. A1 segments widely patent. Normal anterior communicating artery complex. Both anterior cerebral arteries widely patent to their distal aspects without stenosis. No M1 stenosis or occlusion. Normal MCA bifurcations. Distal MCA branches well perfused and symmetric. Posterior circulation: Both V4 segments patent to the vertebrobasilar junction without stenosis. Both PICA origins patent and normal. Basilar widely patent to its distal aspect without stenosis. Superior cerebellar arteries patent bilaterally. Left PCA primarily supplied via the basilar. Predominant fetal type origin of the right PCA. Both PCAs well perfused to their distal aspects without stenosis. Venous sinuses: Patent. Anatomic variants: Fetal type  origin of the right PCA.  No aneurysm. Review of the MIP images confirms the above findings IMPRESSION: 1. Negative CTA of the head and neck. No large vessel occlusion, hemodynamically significant stenosis, or other acute vascular abnormality. 2. Small remote right cerebellar infarct. No other acute intracranial abnormality. Electronically Signed   By: Jeannine Boga M.D.   On: 01/29/2021 22:32   CT Angio Neck W and/or Wo  Contrast  Result Date: 01/29/2021 CLINICAL DATA:  Initial evaluation for neuro deficit, stroke suspected. EXAM: CT ANGIOGRAPHY HEAD AND NECK TECHNIQUE: Multidetector CT imaging of the head and neck was performed using the standard protocol during bolus administration of intravenous contrast. Multiplanar CT image reconstructions and MIPs were obtained to evaluate the vascular anatomy. Carotid stenosis measurements (when applicable) are obtained utilizing NASCET criteria, using the distal internal carotid diameter as the denominator. CONTRAST:  12mL OMNIPAQUE IOHEXOL 350 MG/ML SOLN COMPARISON:  None. FINDINGS: CT HEAD FINDINGS Brain: Cerebral volume within normal limits for patient age. Small remote right cerebellar infarct noted. No evidence for acute intracranial hemorrhage. No findings to suggest acute large vessel territory infarct. No mass lesion, midline shift, or mass effect. Ventricles are normal in size without evidence for hydrocephalus. No extra-axial fluid collection identified. Vascular: No hyperdense vessel identified. Skull: Scalp soft tissues demonstrate no acute abnormality. Calvarium intact. Sinuses/Orbits: Globes and orbital soft tissues within normal limits. Visualized paranasal sinuses are clear. No mastoid effusion. CTA NECK FINDINGS Aortic arch: Arch visualized aortic arch of normal caliber with normal 3 vessel morphology. No hemodynamically significant stenosis about the origin of the great vessels. Right carotid system: Right common and internal carotid arteries widely patent without stenosis, dissection or occlusion. Left carotid system: Left common and internal carotid arteries widely patent without stenosis, dissection or occlusion. Vertebral arteries: Both vertebral arteries arise from the subclavian arteries. No proximal subclavian artery stenosis. Both vertebral arteries widely patent without stenosis, dissection or occlusion. Skeleton: No visible acute osseous finding. No discrete or  worrisome osseous lesions. Cervicothoracic scoliosis noted. Other neck: No other acute soft tissue abnormality within the neck. No mass or adenopathy. Upper chest: Visualized upper chest demonstrates no acute finding. Review of the MIP images confirms the above findings CTA HEAD FINDINGS Anterior circulation: Both internal carotid arteries widely patent to the termini without stenosis. A1 segments widely patent. Normal anterior communicating artery complex. Both anterior cerebral arteries widely patent to their distal aspects without stenosis. No M1 stenosis or occlusion. Normal MCA bifurcations. Distal MCA branches well perfused and symmetric. Posterior circulation: Both V4 segments patent to the vertebrobasilar junction without stenosis. Both PICA origins patent and normal. Basilar widely patent to its distal aspect without stenosis. Superior cerebellar arteries patent bilaterally. Left PCA primarily supplied via the basilar. Predominant fetal type origin of the right PCA. Both PCAs well perfused to their distal aspects without stenosis. Venous sinuses: Patent. Anatomic variants: Fetal type origin of the right PCA.  No aneurysm. Review of the MIP images confirms the above findings IMPRESSION: 1. Negative CTA of the head and neck. No large vessel occlusion, hemodynamically significant stenosis, or other acute vascular abnormality. 2. Small remote right cerebellar infarct. No other acute intracranial abnormality. Electronically Signed   By: Jeannine Boga M.D.   On: 01/29/2021 22:32   MR BRAIN WO CONTRAST  Result Date: 01/30/2021 CLINICAL DATA:  Transient dysarthria and left arm numbness/weakness, now resolved. TIA. EXAM: MRI HEAD WITHOUT CONTRAST TECHNIQUE: Multiplanar, multiecho pulse sequences of the brain and surrounding structures were obtained without intravenous  contrast. COMPARISON:  CT and CTA from yesterday FINDINGS: Brain: No acute infarction, hemorrhage, hydrocephalus, extra-axial collection or  mass lesion. Small remote right cerebellar infarct. Age congruent brain volume and white matter appearance. Vascular: Normal flow voids.  There was preceding CTA. Skull and upper cervical spine: Normal marrow signal. Sinuses/Orbits: Negative. IMPRESSION: 1. No acute finding. 2. Small remote right cerebellar infarction. Electronically Signed   By: Monte Fantasia M.D.   On: 01/30/2021 04:54   DG Chest Portable 1 View  Result Date: 01/30/2021 CLINICAL DATA:  Hypoxia EXAM: PORTABLE CHEST 1 VIEW COMPARISON:  None. FINDINGS: Thoracolumbar scoliosis. Hyperinflation of the lungs. Heart is normal size. No confluent opacities or effusions. No acute bony abnormality. IMPRESSION: Hyperinflation.  No active disease. Electronically Signed   By: Rolm Baptise M.D.   On: 01/30/2021 01:29   ECHOCARDIOGRAM COMPLETE  Result Date: 01/31/2021    ECHOCARDIOGRAM REPORT   Patient Name:   CHANY Buchanan Date of Exam: 01/31/2021 Medical Rec #:  [id number]    Height:       66.5 in Accession #:    [id number]   Weight:       167.5 lb Date of Birth:  [birth year]    BSA:          1.865 m Patient Age:    27 years     BP:           128/87 mmHg Patient Gender: F            HR:           65 bpm. Exam Location:  Inpatient Procedure: 2D Echo, Cardiac Doppler and Color Doppler Indications:    TIA G45.9  History:        Patient has no prior history of Echocardiogram examinations.  Sonographer:    Merrie Roof RDCS Referring Phys: [place]  1. Left ventricular ejection fraction, by estimation, is 60 to 65%. The left ventricle has normal function. The left ventricle has no regional wall motion abnormalities. There is mild left ventricular hypertrophy. Left ventricular diastolic parameters were normal.  2. Right ventricular systolic function is normal. The right ventricular size is normal. There is normal pulmonary artery systolic pressure. The estimated right ventricular systolic pressure is 63.3 mmHg.  3. The mitral valve is  normal in structure. Mild mitral valve regurgitation.  4. The aortic valve is tricuspid. Aortic valve regurgitation is not visualized. No aortic stenosis is present.  5. The inferior vena cava is normal in size with greater than 50% respiratory variability, suggesting right atrial pressure of 3 mmHg. FINDINGS  Left Ventricle: Left ventricular ejection fraction, by estimation, is 60 to 65%. The left ventricle has normal function. The left ventricle has no regional wall motion abnormalities. The left ventricular internal cavity size was normal in size. There is  mild left ventricular hypertrophy. Left ventricular diastolic parameters were normal. Right Ventricle: The right ventricular size is normal. No increase in right ventricular wall thickness. Right ventricular systolic function is normal. There is normal pulmonary artery systolic pressure. The tricuspid regurgitant velocity is 2.14 m/s, and  with an assumed right atrial pressure of 3 mmHg, the estimated right ventricular systolic pressure is 35.4 mmHg. Left Atrium: Left atrial size was normal in size. Right Atrium: Right atrial size was normal in size. Pericardium: There is no evidence of pericardial effusion. Mitral Valve: The mitral valve is normal in structure. Mild mitral valve regurgitation. Tricuspid Valve: The tricuspid valve is normal in  structure. Tricuspid valve regurgitation is trivial. Aortic Valve: The aortic valve is tricuspid. Aortic valve regurgitation is not visualized. No aortic stenosis is present. Aortic valve mean gradient measures 4.0 mmHg. Aortic valve peak gradient measures 8.5 mmHg. Aortic valve area, by VTI measures 2.51 cm. Pulmonic Valve: The pulmonic valve was not well visualized. Pulmonic valve regurgitation is not visualized. Aorta: The aortic root and ascending aorta are structurally normal, with no evidence of dilitation. Venous: The inferior vena cava is normal in size with greater than 50% respiratory variability, suggesting  right atrial pressure of 3 mmHg. IAS/Shunts: The interatrial septum was not well visualized.  LEFT VENTRICLE PLAX 2D LVIDd:         4.60 cm  Diastology LVIDs:         3.30 cm  LV e' medial:    9.25 cm/s LV PW:         1.00 cm  LV E/e' medial:  8.5 LV IVS:        0.90 cm  LV e' lateral:   11.20 cm/s LVOT diam:     2.00 cm  LV E/e' lateral: 7.0 LV SV:         77 LV SV Index:   41 LVOT Area:     3.14 cm  RIGHT VENTRICLE RV Basal diam:  4.00 cm LEFT ATRIUM             Index       RIGHT ATRIUM           Index LA diam:        3.00 cm 1.61 cm/m  RA Area:     15.60 cm LA Vol (A2C):   50.4 ml 27.02 ml/m RA Volume:   42.30 ml  22.68 ml/m LA Vol (A4C):   51.3 ml 27.50 ml/m LA Biplane Vol: 51.3 ml 27.50 ml/m  AORTIC VALVE AV Area (Vmax):    2.73 cm AV Area (Vmean):   2.71 cm AV Area (VTI):     2.51 cm AV Vmax:           146.00 cm/s AV Vmean:          96.100 cm/s AV VTI:            0.305 m AV Peak Grad:      8.5 mmHg AV Mean Grad:      4.0 mmHg LVOT Vmax:         127.00 cm/s LVOT Vmean:        82.800 cm/s LVOT VTI:          0.244 m LVOT/AV VTI ratio: 0.80  AORTA Ao Root diam: 3.20 cm Ao Asc diam:  2.80 cm MITRAL VALVE               TRICUSPID VALVE MV Area (PHT): 3.91 cm    TR Peak grad:   18.3 mmHg MV Decel Time: 194 msec    TR Vmax:        214.00 cm/s MV E velocity: 78.80 cm/s MV A velocity: 76.70 cm/s  SHUNTS MV E/A ratio:  1.03        Systemic VTI:  0.24 m                            Systemic Diam: 2.00 cm Oswaldo Milian MD Electronically signed by Oswaldo Milian MD Signature Date/Time: 01/31/2021/1:25:09 PM    Final     Microbiology: Recent Results (from the past  240 hour(s))  Resp Panel by RT-PCR (Flu A&B, Covid) Nasopharyngeal Swab     Status: None   Collection Time: 01/29/21 11:22 PM   Specimen: Nasopharyngeal Swab; Nasopharyngeal(NP) swabs in vial transport medium  Result Value Ref Range Status   SARS Coronavirus 2 by RT PCR NEGATIVE NEGATIVE Final    Comment: (NOTE) SARS-CoV-2 target  nucleic acids are NOT DETECTED.  The SARS-CoV-2 RNA is generally detectable in upper respiratory specimens during the acute phase of infection. The lowest concentration of SARS-CoV-2 viral copies this assay can detect is 138 copies/mL. A negative result does not preclude SARS-Cov-2 infection and should not be used as the sole basis for treatment or other patient management decisions. A negative result may occur with  improper specimen collection/handling, submission of specimen other than nasopharyngeal swab, presence of viral mutation(s) within the areas targeted by this assay, and inadequate number of viral copies(<138 copies/mL). A negative result must be combined with clinical observations, patient history, and epidemiological information. The expected result is Negative.  Fact Sheet for Patients:  EntrepreneurPulse.com.au  Fact Sheet for Healthcare Providers:  IncredibleEmployment.be  This test is no t yet approved or cleared by the [place] FDA and  has been authorized for detection and/or diagnosis of SARS-CoV-2 by FDA under an Emergency Use Authorization (EUA). This EUA will remain  in effect (meaning this test can be used) for the duration of the COVID-19 declaration under Section 564(b)(1) of the Act, 21 U.S.C.section 360bbb-3(b)(1), unless the authorization is terminated  or revoked sooner.       Influenza A by PCR NEGATIVE NEGATIVE Final   Influenza B by PCR NEGATIVE NEGATIVE Final    Comment: (NOTE) The Xpert Xpress SARS-CoV-2/FLU/RSV plus assay is intended as an aid in the diagnosis of influenza from Nasopharyngeal swab specimens and should not be used as a sole basis for treatment. Nasal washings and aspirates are unacceptable for Xpert Xpress SARS-CoV-2/FLU/RSV testing.  Fact Sheet for Patients: EntrepreneurPulse.com.au  Fact Sheet for Healthcare  Providers: IncredibleEmployment.be  This test is not yet approved or cleared by the [place] FDA and has been authorized for detection and/or diagnosis of SARS-CoV-2 by FDA under an Emergency Use Authorization (EUA). This EUA will remain in effect (meaning this test can be used) for the duration of the COVID-19 declaration under Section 564(b)(1) of the Act, 21 U.S.C. section 360bbb-3(b)(1), unless the authorization is terminated or revoked.  Performed at [facility], [address] [place], [place] [address]      Labs: Basic Metabolic Panel: Recent Labs  Lab 01/29/21 2023 01/30/21 0603  NA 137  --   K 3.6  --   CL 102  --   CO2 23  --   GLUCOSE 91  --   BUN 17  --   CREATININE 0.78 0.77  CALCIUM 9.6  --    Liver Function Tests: Recent Labs  Lab 01/29/21 2023  AST 23  ALT 12  ALKPHOS 72  BILITOT 0.4  PROT 7.3  ALBUMIN 4.4   No results for input(s): LIPASE, AMYLASE in the last 168 hours. No results for input(s): AMMONIA in the last 168 hours. CBC: Recent Labs  Lab 01/29/21 2023 01/30/21 0603  WBC 7.7 9.0  NEUTROABS 4.7  --   HGB 13.8 13.5  HCT 40.7 39.5  MCV 96.0 95.2  PLT 311 334   Cardiac Enzymes: No results for input(s): CKTOTAL, CKMB, CKMBINDEX, TROPONINI in the last 168 hours. BNP: BNP (last 3 results) No results  for input(s): BNP in the last 8760 hours.  ProBNP (last 3 results) No results for input(s): PROBNP in the last 8760 hours.  CBG: Recent Labs  Lab 01/29/21 2026  GLUCAP 96       Signed:  Desma Maxim MD.  [facility] 01/31/2021, 2:02 PM

## 2021-01-31 NOTE — Progress Notes (Signed)
PROGRESS NOTE    Taylor Buchanan  QMV:[id number] DOB: [birth year] DOA: 01/29/2021 PCP: Leamon Arnt, MD      Brief Narrative:   Taylor Buchanan is a 65 y.o. female with prior history of tobacco abuse quit for 20 years now presents to the ER after patient had brief episode of left upper extremity weakness and dizziness and also a brief episode of aphasia all which lasted less than 2 minutes.  Happened around 6:30 PM while patient was having dinner.  Patient was brought to the ER by patient's husband.   Assessment & Plan:   Principal Problem:   TIA (transient ischemic attack)   # Transient ischemic attack Neuro exam back to baseline which is normal. MRI reveals remote cerebellar infarct, CTA neg. No events on tele.  - neuro following, will appreciate today's recs when available - TTE pending - poss outpt ziopatch - cont asa/plavix, plan to transition to asa monotherapy in 3 weeks - cont statin  # Elevated TSH - t3/t4 pending   DVT prophylaxis: lovenox Code Status: full Family Communication: none @ bedside  Level of care: Telemetry Medical Status is: Observation  The patient remains OBS appropriate and will d/c before 2 midnights.  Dispo: The patient is from: Home              Anticipated d/c is to: Home              Patient currently is not medically stable to d/c.   Difficult to place patient No        Consultants:  neurology  Procedures: none  Antimicrobials:  none    Subjective: This morning feeling well. No speaking problems, no weakness/numbness.  Objective: Vitals:   01/30/21 1941 01/30/21 2321 01/31/21 0200 01/31/21 0410  BP: 121/67 (!) 103/56  106/70  Pulse: 64 72  65  Resp: 16 13 20 20   Temp: 97.6 F (36.4 C) 98.3 F (36.8 C)  98.8 F (37.1 C)  TempSrc: Oral Oral  Oral  SpO2: 99% 96%  97%  Weight:      Height:        Intake/Output Summary (Last 24 hours) at 01/31/2021 0743 Last data filed at 01/30/2021 1505 Gross per 24 hour  Intake  484.77 ml  Output --  Net 484.77 ml   Filed Weights   01/29/21 1916 01/30/21 0200  Weight: 75 kg 76 kg    Examination:  General exam: Appears calm and comfortable  Respiratory system: Clear to auscultation. Respiratory effort normal. Cardiovascular system: S1 & S2 heard, RRR. No JVD, murmurs, rubs, gallops or clicks. No pedal edema. Gastrointestinal system: Abdomen is nondistended, soft and nontender. No organomegaly or masses felt. Normal bowel sounds heard. Central nervous system: Alert and oriented. No focal neurological deficits. Extremities: Symmetric 5 x 5 power.  Skin: No rashes, lesions or ulcers Psychiatry: Judgement and insight appear normal. Mood & affect appropriate.     Data Reviewed: I have personally reviewed following labs and imaging studies  CBC: Recent Labs  Lab 01/29/21 2023 01/30/21 0603  WBC 7.7 9.0  NEUTROABS 4.7  --   HGB 13.8 13.5  HCT 40.7 39.5  MCV 96.0 95.2  PLT 311 284   Basic Metabolic Panel: Recent Labs  Lab 01/29/21 2023 01/30/21 0603  NA 137  --   K 3.6  --   CL 102  --   CO2 23  --   GLUCOSE 91  --   BUN 17  --  CREATININE 0.78 0.77  CALCIUM 9.6  --    GFR: Estimated Creatinine Clearance: 74.8 mL/min (by C-G formula based on SCr of 0.77 mg/dL). Liver Function Tests: Recent Labs  Lab 01/29/21 2023  AST 23  ALT 12  ALKPHOS 72  BILITOT 0.4  PROT 7.3  ALBUMIN 4.4   No results for input(s): LIPASE, AMYLASE in the last 168 hours. No results for input(s): AMMONIA in the last 168 hours. Coagulation Profile: Recent Labs  Lab 01/29/21 2023  INR 0.9   Cardiac Enzymes: No results for input(s): CKTOTAL, CKMB, CKMBINDEX, TROPONINI in the last 168 hours. BNP (last 3 results) No results for input(s): PROBNP in the last 8760 hours. HbA1C: No results for input(s): HGBA1C in the last 72 hours. CBG: Recent Labs  Lab 01/29/21 2026  GLUCAP 96   Lipid Profile: Recent Labs    01/30/21 0603  CHOL 187  HDL 51  LDLCALC  123*  TRIG 67  CHOLHDL 3.7   Thyroid Function Tests: Recent Labs    01/29/21 2023 01/30/21 1307  TSH 6.574*  --   FREET4  --  0.56*   Anemia Panel: No results for input(s): VITAMINB12, FOLATE, FERRITIN, TIBC, IRON, RETICCTPCT in the last 72 hours. Urine analysis: No results found for: COLORURINE, APPEARANCEUR, LABSPEC, PHURINE, GLUCOSEU, HGBUR, BILIRUBINUR, KETONESUR, PROTEINUR, UROBILINOGEN, NITRITE, LEUKOCYTESUR Sepsis Labs: @LABRCNTIP (procalcitonin:4,lacticidven:4)  ) Recent Results (from the past 240 hour(s))  Resp Panel by RT-PCR (Flu A&B, Covid) Nasopharyngeal Swab     Status: None   Collection Time: 01/29/21 11:22 PM   Specimen: Nasopharyngeal Swab; Nasopharyngeal(NP) swabs in vial transport medium  Result Value Ref Range Status   SARS Coronavirus 2 by RT PCR NEGATIVE NEGATIVE Final    Comment: (NOTE) SARS-CoV-2 target nucleic acids are NOT DETECTED.  The SARS-CoV-2 RNA is generally detectable in upper respiratory specimens during the acute phase of infection. The lowest concentration of SARS-CoV-2 viral copies this assay can detect is 138 copies/mL. A negative result does not preclude SARS-Cov-2 infection and should not be used as the sole basis for treatment or other patient management decisions. A negative result may occur with  improper specimen collection/handling, submission of specimen other than nasopharyngeal swab, presence of viral mutation(s) within the areas targeted by this assay, and inadequate number of viral copies(<138 copies/mL). A negative result must be combined with clinical observations, patient history, and epidemiological information. The expected result is Negative.  Fact Sheet for Patients:  EntrepreneurPulse.com.au  Fact Sheet for Healthcare Providers:  IncredibleEmployment.be  This test is no t yet approved or cleared by the [place] FDA and  has been authorized for detection and/or diagnosis  of SARS-CoV-2 by FDA under an Emergency Use Authorization (EUA). This EUA will remain  in effect (meaning this test can be used) for the duration of the COVID-19 declaration under Section 564(b)(1) of the Act, 21 U.S.C.section 360bbb-3(b)(1), unless the authorization is terminated  or revoked sooner.       Influenza A by PCR NEGATIVE NEGATIVE Final   Influenza B by PCR NEGATIVE NEGATIVE Final    Comment: (NOTE) The Xpert Xpress SARS-CoV-2/FLU/RSV plus assay is intended as an aid in the diagnosis of influenza from Nasopharyngeal swab specimens and should not be used as a sole basis for treatment. Nasal washings and aspirates are unacceptable for Xpert Xpress SARS-CoV-2/FLU/RSV testing.  Fact Sheet for Patients: EntrepreneurPulse.com.au  Fact Sheet for Healthcare Providers: IncredibleEmployment.be  This test is not yet approved or cleared by the [place] FDA and has been  authorized for detection and/or diagnosis of SARS-CoV-2 by FDA under an Emergency Use Authorization (EUA). This EUA will remain in effect (meaning this test can be used) for the duration of the COVID-19 declaration under Section 564(b)(1) of the Act, 21 U.S.C. section 360bbb-3(b)(1), unless the authorization is terminated or revoked.  Performed at [facility], [address] [place], [place] [address]          Radiology Studies: CT Angio Head W or Wo Contrast  Result Date: 01/29/2021 CLINICAL DATA:  Initial evaluation for neuro deficit, stroke suspected. EXAM: CT ANGIOGRAPHY HEAD AND NECK TECHNIQUE: Multidetector CT imaging of the head and neck was performed using the standard protocol during bolus administration of intravenous contrast. Multiplanar CT image reconstructions and MIPs were obtained to evaluate the vascular anatomy. Carotid stenosis measurements (when applicable) are obtained utilizing NASCET criteria, using the distal internal  carotid diameter as the denominator. CONTRAST:  64mL OMNIPAQUE IOHEXOL 350 MG/ML SOLN COMPARISON:  None. FINDINGS: CT HEAD FINDINGS Brain: Cerebral volume within normal limits for patient age. Small remote right cerebellar infarct noted. No evidence for acute intracranial hemorrhage. No findings to suggest acute large vessel territory infarct. No mass lesion, midline shift, or mass effect. Ventricles are normal in size without evidence for hydrocephalus. No extra-axial fluid collection identified. Vascular: No hyperdense vessel identified. Skull: Scalp soft tissues demonstrate no acute abnormality. Calvarium intact. Sinuses/Orbits: Globes and orbital soft tissues within normal limits. Visualized paranasal sinuses are clear. No mastoid effusion. CTA NECK FINDINGS Aortic arch: Arch visualized aortic arch of normal caliber with normal 3 vessel morphology. No hemodynamically significant stenosis about the origin of the great vessels. Right carotid system: Right common and internal carotid arteries widely patent without stenosis, dissection or occlusion. Left carotid system: Left common and internal carotid arteries widely patent without stenosis, dissection or occlusion. Vertebral arteries: Both vertebral arteries arise from the subclavian arteries. No proximal subclavian artery stenosis. Both vertebral arteries widely patent without stenosis, dissection or occlusion. Skeleton: No visible acute osseous finding. No discrete or worrisome osseous lesions. Cervicothoracic scoliosis noted. Other neck: No other acute soft tissue abnormality within the neck. No mass or adenopathy. Upper chest: Visualized upper chest demonstrates no acute finding. Review of the MIP images confirms the above findings CTA HEAD FINDINGS Anterior circulation: Both internal carotid arteries widely patent to the termini without stenosis. A1 segments widely patent. Normal anterior communicating artery complex. Both anterior cerebral arteries widely  patent to their distal aspects without stenosis. No M1 stenosis or occlusion. Normal MCA bifurcations. Distal MCA branches well perfused and symmetric. Posterior circulation: Both V4 segments patent to the vertebrobasilar junction without stenosis. Both PICA origins patent and normal. Basilar widely patent to its distal aspect without stenosis. Superior cerebellar arteries patent bilaterally. Left PCA primarily supplied via the basilar. Predominant fetal type origin of the right PCA. Both PCAs well perfused to their distal aspects without stenosis. Venous sinuses: Patent. Anatomic variants: Fetal type origin of the right PCA.  No aneurysm. Review of the MIP images confirms the above findings IMPRESSION: 1. Negative CTA of the head and neck. No large vessel occlusion, hemodynamically significant stenosis, or other acute vascular abnormality. 2. Small remote right cerebellar infarct. No other acute intracranial abnormality. Electronically Signed   By: Jeannine Boga M.D.   On: 01/29/2021 22:32   CT Angio Neck W and/or Wo Contrast  Result Date: 01/29/2021 CLINICAL DATA:  Initial evaluation for neuro deficit, stroke suspected. EXAM: CT ANGIOGRAPHY HEAD AND NECK TECHNIQUE: Multidetector CT  imaging of the head and neck was performed using the standard protocol during bolus administration of intravenous contrast. Multiplanar CT image reconstructions and MIPs were obtained to evaluate the vascular anatomy. Carotid stenosis measurements (when applicable) are obtained utilizing NASCET criteria, using the distal internal carotid diameter as the denominator. CONTRAST:  7mL OMNIPAQUE IOHEXOL 350 MG/ML SOLN COMPARISON:  None. FINDINGS: CT HEAD FINDINGS Brain: Cerebral volume within normal limits for patient age. Small remote right cerebellar infarct noted. No evidence for acute intracranial hemorrhage. No findings to suggest acute large vessel territory infarct. No mass lesion, midline shift, or mass effect. Ventricles  are normal in size without evidence for hydrocephalus. No extra-axial fluid collection identified. Vascular: No hyperdense vessel identified. Skull: Scalp soft tissues demonstrate no acute abnormality. Calvarium intact. Sinuses/Orbits: Globes and orbital soft tissues within normal limits. Visualized paranasal sinuses are clear. No mastoid effusion. CTA NECK FINDINGS Aortic arch: Arch visualized aortic arch of normal caliber with normal 3 vessel morphology. No hemodynamically significant stenosis about the origin of the great vessels. Right carotid system: Right common and internal carotid arteries widely patent without stenosis, dissection or occlusion. Left carotid system: Left common and internal carotid arteries widely patent without stenosis, dissection or occlusion. Vertebral arteries: Both vertebral arteries arise from the subclavian arteries. No proximal subclavian artery stenosis. Both vertebral arteries widely patent without stenosis, dissection or occlusion. Skeleton: No visible acute osseous finding. No discrete or worrisome osseous lesions. Cervicothoracic scoliosis noted. Other neck: No other acute soft tissue abnormality within the neck. No mass or adenopathy. Upper chest: Visualized upper chest demonstrates no acute finding. Review of the MIP images confirms the above findings CTA HEAD FINDINGS Anterior circulation: Both internal carotid arteries widely patent to the termini without stenosis. A1 segments widely patent. Normal anterior communicating artery complex. Both anterior cerebral arteries widely patent to their distal aspects without stenosis. No M1 stenosis or occlusion. Normal MCA bifurcations. Distal MCA branches well perfused and symmetric. Posterior circulation: Both V4 segments patent to the vertebrobasilar junction without stenosis. Both PICA origins patent and normal. Basilar widely patent to its distal aspect without stenosis. Superior cerebellar arteries patent bilaterally. Left PCA  primarily supplied via the basilar. Predominant fetal type origin of the right PCA. Both PCAs well perfused to their distal aspects without stenosis. Venous sinuses: Patent. Anatomic variants: Fetal type origin of the right PCA.  No aneurysm. Review of the MIP images confirms the above findings IMPRESSION: 1. Negative CTA of the head and neck. No large vessel occlusion, hemodynamically significant stenosis, or other acute vascular abnormality. 2. Small remote right cerebellar infarct. No other acute intracranial abnormality. Electronically Signed   By: Jeannine Boga M.D.   On: 01/29/2021 22:32   MR BRAIN WO CONTRAST  Result Date: 01/30/2021 CLINICAL DATA:  Transient dysarthria and left arm numbness/weakness, now resolved. TIA. EXAM: MRI HEAD WITHOUT CONTRAST TECHNIQUE: Multiplanar, multiecho pulse sequences of the brain and surrounding structures were obtained without intravenous contrast. COMPARISON:  CT and CTA from yesterday FINDINGS: Brain: No acute infarction, hemorrhage, hydrocephalus, extra-axial collection or mass lesion. Small remote right cerebellar infarct. Age congruent brain volume and white matter appearance. Vascular: Normal flow voids.  There was preceding CTA. Skull and upper cervical spine: Normal marrow signal. Sinuses/Orbits: Negative. IMPRESSION: 1. No acute finding. 2. Small remote right cerebellar infarction. Electronically Signed   By: Monte Fantasia M.D.   On: 01/30/2021 04:54   DG Chest Portable 1 View  Result Date: 01/30/2021 CLINICAL DATA:  Hypoxia EXAM: PORTABLE CHEST  1 VIEW COMPARISON:  None. FINDINGS: Thoracolumbar scoliosis. Hyperinflation of the lungs. Heart is normal size. No confluent opacities or effusions. No acute bony abnormality. IMPRESSION: Hyperinflation.  No active disease. Electronically Signed   By: Rolm Baptise M.D.   On: 01/30/2021 01:29        Scheduled Meds:  aspirin EC  81 mg Oral Daily   atorvastatin  40 mg Oral Daily   clopidogrel  75 mg  Oral Daily   enoxaparin (LOVENOX) injection  40 mg Subcutaneous Q24H   Continuous Infusions:   LOS: 0 days    Time spent: 30 min    Desma Maxim, MD [facility]   If 7PM-7AM, please contact night-coverage [email or web address] Password [facility] 01/31/2021, 7:43 AM

## 2021-01-31 NOTE — Discharge Instructions (Signed)
Please stop taking omeprazole while you take clopidogrel (plavix) Follow-up with your PCP. One thing your PCP will want to check is your thyroid function in 4-6 weeks Follow-up with neurology

## 2021-01-31 NOTE — Progress Notes (Signed)
  Echocardiogram 2D Echocardiogram has been performed.  Taylor Buchanan F 01/31/2021, 10:57 AM

## 2021-01-31 NOTE — Progress Notes (Signed)
Stroke Neurology Progress Note  INTERVAL HISTORY   Husband at the bedside. They recounted HPI with me. Pt stated that she had numbness of left arm and hand, dropped napkin on the left hand, slurry speech for 30 sec and resolved. At that time she felt lightheadedness and nausea. She denies any heart palpitation. She has hx of migraine, average once a month, last one hour to a day, she uses Excedrin.     MEDICATIONS      Current Facility-Administered Medications (Cardiovascular):    atorvastatin (LIPITOR) tablet 40 mg     Current Facility-Administered Medications (Analgesics):    acetaminophen (TYLENOL) tablet 650 mg **OR** acetaminophen (TYLENOL) 160 MG/5ML solution 650 mg **OR** acetaminophen (TYLENOL) suppository 650 mg   aspirin EC tablet 81 mg   Current Facility-Administered Medications (Hematological):    clopidogrel (PLAVIX) tablet 75 mg   enoxaparin (LOVENOX) injection 40 mg    No current outpatient medications on file.  VITAL SIGNS  Temp:  [97.6 F (36.4 C)-99.8 F (37.7 C)] 98.7 F (37.1 C) (06/13 0905) Pulse Rate:  [64-72] 64 (06/13 0905) Resp:  [12-20] 12 (06/13 0905) BP: (103-131)/(56-87) 128/87 (06/13 0905) SpO2:  [96 %-99 %] 98 % (06/13 0905)  PHYSICAL EXAM   General Physical Exam  General: NAD, lying comfortably in bed HENT: Normal oropharynx and mucosa. Normal external appearance of ears and nose. CV/Chest: No JVD, normal S1S2 Lungs: No audible wheezing. Normal work of breathing. No accessory muscle use Abdomen: Non distended, non tender Extremities: Warm and well perfused. No appreciable edema, cyanosis or deformity. Skin: No rash. Normal palpation of skin.   Musculoskeletal: No joint tenderness. Normal digits and nails by inspection. No clubbing.  Neurologic Examination  Mental status/Cognition: alert; oriented to month and age; good attention; no apparent neglect Speech/language: fluent; comprehension intact; object naming intact; repetition  intact Visual Fields are full. Pupils are equal, round, and reactive to light. EOMI without ptosis or diploplia.  Facial sensation is symmetric to temperature Facial movement is symmetric.  Hearing is intact to voice. Uvula midline and palate elevates symmetrically. Shoulder shrug is symmetric. Tongue is midline without atrophy or fasciculations.  Tone is normal. Bulk is normal. 5/5 strength was present in all four extremities. Sensation is symmetric to light touch and temperature in the arms and legs. Deep Tendon Reflexes: 2+ and symmetric in the biceps and patellae. Toes are downgoing bilaterally. FNF and HKS are intact bilaterally. Rapid alternating movements are normal without dysdiadochokinesia   IMAGING/DIAGNOSTIC STUDIES   I have reviewed the images obtained: NCT head showed no signal attenuation concerning for large territory infarct or hyperdensity concerning for an ICH. Remote right cerebellar stroke. CTA head and neck showed no large vessel occlusion or hemodynamically significant stenosis, or other acute vascular abnormality. MRI brain showed no acute infarction, hemorrhage, hydrocephalus, extra-axial collection or mass lesion. Possible small remote right cerebellar infarct seen on 16mm slice.    Lab Results  Component Value Date   HGBA1C 5.4 01/30/2021   Lab Results  Component Value Date   LDLCALC 123 (H) 01/30/2021     ASSESSMENT AND PLAN  LORELEE Buchanan is a 65 y.o. female Hx osteoporosis, scoliosis who presents with brief episode of dysarthria and LUE numbness and drop napkin, lasted 30 sec and resolved after. Pt has hx of migraine and also endorsed anxiety.   Complicated migraine vs. Anxiety, less likely TIA CT no acute abnormality CTA head and neck unremarkable MRI no acute infarct 2D Echo EF 60 to 65%  LDL 123 HgbA1c 5.4 SCDs for VTE prophylaxis  No antithrombotic prior to admission, now on aspirin 81 mg daily and clopidogrel 75 mg daily DAPT for 3 weeks  and then aspirin alone. Patient counseled to be compliant with her antithrombotic medications Ongoing aggressive stroke risk factor management Therapy recommendations: None Disposition: Home today  Hypertension Stable Long term BP goal normotensive  Hyperlipidemia Home meds: None LDL 123, goal < 70 Now on Lipitor 40 Continue statin at discharge  Other Stroke Risk Factors Migraines  Other Active Problems Scoliosis  Hospital day # 0  Neurology will sign off. Please call with questions. Pt will follow up with stroke clinic NP at [facility] in about 4 weeks. Thanks for the consult.   Rosalin Hawking, MD PhD Stroke Neurology 01/31/2021 1:35 PM    To contact Stroke Continuity provider, please refer to [email or web address] After hours, contact General Neurology

## 2021-02-04 ENCOUNTER — Telehealth: Payer: Self-pay

## 2021-02-04 NOTE — Telephone Encounter (Signed)
Patient is calling in stating that she was recently admitted to hospital, no openings until end of July. Please advise where to put patient.

## 2021-02-07 NOTE — Telephone Encounter (Signed)
Patient is scheduled for 02/14/21

## 2021-02-07 NOTE — Telephone Encounter (Signed)
Please get patient scheduled.  °

## 2021-02-09 ENCOUNTER — Inpatient Hospital Stay: Payer: Managed Care, Other (non HMO) | Admitting: Family Medicine

## 2021-02-14 ENCOUNTER — Encounter: Payer: Self-pay | Admitting: Family Medicine

## 2021-02-14 ENCOUNTER — Other Ambulatory Visit: Payer: Self-pay

## 2021-02-14 ENCOUNTER — Ambulatory Visit (INDEPENDENT_AMBULATORY_CARE_PROVIDER_SITE_OTHER): Payer: Managed Care, Other (non HMO) | Admitting: Family Medicine

## 2021-02-14 VITALS — BP 115/77 | HR 94 | Temp 98.0°F | Resp 15 | Wt 161.0 lb

## 2021-02-14 DIAGNOSIS — G459 Transient cerebral ischemic attack, unspecified: Secondary | ICD-10-CM

## 2021-02-14 DIAGNOSIS — R7989 Other specified abnormal findings of blood chemistry: Secondary | ICD-10-CM | POA: Diagnosis not present

## 2021-02-14 DIAGNOSIS — E782 Mixed hyperlipidemia: Secondary | ICD-10-CM | POA: Insufficient documentation

## 2021-02-14 DIAGNOSIS — M810 Age-related osteoporosis without current pathological fracture: Secondary | ICD-10-CM

## 2021-02-14 NOTE — Progress Notes (Signed)
Subjective  CC:  Chief Complaint  Patient presents with   Hospitalization Follow-up    TIA, appt with neuro 7/26,has not received heart monitor from [facility]     HPI: Taylor Buchanan is a 65 y.o. female who presents to the office today to address the problems listed above in the chief complaint. 65 yo female w/o risk factors for vascular disease was hospitalied 6/11 - 6/13: speech disturbance and UE weakness that lasted minutes. I reviewed all notes, h&P, discharge summary and neurology consults. Also reviewed CTA, MRI and lab data. Briefly, TIA with complete resolution of sxs after a few minutes. Workup revealed and old small remote cerebellar infarct but without acute findings. CTA was negative for carotid disease. TEE was unremarkable. Was d/c'd on statin, asa and short course of plavix. TSH was just abofe 6 with low T4.  Since d/c, she is very worried. But feels well w/o further neuro changes. She has had no cardiac sxs or palpitations. She has f/u with neuro scheduled for next month.   Osteoporosis: has had 2 prolia injections. Due 3rd. Needs precert due to new insurance.   Assessment  1. TIA (transient ischemic attack)   2. Osteoporosis without current pathological fracture, unspecified osteoporosis type   3. Abnormal TSH   4. Mixed hyperlipidemia      Plan  TIA:  continue meds; push LDL < 70. Further recs per neuro. Neg arrythmia on tele and no sxs. Will defer to neuro if heart monitoring for longer is needed.  Abnl tsh: will recheck in 8 weeks; if remains low, will dx and treat for hypothyroidism. Education given.  Osteoporosis: vit D calcium and get scheduled for prolia.  HLD; now on lipitor. Return in 8 weeks to recheck lipids and cmp.   Follow up: 8 weeks for tsh and lipids  05/09/2021  No orders of the defined types were placed in this encounter.  No orders of the defined types were placed in this encounter.     I reviewed the patients updated PMH, FH, and SocHx.     Patient Active Problem List   Diagnosis Date Noted   Mixed hyperlipidemia 02/14/2021   Abnormal TSH 02/14/2021   TIA (transient ischemic attack) 01/29/2021   Scoliosis 04/18/2019   Osteoporosis 09/02/2013   Current Meds  Medication Sig   aspirin EC 81 MG EC tablet Take 1 tablet (81 mg total) by mouth daily. Swallow whole.   atorvastatin (LIPITOR) 40 MG tablet Take 1 tablet (40 mg total) by mouth daily.   calcium carbonate (OS-CAL) 600 MG TABS tablet Take 600 mg by mouth 2 (two) times daily with a meal.   cholecalciferol (VITAMIN D) 1000 UNITS tablet Take 1,000 Units by mouth daily.   clopidogrel (PLAVIX) 75 MG tablet Take 1 tablet (75 mg total) by mouth daily.   denosumab (PROLIA) 60 MG/ML SOSY injection Inject 60 mg into the skin every 6 (six) months.    Allergies: Patient has No Known Allergies. Family History: Patient family history includes Bladder Cancer in her father; Dementia in her mother; Healthy in her brother, brother, brother, son, and son; Mental illness in her mother; Stroke in her mother. Social History:  Patient  reports that she has quit smoking. She has never used smokeless tobacco. She reports current alcohol use. She reports that she does not use drugs.  Review of Systems: Constitutional: Negative for fever malaise or anorexia Cardiovascular: negative for chest pain Respiratory: negative for SOB or persistent cough Gastrointestinal: negative for  abdominal pain  Objective  Vitals: BP 115/77   Pulse 94   Temp 98 F (36.7 C) (Temporal)   Resp 15   Wt 161 lb (73 kg)   LMP 08/21/2002   SpO2 97%   BMI 25.60 kg/m  General: no acute distress , A&Ox3 HEENT: PEERL, conjunctiva normal, neck is supple Cardiovascular:  RRR without murmur or gallop.  Respiratory:  Good breath sounds bilaterally, CTAB with normal respiratory effort Skin:  Warm, no rashes    Commons side effects, risks, benefits, and alternatives for medications and treatment plan prescribed  today were discussed, and the patient expressed understanding of the given instructions. Patient is instructed to call or message via MyChart if he/she has any questions or concerns regarding our treatment plan. No barriers to understanding were identified. We discussed Red Flag symptoms and signs in detail. Patient expressed understanding regarding what to do in case of urgent or emergency type symptoms.  Medication list was reconciled, printed and provided to the patient in AVS. Patient instructions and summary information was reviewed with the patient as documented in the AVS. This note was prepared with assistance of Dragon voice recognition software. Occasional wrong-word or sound-a-like substitutions may have occurred due to the inherent limitations of voice recognition software  This visit occurred during the SARS-CoV-2 public health emergency.  Safety protocols were in place, including screening questions prior to the visit, additional usage of staff PPE, and extensive cleaning of exam room while observing appropriate contact time as indicated for disinfecting solutions.

## 2021-02-14 NOTE — Patient Instructions (Signed)
Please return in 12 weeks to recheck cholesterol and thyroid function. Come fasting.    If you have any questions or concerns, please don't hesitate to send me a message via MyChart or call the office at [phone]. Thank you for visiting with [place] today! It's our pleasure caring for you.

## 2021-02-19 ENCOUNTER — Other Ambulatory Visit: Payer: Self-pay | Admitting: Obstetrics and Gynecology

## 2021-03-14 NOTE — Progress Notes (Signed)
[facility] [address] [place]. [place] [address] [phone]       HOSPITAL FOLLOW UP NOTE  Ms. Taylor Buchanan Date of Birth:  [birth year] Medical Record Number:  [id number]   Reason for Referral:  hospital stroke follow up    SUBJECTIVE:   CHIEF COMPLAINT:  Chief Complaint  Patient presents with   Follow-up    Rm 2 alone- Pt here for Hsp f/u. Reports she had a isolated event where her left arm went numb and had trouble getting her words out. TIA was worked up in the hsp. Pt reports since this event doing ok.    HPI:   Taylor Buchanan is a 65 y.o. female Hx osteoporosis, scoliosis who presented on 01/29/2021 with brief episode of dysarthria and LUE numbness and drop napkin, lasted 30 sec and resolved after. Pt has hx of migraine and also endorsed anxiety.  Personally reviewed hospitalization pertinent progress notes, lab work and imaging summary provided.  Evaluated by Dr. Erlinda Hong for likely complicated migraine vs anxiety and less likely TIA.  MRI negative for acute infarct.  CTA head/neck unremarkable.  EF 60 to 65%.  Recommended cardiac Monitor outpatient.  Recommended DAPT for 3 weeks then aspirin alone as well as initiated atorvastatin 40 mg daily for LDL 123.  A1c 5.4.  HTN stable.  Other stroke risk factors include history of migraines and remote cerebellar infarct on imaging.  Cleared by therapies and discharged home.  Today, 03/15/2021, Taylor Buchanan is being seen for hospital follow-up unaccompanied.  She has been stable since discharge without new or reoccurring stroke/TIA symptoms.  She does report slightly increased anxiety but this has been gradually improving.  Completed 3 weeks DAPT and remains on aspirin alone as well as atorvastatin without associated side effects.  Blood pressure today 116/75.  No further concerns at this time.      ROS:   14 system review of systems performed and negative with exception of those listed in HPI  PMH:  Past Medical  History:  Diagnosis Date   Osteoporosis, unspecified 09/02/2013   Scoliosis 04/18/2019   Stroke ([facility])     PSH:  Past Surgical History:  Procedure Laterality Date   CHOLECYSTECTOMY     FOOT SURGERY      Social History:  Social History   Socioeconomic History   Marital status: Married    Spouse name: Not on file   Number of children: 2   Years of education: Not on file   Highest education level: Not on file  Occupational History    Employer: [employer]  Tobacco Use   Smoking status: Former   Smokeless tobacco: Never   Tobacco comments:    1 ppd for 15 years, quit 20 years ago  Substance and Sexual Activity   Alcohol use: Yes    Comment: occ    Drug use: Never   Sexual activity: Yes    Birth control/protection: Post-menopausal  Other Topics Concern   Not on file  Social History Narrative   Work or School: Paediatric nurse [employer] Situation: lives with husband      Spiritual Beliefs: Christian      Lifestyle: no regular exercise; diet good            Social Determinants of Health   Financial Resource Strain: Not on file  Food Insecurity: Not on file  Transportation Needs: Not on file  Physical Activity: Not on file  Stress: Not on file  Social Connections: Not on file  Intimate Partner Violence: Not on file    Family History:  Family History  Problem Relation Age of Onset   Mental illness Mother    Stroke Mother    Dementia Mother    Bladder Cancer Father    Healthy Brother    Healthy Son    Healthy Son    Healthy Brother    Healthy Brother     Medications:   Current Outpatient Medications on File Prior to Visit  Medication Sig Dispense Refill   aspirin EC 81 MG EC tablet Take 1 tablet (81 mg total) by mouth daily. Swallow whole. 30 tablet 11   atorvastatin (LIPITOR) 40 MG tablet Take 1 tablet (40 mg total) by mouth daily. 30 tablet 1   calcium carbonate (OS-CAL) 600 MG TABS tablet Take 600 mg by mouth 2 (two) times daily  with a meal.     cholecalciferol (VITAMIN D) 1000 UNITS tablet Take 1,000 Units by mouth daily.     denosumab (PROLIA) 60 MG/ML SOSY injection Inject 60 mg into the skin every 6 (six) months.     No current facility-administered medications on file prior to visit.    Allergies:  No Known Allergies    OBJECTIVE:  Physical Exam  Vitals:   03/15/21 0917  BP: 116/75  Pulse: 64  Weight: 157 lb (71.2 kg)  Height: '5\' 6"'$  (1.676 m)   Body mass index is 25.34 kg/m. No results found.  Post stroke PHQ 2/9 Depression screen PHQ 2/9 03/15/2021  Decreased Interest 0  Down, Depressed, Hopeless 0  PHQ - 2 Score 0     General: well developed, well nourished, very pleasant middle-age Caucasian female, seated, in no evident distress Head: head normocephalic and atraumatic.   Neck: supple with no carotid or supraclavicular bruits Cardiovascular: regular rate and rhythm, no murmurs Musculoskeletal: no deformity Skin:  no rash/petichiae Vascular:  Normal pulses all extremities   Neurologic Exam Mental Status: Awake and fully alert.  Fluent speech and language.  Oriented to place and time. Recent and remote memory intact. Attention span, concentration and fund of knowledge appropriate. Mood and affect appropriate.  Cranial Nerves: Fundoscopic exam reveals sharp disc margins. Pupils equal, briskly reactive to light. Extraocular movements full without nystagmus. Visual fields full to confrontation. Hearing intact. Facial sensation intact. Face, tongue, palate moves normally and symmetrically.  Motor: Normal bulk and tone. Normal strength in all tested extremity muscles Sensory.: intact to touch , pinprick , position and vibratory sensation.  Coordination: Rapid alternating movements normal in all extremities. Finger-to-nose and heel-to-shin performed accurately bilaterally. Gait and Station: Arises from chair without difficulty. Stance is normal. Gait demonstrates normal stride length and balance  without use of assistive. Tandem walk and heel toe without difficulty.  Reflexes: 1+ and symmetric. Toes downgoing.     NIHSS  0 Modified Rankin  0      ASSESSMENT: Taylor Buchanan is a 65 y.o. year old female recently presented with brief episode of dysarthria and LUE numbness on 01/19/2021 possibly in setting of complicated migraine vs anxiety, less likely TIA.  Vascular risk factors include HLD, remote cerebellar infarct on imaging and migraines.      PLAN:  Transient speech disturbance: No additional events.   TIA: Continue aspirin 81 mg daily  and atorvastatin for secondary stroke prevention.  Discussed secondary stroke prevention measures and importance of close PCP follow up for aggressive stroke risk factor management. I  have gone over the pathophysiology of stroke, warning signs and symptoms, risk factors and their management in some detail with instructions to go to the closest emergency room for symptoms of concern. Complicated migraine: hx of migraines which occur infrequently on nonpharmacological management.  Continue to monitor by PCP Anxiety: good self awareness in regards to anxiety and trying to improve ways to manage symptoms on her own.  Continue to be monitored by PCP HLD: LDL goal <70. Recent LDL 123 -continue atorvastatin 40 mg daily.  Request follow-up with PCP in the next 1 to 2 months for repeat lipid panel and ongoing prescribing of statin    Follow up in 6 months or call earlier if needed   CC:  [place] provider: Dr. Leonie Man PCP: Leamon Arnt, MD    I spent 43 minutes of face-to-face and non-face-to-face time with patient.  This included previsit chart review including review of recent hospitalization, lab review, study review, electronic health record documentation, patient education regarding recent event and possible etiologies, secondary stroke prevention measures and aggressive stroke risk factor management, history of migraines and importance of managing  stress levels and answered all other questions to patient satisfaction  Frann Rider, AGNP-BC  [facility] [address] [place], [place] [address]  Phone [phone] Fax [phone] Note: This document was prepared with digital dictation and possible smart phrase technology. Any transcriptional errors that result from this process are unintentional.

## 2021-03-15 ENCOUNTER — Ambulatory Visit (INDEPENDENT_AMBULATORY_CARE_PROVIDER_SITE_OTHER): Payer: Managed Care, Other (non HMO) | Admitting: Adult Health

## 2021-03-15 ENCOUNTER — Encounter: Payer: Self-pay | Admitting: Adult Health

## 2021-03-15 ENCOUNTER — Other Ambulatory Visit: Payer: Self-pay

## 2021-03-15 VITALS — BP 116/75 | HR 64 | Ht 66.0 in | Wt 157.0 lb

## 2021-03-15 DIAGNOSIS — R479 Unspecified speech disturbances: Secondary | ICD-10-CM | POA: Diagnosis not present

## 2021-03-15 DIAGNOSIS — E785 Hyperlipidemia, unspecified: Secondary | ICD-10-CM

## 2021-03-15 NOTE — Patient Instructions (Signed)
Continue aspirin 81 mg daily  and atorvastatin 40 mg daily for secondary stroke prevention  Continue to follow up with PCP regarding cholesterol and blood pressure management  Maintain strict control of hypertension with blood pressure goal below 130/90 and cholesterol with LDL cholesterol (bad cholesterol) goal below 70 mg/dL.       Followup in the future with me in 6 months or call earlier if needed       Thank you for coming to see [place] at [facility]. I hope we have been able to provide you high quality care today.  You may receive a patient satisfaction survey over the next few weeks. We would appreciate your feedback and comments so that we may continue to improve ourselves and the health of our patients.

## 2021-03-15 NOTE — Progress Notes (Signed)
I agree with the above plan 

## 2021-03-27 ENCOUNTER — Other Ambulatory Visit: Payer: Self-pay | Admitting: Obstetrics and Gynecology

## 2021-04-01 ENCOUNTER — Other Ambulatory Visit: Payer: Self-pay | Admitting: Family Medicine

## 2021-04-01 ENCOUNTER — Telehealth: Payer: Self-pay

## 2021-04-01 MED ORDER — ATORVASTATIN CALCIUM 40 MG PO TABS: 40.0000 mg | ORAL_TABLET | Freq: Every day | ORAL | 1 refills | Status: DC

## 2021-04-01 NOTE — Telephone Encounter (Signed)
Rx sent to pharmacy   

## 2021-04-01 NOTE — Telephone Encounter (Signed)
MEDICATION: atorvastatin (LIPITOR) 40 MG tablet  PHARMACY:  [facility],  - [address] [place] [address] [place] [address] Phone:  [phone]  Fax:  [phone]      Comments:   **Let patient know to contact pharmacy at the end of the day to make sure medication is ready. **  ** Please notify patient to allow 48-72 hours to process**  **Encourage patient to contact the pharmacy for refills or they can request refills through [facility]**

## 2021-04-25 ENCOUNTER — Emergency Department (HOSPITAL_BASED_OUTPATIENT_CLINIC_OR_DEPARTMENT_OTHER)
Admission: EM | Admit: 2021-04-25 | Discharge: 2021-04-25 | Disposition: A | Discharge status: Home / Self Care | Payer: Managed Care, Other (non HMO) | Attending: Emergency Medicine | Admitting: Emergency Medicine

## 2021-04-25 ENCOUNTER — Encounter (HOSPITAL_BASED_OUTPATIENT_CLINIC_OR_DEPARTMENT_OTHER): Payer: Self-pay | Admitting: Emergency Medicine

## 2021-04-25 ENCOUNTER — Other Ambulatory Visit: Payer: Self-pay

## 2021-04-25 ENCOUNTER — Emergency Department (HOSPITAL_BASED_OUTPATIENT_CLINIC_OR_DEPARTMENT_OTHER): Payer: Managed Care, Other (non HMO)

## 2021-04-25 DIAGNOSIS — Z20822 Contact with and (suspected) exposure to covid-19: Secondary | ICD-10-CM | POA: Insufficient documentation

## 2021-04-25 DIAGNOSIS — K2970 Gastritis, unspecified, without bleeding: Secondary | ICD-10-CM | POA: Diagnosis not present

## 2021-04-25 DIAGNOSIS — Z87891 Personal history of nicotine dependence: Secondary | ICD-10-CM | POA: Diagnosis not present

## 2021-04-25 DIAGNOSIS — Z7982 Long term (current) use of aspirin: Secondary | ICD-10-CM | POA: Insufficient documentation

## 2021-04-25 DIAGNOSIS — R110 Nausea: Secondary | ICD-10-CM | POA: Diagnosis present

## 2021-04-25 LAB — CBC WITH DIFFERENTIAL/PLATELET
Abs Immature Granulocytes: 0.05 10*3/uL (ref 0.00–0.07)
Basophils Absolute: 0.1 10*3/uL (ref 0.0–0.1)
Basophils Relative: 1 %
Eosinophils Absolute: 0 10*3/uL (ref 0.0–0.5)
Eosinophils Relative: 0 %
HCT: 44.3 % (ref 36.0–46.0)
Hemoglobin: 14.9 g/dL (ref 12.0–15.0)
Immature Granulocytes: 1 %
Lymphocytes Relative: 15 %
Lymphs Abs: 1.5 10*3/uL (ref 0.7–4.0)
MCH: 31.9 pg (ref 26.0–34.0)
MCHC: 33.6 g/dL (ref 30.0–36.0)
MCV: 94.9 fL (ref 80.0–100.0)
Monocytes Absolute: 0.4 10*3/uL (ref 0.1–1.0)
Monocytes Relative: 4 %
Neutro Abs: 7.5 10*3/uL (ref 1.7–7.7)
Neutrophils Relative %: 79 %
Platelets: 358 10*3/uL (ref 150–400)
RBC: 4.67 MIL/uL (ref 3.87–5.11)
RDW: 13.2 % (ref 11.5–15.5)
WBC: 9.5 10*3/uL (ref 4.0–10.5)
nRBC: 0 % (ref 0.0–0.2)

## 2021-04-25 LAB — COMPREHENSIVE METABOLIC PANEL
ALT: 28 U/L (ref 0–44)
AST: 32 U/L (ref 15–41)
Albumin: 4.4 g/dL (ref 3.5–5.0)
Alkaline Phosphatase: 84 U/L (ref 38–126)
Anion gap: 12 (ref 5–15)
BUN: 18 mg/dL (ref 8–23)
CO2: 24 mmol/L (ref 22–32)
Calcium: 9.7 mg/dL (ref 8.9–10.3)
Chloride: 104 mmol/L (ref 98–111)
Creatinine, Ser: 0.76 mg/dL (ref 0.44–1.00)
GFR, Estimated: >60 mL/min (ref >60)
Glucose, Bld: 115 mg/dL — ABNORMAL HIGH (ref 70–99)
Potassium: 3.4 mmol/L — ABNORMAL LOW (ref 3.5–5.1)
Sodium: 140 mmol/L (ref 135–145)
Total Bilirubin: 0.8 mg/dL (ref 0.3–1.2)
Total Protein: 7.9 g/dL (ref 6.5–8.1)

## 2021-04-25 LAB — RESP PANEL BY RT-PCR (FLU A&B, COVID) ARPGX2
Influenza A by PCR: NEGATIVE
Influenza B by PCR: NEGATIVE
SARS Coronavirus 2 by RT PCR: NEGATIVE

## 2021-04-25 LAB — TROPONIN I (HIGH SENSITIVITY): Troponin I (High Sensitivity): 2 ng/L (ref <18)

## 2021-04-25 LAB — LIPASE, BLOOD: Lipase: 27 U/L (ref 11–51)

## 2021-04-25 MED ORDER — SUCRALFATE 1 G PO TABS: 1.0000 g | ORAL_TABLET | Freq: Three times a day (TID) | ORAL | 0 refills | Status: DC | PRN

## 2021-04-25 MED ORDER — ONDANSETRON 4 MG PO TBDP: 4.0000 mg | ORAL_TABLET | Freq: Three times a day (TID) | ORAL | 0 refills | Status: DC | PRN

## 2021-04-25 MED ORDER — ONDANSETRON HCL 4 MG/2ML IJ SOLN
4.0000 mg | Freq: Once | INTRAMUSCULAR | Status: AC
  Administered 2021-04-25: 4 mg via INTRAVENOUS
  Filled 2021-04-25: qty 2

## 2021-04-25 MED ORDER — FAMOTIDINE IN NACL 20-0.9 MG/50ML-% IV SOLN
20.0000 mg | Freq: Once | INTRAVENOUS | Status: AC
  Administered 2021-04-25: 20 mg via INTRAVENOUS
  Filled 2021-04-25: qty 50

## 2021-04-25 MED ORDER — PANTOPRAZOLE SODIUM 20 MG PO TBEC: 20.0000 mg | DELAYED_RELEASE_TABLET | Freq: Every day | ORAL | 0 refills | Status: DC

## 2021-04-25 MED ORDER — HYDROXYZINE HCL 25 MG PO TABS: 25.0000 mg | ORAL_TABLET | Freq: Four times a day (QID) | ORAL | 0 refills | Status: DC | PRN

## 2021-04-25 MED ORDER — SODIUM CHLORIDE 0.9 % IV BOLUS
1000.0000 mL | Freq: Once | INTRAVENOUS | Status: AC
Start: 2021-04-25 — End: 2021-04-25
  Administered 2021-04-25: 1000 mL via INTRAVENOUS

## 2021-04-25 NOTE — ED Triage Notes (Signed)
Pt from home complains of nausea for the past 3 days and indigestion. Pt denis any emesis or pain. Pt states she has not bed able to eat much. Pt complains of lethargy.

## 2021-04-25 NOTE — ED Provider Notes (Signed)
[facility] EMERGENCY DEPT Provider Note   CSN: [id number] Arrival date & time: 04/25/21  0907     History Chief Complaint  Patient presents with   Nausea    Taylor Buchanan is a 65 y.o. female.  65 year old female with history as below presented to the ER secondary to nausea.  Indigestion.  Generalized malaise.  Onset approximately 3 days ago.  Reduced appetite.  Symptoms not exacerbated or improved by oral intake.  She has been taking Pepto-Bismol at home which has improved her symptoms.  Does report intermittent chest tightness the last couple days that she attributes to anxiety.  No current chest pain.  No sick contacts or recent travel.  Denies recent medication or dietary changes.  No change to bowel or bladder function.  No vomiting.  No abdominal pain.  Denies fevers or chills.   The history is provided by the patient and the spouse. No language interpreter was used.      Past Medical History:  Diagnosis Date   Osteoporosis, unspecified 09/02/2013   Scoliosis 04/18/2019   Stroke [facility])     Patient Active Problem List   Diagnosis Date Noted   Mixed hyperlipidemia 02/14/2021   Abnormal TSH 02/14/2021   TIA (transient ischemic attack) 01/29/2021   Scoliosis 04/18/2019   Osteoporosis 09/02/2013    Past Surgical History:  Procedure Laterality Date   CHOLECYSTECTOMY     FOOT SURGERY       OB History   No obstetric history on file.     Family History  Problem Relation Age of Onset   Mental illness Mother    Stroke Mother    Dementia Mother    Bladder Cancer Father    Healthy Brother    Healthy Son    Healthy Son    Healthy Brother    Healthy Brother     Social History   Tobacco Use   Smoking status: Former   Smokeless tobacco: Never   Tobacco comments:    1 ppd for 15 years, quit 20 years ago  Substance Use Topics   Alcohol use: Yes    Comment: occ    Drug use: Never    Home Medications Prior to Admission medications   Medication  Sig Start Date End Date Taking? Authorizing Provider  aspirin EC 81 MG EC tablet Take 1 tablet (81 mg total) by mouth daily. Swallow whole. 02/01/21  Yes Wouk, Ailene Rud, MD  atorvastatin (LIPITOR) 40 MG tablet Take 1 tablet (40 mg total) by mouth daily. 04/01/21  Yes Leamon Arnt, MD  calcium carbonate (OS-CAL) 600 MG TABS tablet Take 600 mg by mouth 2 (two) times daily with a meal.   Yes [provider]  cholecalciferol (VITAMIN D) 1000 UNITS tablet Take 1,000 Units by mouth daily.   Yes [provider]  hydrOXYzine (ATARAX/VISTARIL) 25 MG tablet Take 1 tablet (25 mg total) by mouth every 6 (six) hours as needed. 04/25/21  Yes Wynona Dove A, DO  ondansetron (ZOFRAN ODT) 4 MG disintegrating tablet Take 1 tablet (4 mg total) by mouth every 8 (eight) hours as needed for nausea or vomiting. 04/25/21  Yes Wynona Dove A, DO  pantoprazole (PROTONIX) 20 MG tablet Take 1 tablet (20 mg total) by mouth daily for 14 days. 04/25/21 05/09/21 Yes Wynona Dove A, DO  sucralfate (CARAFATE) 1 g tablet Take 1 tablet (1 g total) by mouth 3 (three) times daily as needed. 04/25/21  Yes Wynona Dove A, DO  denosumab (  PROLIA) 60 MG/ML SOSY injection Inject 60 mg into the skin every 6 (six) months.    [provider]    Allergies    Patient has no known allergies.  Review of Systems   Review of Systems  Constitutional:  Positive for fatigue. Negative for chills and fever.  HENT:  Negative for facial swelling and trouble swallowing.   Eyes:  Negative for photophobia and visual disturbance.  Respiratory:  Negative for cough and shortness of breath.   Cardiovascular:  Positive for chest pain. Negative for palpitations.  Gastrointestinal:  Positive for nausea. Negative for abdominal pain and vomiting.  Endocrine: Negative for polydipsia and polyuria.  Genitourinary:  Negative for difficulty urinating and hematuria.  Musculoskeletal:  Negative for gait problem and joint swelling.  Skin:   Negative for pallor and rash.  Neurological:  Negative for syncope and headaches.  Psychiatric/Behavioral:  Negative for agitation and confusion. The patient is nervous/anxious.    Physical Exam Updated Vital Signs BP 107/72 (BP Location: Right Arm)   Pulse (!) 59   Temp 98.6 F (37 C) (Oral)   Resp 18   Ht '5\' 6"'$  (1.676 m)   Wt 68 kg   LMP 08/21/2002   SpO2 99%   BMI 24.21 kg/m   Physical Exam Vitals and nursing note reviewed.  Constitutional:      General: She is not in acute distress.    Appearance: Normal appearance.  HENT:     Head: Normocephalic and atraumatic.     Right Ear: External ear normal.     Left Ear: External ear normal.     Nose: Nose normal.     Mouth/Throat:     Mouth: Mucous membranes are moist.  Eyes:     General: No visual field deficit or scleral icterus.       Right eye: No discharge.        Left eye: No discharge.     Extraocular Movements: Extraocular movements intact.     Pupils: Pupils are equal, round, and reactive to light.  Cardiovascular:     Rate and Rhythm: Normal rate and regular rhythm.     Pulses: Normal pulses.     Heart sounds: Normal heart sounds.  Pulmonary:     Effort: Pulmonary effort is normal. No respiratory distress.     Breath sounds: Normal breath sounds.  Abdominal:     General: Abdomen is flat.     Palpations: Abdomen is soft.     Tenderness: There is no abdominal tenderness. There is no guarding.  Musculoskeletal:        General: Normal range of motion.     Cervical back: Normal range of motion.     Right lower leg: No edema.     Left lower leg: No edema.  Skin:    General: Skin is warm and dry.     Capillary Refill: Capillary refill takes less than 2 seconds.  Neurological:     General: No focal deficit present.     Mental Status: She is alert and oriented to person, place, and time.     GCS: GCS eye subscore is 4. GCS verbal subscore is 5. GCS motor subscore is 6.     Cranial Nerves: Cranial nerves are  intact. No facial asymmetry.     Sensory: Sensation is intact.     Motor: Motor function is intact.     Coordination: Coordination is intact.     Gait: Gait is intact.  Psychiatric:  Mood and Affect: Mood normal.        Behavior: Behavior normal.    ED Results / Procedures / Treatments   Labs (all labs ordered are listed, but only abnormal results are displayed) Labs Reviewed  COMPREHENSIVE METABOLIC PANEL - Abnormal; Notable for the following components:      Result Value   Potassium 3.4 (*)    Glucose, Bld 115 (*)    All other components within normal limits  RESP PANEL BY RT-PCR (FLU A&B, COVID) ARPGX2  CBC WITH DIFFERENTIAL/PLATELET  LIPASE, BLOOD  TROPONIN I (HIGH SENSITIVITY)    EKG EKG Interpretation  Date/Time:  Monday April 25 2021 10:17:55 EDT Ventricular Rate:  75 PR Interval:  137 QRS Duration: 88 QT Interval:  399 QTC Calculation: 446 R Axis:   51 Text Interpretation: Sinus rhythm Abnormal R-wave progression, early transition Similar to prior tracing Confirmed by Wynona Dove ([id number]) on 04/25/2021 1:53:50 PM  Radiology DG Chest Port 1 View  Result Date: 04/25/2021 CLINICAL DATA:  Nausea and abdominal pain EXAM: PORTABLE CHEST 1 VIEW COMPARISON:  01/30/2021 FINDINGS: Normal heart size and mediastinal contours. Mild interstitial prominence which is stable. There is no edema, consolidation, effusion, or pneumothorax. S-shaped scoliosis of the thoracolumbar spine. IMPRESSION: Stable exam.  No acute finding. Electronically Signed   By: Monte Fantasia M.D.   On: 04/25/2021 10:17    Procedures Procedures   Medications Ordered in ED Medications  famotidine (PEPCID) IVPB 20 mg premix (0 mg Intravenous Stopped 04/25/21 1054)  ondansetron (ZOFRAN) injection 4 mg (4 mg Intravenous Given 04/25/21 1024)  sodium chloride 0.9 % bolus 1,000 mL (0 mLs Intravenous Stopped 04/25/21 1126)    ED Course  I have reviewed the triage vital signs and the nursing  notes.  Pertinent labs & imaging results that were available during my care of the patient were reviewed by me and considered in my medical decision making (see chart for details).    MDM Rules/Calculators/A&P                           65 year old female with history as above presented ER secondary to nausea.  Indigestion.  Abdomen is soft, nontender, nonperitoneal.  No chest pain, dyspnea.  Vital signs reviewed and are stable.  Serious etiology considered.  Labs reviewed and are stable.   Potassium is minimally depleted, no EKG changes.  Discussed dietary changes with the patient. Given history of possible TIA and complaint of nausea will obtain trop/ecg. ECG without evidence acute ischemia, troponin is negative.  Chest x-ray negative. No chest pain, SOB, diaphoresis.   ACS is unlikely.  COVID-19 testing negative.  The patient improved significantly and was discharged in stable condition. Detailed discussions were had with the patient regarding current findings, and need for close f/u with PCP or on call doctor. The patient has been instructed to return immediately if the symptoms worsen in any way for re-evaluation. Patient verbalized understanding and is in agreement with current care plan. All questions answered prior to discharge.      Final Clinical Impression(s) / ED Diagnoses Final diagnoses:  Gastritis, presence of bleeding unspecified, unspecified chronicity, unspecified gastritis type  Nausea    Rx / DC Orders ED Discharge Orders          Ordered    sucralfate (CARAFATE) 1 g tablet  3 times daily PRN        04/25/21 1351    ondansetron (ZOFRAN ODT) 4  MG disintegrating tablet  Every 8 hours PRN        04/25/21 1351    pantoprazole (PROTONIX) 20 MG tablet  Daily        04/25/21 1351    hydrOXYzine (ATARAX/VISTARIL) 25 MG tablet  Every 6 hours PRN        04/25/21 1404             Jeanell Sparrow, DO 04/26/21 1416

## 2021-04-26 ENCOUNTER — Encounter: Payer: Self-pay | Admitting: Internal Medicine

## 2021-04-27 ENCOUNTER — Other Ambulatory Visit: Payer: Self-pay

## 2021-04-27 ENCOUNTER — Ambulatory Visit (INDEPENDENT_AMBULATORY_CARE_PROVIDER_SITE_OTHER): Payer: Managed Care, Other (non HMO) | Admitting: Family Medicine

## 2021-04-27 ENCOUNTER — Encounter: Payer: Self-pay | Admitting: Family Medicine

## 2021-04-27 VITALS — BP 120/78 | HR 91 | Temp 98.1°F | Ht 66.0 in | Wt 152.0 lb

## 2021-04-27 DIAGNOSIS — E782 Mixed hyperlipidemia: Secondary | ICD-10-CM

## 2021-04-27 DIAGNOSIS — K2900 Acute gastritis without bleeding: Secondary | ICD-10-CM | POA: Diagnosis not present

## 2021-04-27 DIAGNOSIS — R7989 Other specified abnormal findings of blood chemistry: Secondary | ICD-10-CM

## 2021-04-27 DIAGNOSIS — F410 Panic disorder [episodic paroxysmal anxiety] without agoraphobia: Secondary | ICD-10-CM | POA: Diagnosis not present

## 2021-04-27 LAB — T3: T3, Total: 115 ng/dL (ref 76–181)

## 2021-04-27 MED ORDER — ESCITALOPRAM OXALATE 10 MG PO TABS: 10.0000 mg | ORAL_TABLET | Freq: Every day | ORAL | 2 refills | Status: DC

## 2021-04-27 MED ORDER — ALPRAZOLAM 0.5 MG PO TABS: 0.2500 mg | ORAL_TABLET | Freq: Every day | ORAL | 0 refills | Status: DC | PRN

## 2021-04-27 MED ORDER — PANTOPRAZOLE SODIUM 20 MG PO TBEC: 20.0000 mg | DELAYED_RELEASE_TABLET | Freq: Every day | ORAL | 2 refills | Status: DC

## 2021-04-27 NOTE — Patient Instructions (Signed)
Please return in 6 weeks for recheck mood. Please cancel the next scheduled appt.   I will release your lab results to you on your MyChart account with further instructions. Please reply with any questions.    Take the pantoprazole for 4-12 weeks for your stomach symptoms.   Start the lexapro as we discussed. Use the xanax if needed for anxiety or panic.   If you have any questions or concerns, please don't hesitate to send me a message via MyChart or call the office at [phone]. Thank you for visiting with Taylor Buchanan today! It's our pleasure caring for you.   Depression/anxiety Medications:  Taking the medicine as directed and not missing any doses is one of the best things you can do to treat your depression.  Here are some things to keep in mind:  Side effects (stomach upset, some increased anxiety) may happen before you notice a benefit.  These side effects typically go away over time. Changes to your dose of medicine or a change in medication all together is sometimes necessary Most people need to be on medication at least 6-12 months Many people will notice an improvement within two weeks but the full effect of the medication can take up to 4-6 weeks Stopping the medication when you start feeling better often results in a return of symptoms If you start having thoughts of hurting yourself or others after starting this medicine, please call the office immediately at [phone].    Panic Attack A panic attack is a sudden episode of severe anxiety, fear, or discomfort that causes physical and emotional symptoms. The attack may be in response to something frightening, or it may occur for no known reason. Symptoms of a panic attack can be similar to symptoms of a heart attack or stroke. It is important to see your health care provider when you have a panic attack so that these conditions can be ruled out. A panic attack is a symptom of another condition. Most panic attacks go away with  treatment of the underlying problem. If you have panic attacks often, you may have a condition called panic disorder. What are the causes? A panic attack may be caused by: An extreme, life-threatening situation, such as a war or natural disaster. An anxiety disorder, such as post-traumatic stress disorder. Depression. Certain medical conditions, including heart problems, neurological conditions, and infections. Certain over-the-counter and prescription medicines. Illegal drugs that increase heart rate and blood pressure, such as methamphetamine. Alcohol. Supplements that increase anxiety. Panic disorder. What increases the risk? You are more likely to develop this condition if: You have an anxiety disorder. You have another mental health condition. You take certain medicines. You use alcohol, illegal drugs, or other substances. You are under extreme stress. A life event is causing increased feelings of anxiety and depression. What are the signs or symptoms? A panic attack starts suddenly, usually lasts about 20 minutes, and occurs with one or more of the following: A pounding heart. A feeling that your heart is beating irregularly or faster than normal (palpitations). Sweating. Trembling or shaking. Shortness of breath or feeling smothered. Feeling choked. Chest pain or discomfort. Nausea or a strange feeling in your stomach. Dizziness, feeling lightheaded, or feeling like you might faint. Chills or hot flashes. Numbness or tingling in your lips, hands, or feet. Feeling confused, or feeling that you are not yourself. Fear of losing control or being emotionally unstable. Fear of dying. How is this diagnosed? A panic attack is diagnosed with an  assessment by your health care provider. During the assessment your health care provider will ask questions about: Your history of anxiety, depression, and panic attacks. Your medical history. Whether you drink alcohol, use illegal drugs,  take supplements, or take medicines. Be honest about your substance use. Your health care provider may also: Order blood tests or other kinds of tests to rule out serious medical conditions. Refer you to a mental health professional for further evaluation. How is this treated? Treatment depends on the cause of the panic attack: If the cause is a medical problem, your health care provider will either treat that problem or refer you to a specialist. If the cause is emotional, you may be given anti-anxiety medicines or referred to a counselor. These medicines may reduce how often attacks happen, reduce how severe the attacks are, and lower anxiety. If the cause is a medicine, your health care provider may tell you to stop the medicine, change your dose, or take a different medicine. If the cause is a drug, treatment may involve letting the drug wear off and taking medicine to help the drug leave your body or to counteract its effects. Attacks caused by drug abuse may continue even if you stop using the drug. Follow these instructions at home: Take over-the-counter and prescription medicines only as told by your health care provider. If you feel anxious, limit your caffeine intake. Take good care of your physical and mental health by: Eating a balanced diet that includes plenty of fresh fruits and vegetables, whole grains, lean meats, and low-fat dairy. Getting plenty of rest. Try to get 7-8 hours of uninterrupted sleep each night. Exercising regularly. Try to get 30 minutes of physical activity at least 5 days a week. Not smoking. Talk to your health care provider if you need help quitting. Limiting alcohol intake to no more than 1 drink a day for nonpregnant women and 2 drinks a day for men. One drink equals 12 oz of beer, 5 oz of wine, or 1 oz of hard liquor. Keep all follow-up visits as told by your health care provider. This is important. Panic attacks may have underlying physical or emotional  problems that take time to accurately diagnose. Contact a health care provider if: Your symptoms do not improve, or they get worse. You are not able to take your medicine as prescribed because of side effects. Get help right away if: You have serious thoughts about hurting yourself or others. You have symptoms of a panic attack. Do not drive yourself to the hospital. Have someone else drive you or call an ambulance. If you ever feel like you may hurt yourself or others, or you have thoughts about taking your own life, get help right away. You can go to your nearest emergency department or call: Your local emergency services (911 in the [place] A suicide crisis helpline, such as the [place] at (780)398-8266. This is open 24 hours a day. Summary A panic attack is a sign of a serious health or mental health condition. Get help right away. Do not drive yourself to the hospital. Have someone else drive you or call an ambulance. Always see a health care provider to have the reasons for the panic attack correctly diagnosed. If your panic attack was caused by a physical problem, follow your health care provider's suggestions for medicine, referral to a specialist, and lifestyle changes. If your panic attack was caused by an emotional problem, follow through with counseling from a qualified  mental health specialist. If you feel like you may hurt yourself or others, call 911 and get help right away. This information is not intended to replace advice given to you by your health care provider. Make sure you discuss any questions you have with your health care provider. Document Revised: 02/05/2020 Document Reviewed: 02/05/2020 Elsevier Patient Education  [place].

## 2021-04-27 NOTE — Progress Notes (Signed)
Subjective  CC:  Chief Complaint  Patient presents with   Gastritis   Nausea    Has improved with medication    HPI: Taylor Buchanan is a 65 y.o. female who presents to the office today to address the problems listed above in the chief complaint, mood problems. 65 year old female here after ER visit 2 days ago.  Unfortunately, she has not done well since her hospital stay several months back for possible TIA versus migraine equivalent.  We discussed her symptoms in detail.  Currently having very active anxiety symptoms including panic attacks, daily worry, poor sleep, and some depressive symptoms.  She has no history of mood disorder but does admit to being a high stress person in general.  However, her symptoms are now very limiting.  Not wanting to go out.  Also with upper abdominal symptoms including pain with eating, tightness in the upper abdomen and some nausea.  ER evaluation was negative for cardiac etiology and thought related to stress-induced gastritis.  She is started on a PPI, sulcrafate and an antiemetic.  Symptoms have slightly improved.  She denies further chest pain.  No shortness of breath or diaphoresis.  No melena. History of TIA versus migraine equivalent: Reviewed neurology follow-up and recommendations.  Not completely clear what her speech disturbance episode was caused by but they were leaning towards migraine equivalent.  However, either diagnosis is anxiety provoking for patient.  She worries that it will happen again.  She is fearful that a serious event will happen. Hyperlipidemia: She is taking the statin and is due for recheck.  She is tolerating this well. Abnormal TSH due for recheck.  TSH was mildly above normal.  She is having symptoms more consistent with hyperthyroidism but will recheck today.  No history of thyroid disorders.  Depression screen [facility] 2/9 03/15/2021 04/18/2019  Decreased Interest 0 0  Down, Depressed, Hopeless 0 0  PHQ - 2 Score 0 0   GAD 7 :  Generalized Anxiety Score 04/27/2021  Nervous, Anxious, on Edge 3  Control/stop worrying 3  Worry too much - different things 3  Trouble relaxing 3  Restless 0  Easily annoyed or irritable 1  Afraid - awful might happen 3  Total GAD 7 Score 16  Anxiety Difficulty Extremely difficult    Previously on prescription medications for mood/anxiety: never Therapist/counseling: a few times in past during hard times. No dix Previous Diagnosis of psychiatric disorder: none Family history of psychiatric disorder: none  Assessment  1. Panic disorder   2. Other acute gastritis without hemorrhage   3. Abnormal TSH   4. Mixed hyperlipidemia      Plan  Depression/anxiety: Possible generalized anxiety disorder with panic disorder.  Very active now.  Due to recent medical events.  Counseling done.  Start Lexapro 10 and Xanax as needed.  Close follow-up.  Can consider therapy if we can get her symptoms calm down Abnormal TSH: Recheck today Hyperlipidemia on statin.  Recheck today Gastritis likely stress-induced, reassured.  Continue Protonix 20 mg daily for the next 6 to 12 weeks.  May use the other medications as needed. History of TIA versus migraine equivalent: Education counseling done.  Push LDL to less than 70 and monitor for headaches.  Reassured.  I spent a total of 52 minutes for this patient encounter. Time spent included preparation, face-to-face counseling with the patient and coordination of care, review of chart and records, and documentation of the encounter.   Follow up: 6 weeks from  follow-up Orders Placed This Encounter  Procedures   TSH   T3   T4, free   Lipid panel   Comprehensive metabolic panel   Meds ordered this encounter  Medications   escitalopram (LEXAPRO) 10 MG tablet    Sig: Take 1 tablet (10 mg total) by mouth daily.    Dispense:  30 tablet    Refill:  2   ALPRAZolam (XANAX) 0.5 MG tablet    Sig: Take 0.5-1 tablets (0.25-0.5 mg total) by mouth daily as needed  for anxiety (panic symptoms).    Dispense:  15 tablet    Refill:  0   pantoprazole (PROTONIX) 20 MG tablet    Sig: Take 1 tablet (20 mg total) by mouth daily.    Dispense:  30 tablet    Refill:  2      I reviewed the patients updated PMH, FH, and SocHx.    Patient Active Problem List   Diagnosis Date Noted   Mixed hyperlipidemia 02/14/2021   Abnormal TSH 02/14/2021   TIA (transient ischemic attack) 01/29/2021   Scoliosis 04/18/2019   Osteoporosis 09/02/2013   Current Meds  Medication Sig   ALPRAZolam (XANAX) 0.5 MG tablet Take 0.5-1 tablets (0.25-0.5 mg total) by mouth daily as needed for anxiety (panic symptoms).   aspirin EC 81 MG EC tablet Take 1 tablet (81 mg total) by mouth daily. Swallow whole.   atorvastatin (LIPITOR) 40 MG tablet Take 1 tablet (40 mg total) by mouth daily.   cholecalciferol (VITAMIN D) 1000 UNITS tablet Take 1,000 Units by mouth daily.   denosumab (PROLIA) 60 MG/ML SOSY injection Inject 60 mg into the skin every 6 (six) months.   escitalopram (LEXAPRO) 10 MG tablet Take 1 tablet (10 mg total) by mouth daily.   ondansetron (ZOFRAN ODT) 4 MG disintegrating tablet Take 1 tablet (4 mg total) by mouth every 8 (eight) hours as needed for nausea or vomiting.   [DISCONTINUED] hydrOXYzine (ATARAX/VISTARIL) 25 MG tablet Take 1 tablet (25 mg total) by mouth every 6 (six) hours as needed.   [DISCONTINUED] pantoprazole (PROTONIX) 20 MG tablet Take 1 tablet (20 mg total) by mouth daily for 14 days.   [DISCONTINUED] sucralfate (CARAFATE) 1 g tablet Take 1 tablet (1 g total) by mouth 3 (three) times daily as needed.    Allergies: Patient has No Known Allergies. Family history:  Patient family history includes Bladder Cancer in her father; Dementia in her mother; Healthy in her brother, brother, brother, son, and son; Mental illness in her mother; Stroke in her mother. 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      Spiritual Beliefs: Christian      Lifestyle: no regular exercise; diet  good            Social Determinants of Radio broadcast assistant Strain: Not on file  Food Insecurity: Not on file  Transportation Needs: Not on file  Physical Activity: Not on file  Stress: Not on file  Social Connections: Not on file     Review of Systems: Constitutional: Negative for fever malaise or anorexia Cardiovascular: negative for chest pain Respiratory: negative for SOB or persistent cough Gastrointestinal: negative for abdominal pain  Objective  Vitals: BP 120/78   Pulse 91   Temp 98.1 F (36.7 C)   Ht '5\' 6"'$  (1.676 m)   Wt 152 lb (68.9 kg)   LMP 08/21/2002   SpO2 96%   BMI 24.53 kg/m  General: no acute distress, well appearing, no apparent distress, well groomed Psych:  Alert and oriented x 3,anxious and normal speech, good insight .  Cardiovascular:  RRR without murmur or gallop. no peripheral edema Respiratory:  Good breath sounds bilaterally, CTAB with normal respiratory effort Skin:  Warm, no rashes  Lab Results  Component Value Date   TSH 6.574 (H) 01/29/2021    Commons side effects, risks, benefits, and alternatives for medications and treatment plan prescribed today were discussed, and the patient expressed understanding of the given instructions. Patient is instructed to call or message via  MyChart if he/she has any questions or concerns regarding our treatment plan. No barriers to understanding were identified. We discussed Red Flag symptoms and signs in detail. Patient expressed understanding regarding what to do in case of urgent or emergency type symptoms.  Medication list was reconciled, printed and provided to the patient in AVS. Patient instructions and summary information was reviewed with the patient as documented in the AVS. This note was prepared with assistance of Dragon voice recognition software. Occasional wrong-word or sound-a-like substitutions may have occurred due to the inherent limitations of voice recognition software

## 2021-04-28 LAB — COMPREHENSIVE METABOLIC PANEL
ALT: 21 U/L (ref 0–35)
AST: 24 U/L (ref 0–37)
Albumin: 4.5 g/dL (ref 3.5–5.2)
Alkaline Phosphatase: 81 U/L (ref 39–117)
BUN: 16 mg/dL (ref 6–23)
CO2: 25 mEq/L (ref 19–32)
Calcium: 9.8 mg/dL (ref 8.4–10.5)
Chloride: 104 mEq/L (ref 96–112)
Creatinine, Ser: 0.79 mg/dL (ref 0.40–1.20)
GFR: 78.92 mL/min (ref >60.00)
Glucose, Bld: 85 mg/dL (ref 70–99)
Potassium: 3.7 mEq/L (ref 3.5–5.1)
Sodium: 139 mEq/L (ref 135–145)
Total Bilirubin: 0.7 mg/dL (ref 0.2–1.2)
Total Protein: 7.7 g/dL (ref 6.0–8.3)

## 2021-04-28 LAB — T4, FREE: Free T4: 0.98 ng/dL (ref 0.60–1.60)

## 2021-04-28 LAB — LIPID PANEL
Cholesterol: 106 mg/dL (ref 0–200)
HDL: 40.5 mg/dL (ref >39.00)
LDL Cholesterol: 50 mg/dL (ref 0–99)
NonHDL: 65.11
Total CHOL/HDL Ratio: 3
Triglycerides: 77 mg/dL (ref 0.0–149.0)
VLDL: 15.4 mg/dL (ref 0.0–40.0)

## 2021-04-28 LAB — TSH: TSH: 2.68 u[IU]/mL (ref 0.35–5.50)

## 2021-04-29 ENCOUNTER — Encounter: Payer: Self-pay | Admitting: Family Medicine

## 2021-04-29 ENCOUNTER — Telehealth: Payer: Self-pay

## 2021-04-29 NOTE — Telephone Encounter (Signed)
I have sent this patient through the [employer] portal to be verified by insurance and I left the patient a VM letting her know that I should receive confirmation in a few days and I will give her a call back to make her aware of her OOP cost and schedule the nurse visit

## 2021-05-04 ENCOUNTER — Telehealth: Payer: Self-pay

## 2021-05-04 NOTE — Telephone Encounter (Signed)
I have sent this patient through the portal for insurance verification-due to the insurance requiring a $4000 deductible the best advice would be for her to use co-pay card and to pick up Prolia at her pharmacy and bring it to the office on her scheduled injection day-  Cost at pharmacy with copay card-$25 Cost at check-in- $25 I will call the patient with information on how she can get set up with co-pay card

## 2021-05-09 ENCOUNTER — Ambulatory Visit: Payer: Managed Care, Other (non HMO) | Admitting: Family Medicine

## 2021-05-17 ENCOUNTER — Encounter: Payer: Self-pay | Admitting: Internal Medicine

## 2021-05-17 ENCOUNTER — Ambulatory Visit (INDEPENDENT_AMBULATORY_CARE_PROVIDER_SITE_OTHER): Payer: Managed Care, Other (non HMO) | Admitting: Internal Medicine

## 2021-05-17 VITALS — BP 118/70 | HR 76 | Ht 64.25 in | Wt 152.4 lb

## 2021-05-17 DIAGNOSIS — K219 Gastro-esophageal reflux disease without esophagitis: Secondary | ICD-10-CM

## 2021-05-17 DIAGNOSIS — Z1211 Encounter for screening for malignant neoplasm of colon: Secondary | ICD-10-CM | POA: Diagnosis not present

## 2021-05-17 NOTE — Patient Instructions (Signed)
Will call once we review records from Dr. Earlean Shawl.   If you are age 64 or older, your body mass index should be between 23-30. Your Body mass index is 25.95 kg/m. If this is out of the aforementioned range listed, please consider follow up with your Primary Care Provider.  If you are age 55 or younger, your body mass index should be between 19-25. Your Body mass index is 25.95 kg/m. If this is out of the aformentioned range listed, please consider follow up with your Primary Care Provider.   __________________________________________________________  The [facility] providers would like to encourage you to use [facility] to communicate with providers for non-urgent requests or questions.  Due to long hold times on the telephone, sending your provider a message by [facility] may be a faster and more efficient way to get a response.  Please allow 48 business hours for a response.  Please remember that this is for non-urgent requests.

## 2021-05-17 NOTE — Progress Notes (Signed)
Chief Complaint: GERD  HPI : 65 year old with history of GERD, anxiety, and TIA p/w GERD  She had a TIA in 01/2021 that created a lot of stress for her. As a result, she had exacerbations of her GERD after the TIA. Since then, she has made dietary adjustments including giving up caffeine and alcohol. With these diet changes, she has not had any more symptoms. Also had some irregular bowel movements after her TIA, which have also improved. She is currently on pantoprazole 20 mg QD for GERD. She took the sucralfate and Zofran for a bit, but she stopped these because they weren't really helping her. Last colonoscopy was in 2015 that was reportedly normal. Denies fam hx of colon cancer. Endorses some rectal bleeding infrequently with a scant amount of bleeding. She has longstanding constipation for years, which is unchanged in severity. She has lost about 10 lbs since 01/2021, but she attributes this to her diet changes as well. Denies dysphagia, odynophagia, vomiting.  Wt Readings from Last 3 Encounters:  05/17/21 152 lb 6 oz (69.1 kg)  04/27/21 152 lb (68.9 kg)  04/25/21 150 lb (68 kg)    Past Medical History:  Diagnosis Date   Anxiety    Arthritis    Depression    Gallstones    GERD (gastroesophageal reflux disease)    HLD (hyperlipidemia)    Osteoporosis, unspecified 09/02/2013   Scoliosis 04/18/2019   Stroke [facility])      Past Surgical History:  Procedure Laterality Date   CHOLECYSTECTOMY     TENDON REPAIR Left    Ankle   Family History  Problem Relation Age of Onset   Mental illness Mother    Stroke Mother    Dementia Mother    Alzheimer's disease Mother    Bladder Cancer Father    Healthy Brother    Healthy Brother    Healthy Brother    CAD Maternal Grandmother    CAD Maternal Grandfather    Healthy Son    Healthy Son    Social History   Tobacco Use   Smoking status: Former    Types: Cigarettes    Quit date: 1997    Years since quitting: 25.7   Smokeless  tobacco: Never   Tobacco comments:    1 ppd for 15 years, quit 20 years ago  Vaping Use   Vaping Use: Never used  Substance Use Topics   Alcohol use: Not Currently    Comment: occ    Drug use: Never   Current Outpatient Medications  Medication Sig Dispense Refill   ALPRAZolam (XANAX) 0.5 MG tablet Take 0.5-1 tablets (0.25-0.5 mg total) by mouth daily as needed for anxiety (panic symptoms). 15 tablet 0   aspirin EC 81 MG EC tablet Take 1 tablet (81 mg total) by mouth daily. Swallow whole. 30 tablet 11   atorvastatin (LIPITOR) 40 MG tablet Take 1 tablet (40 mg total) by mouth daily. 30 tablet 1   calcium carbonate (OS-CAL) 600 MG TABS tablet Take 600 mg by mouth 2 (two) times daily with a meal.     cholecalciferol (VITAMIN D) 1000 UNITS tablet Take 1,000 Units by mouth daily.     denosumab (PROLIA) 60 MG/ML SOSY injection Inject 60 mg into the skin every 6 (six) months.     escitalopram (LEXAPRO) 10 MG tablet Take 1 tablet (10 mg total) by mouth daily. 30 tablet 2   pantoprazole (PROTONIX) 20 MG tablet Take 1 tablet (20 mg total) by  mouth daily. 30 tablet 2   ondansetron (ZOFRAN ODT) 4 MG disintegrating tablet Take 1 tablet (4 mg total) by mouth every 8 (eight) hours as needed for nausea or vomiting. (Patient not taking: Reported on 05/17/2021) 5 tablet 0   No current facility-administered medications for this visit.   No Known Allergies  Review of Systems: All systems reviewed and negative except where noted in HPI.   Physical Exam: BP 118/70 (BP Location: Left Arm, Patient Position: Sitting, Cuff Size: Normal)   Pulse 76   Ht 5' 4.25" (1.632 m) Comment: height measured without shoes  Wt 152 lb 6 oz (69.1 kg)   LMP 08/21/2002   BMI 25.95 kg/m  Constitutional: Pleasant,well-developed, female in no acute distress. HEENT: Normocephalic and atraumatic. Conjunctivae are normal. No scleral icterus. Cardiovascular: Normal rate, regular rhythm.  Pulmonary/chest: Effort normal and breath  sounds normal. No wheezing, rales or rhonchi. Abdominal: Soft, nondistended, nontender. Bowel sounds active throughout. There are no masses palpable. No hepatomegaly. Extremities: No edema Neurological: Alert and oriented to person place and time. Skin: Skin is warm and dry. No rashes noted. Psychiatric: Normal mood and affect. Behavior is normal.  DG Chest Port 1 View  Result Date: 04/25/2021 CLINICAL DATA:  Nausea and abdominal pain EXAM: PORTABLE CHEST 1 VIEW COMPARISON:  01/30/2021 FINDINGS: Normal heart size and mediastinal contours. Mild interstitial prominence which is stable. There is no edema, consolidation, effusion, or pneumothorax. S-shaped scoliosis of the thoracolumbar spine. IMPRESSION: Stable exam.  No acute finding. Electronically Signed   By: Monte Fantasia M.D.   On: 04/25/2021 10:17     ASSESSMENT AND PLAN: Colon cancer screening GERD Had multiple GI symptoms after her TIA, which have improved with dietary changes. She is maintained on PPI QD with good control of her GERD. Unclear at this time what her last colonoscopy showed. Thus will attempt to obtain her prior colonoscopy records from Dr. Earlean Shawl (I see she has a telephone note from 2018 suggesting that she was due for 3 year follow up at that time). Pending these records, will determine the timing of the patient's next colonoscopy - Continue PPI QD - Obtain prior colonoscopy records from Dr. Earlean Shawl. Depending on what the records show, will determine the timing of the patient's next colonoscopy  Christia Reading, MD

## 2021-05-18 ENCOUNTER — Other Ambulatory Visit: Payer: Self-pay

## 2021-05-18 ENCOUNTER — Encounter: Payer: Self-pay | Admitting: Family Medicine

## 2021-05-18 MED ORDER — ESCITALOPRAM OXALATE 10 MG PO TABS: 10.0000 mg | ORAL_TABLET | Freq: Every day | ORAL | 2 refills | Status: DC

## 2021-05-18 MED ORDER — ATORVASTATIN CALCIUM 40 MG PO TABS: 40.0000 mg | ORAL_TABLET | Freq: Every day | ORAL | 1 refills | Status: DC

## 2021-05-28 ENCOUNTER — Other Ambulatory Visit: Payer: Self-pay | Admitting: Family Medicine

## 2021-05-31 ENCOUNTER — Encounter: Payer: Self-pay | Admitting: Family Medicine

## 2021-05-31 ENCOUNTER — Other Ambulatory Visit: Payer: Self-pay

## 2021-05-31 ENCOUNTER — Ambulatory Visit (INDEPENDENT_AMBULATORY_CARE_PROVIDER_SITE_OTHER): Payer: Managed Care, Other (non HMO) | Admitting: Family Medicine

## 2021-05-31 VITALS — BP 120/78 | HR 71 | Temp 97.7°F | Ht 64.0 in | Wt 155.6 lb

## 2021-05-31 DIAGNOSIS — M810 Age-related osteoporosis without current pathological fracture: Secondary | ICD-10-CM | POA: Diagnosis not present

## 2021-05-31 DIAGNOSIS — K2900 Acute gastritis without bleeding: Secondary | ICD-10-CM

## 2021-05-31 DIAGNOSIS — F410 Panic disorder [episodic paroxysmal anxiety] without agoraphobia: Secondary | ICD-10-CM | POA: Diagnosis not present

## 2021-05-31 DIAGNOSIS — J069 Acute upper respiratory infection, unspecified: Secondary | ICD-10-CM

## 2021-05-31 DIAGNOSIS — Z23 Encounter for immunization: Secondary | ICD-10-CM

## 2021-05-31 DIAGNOSIS — F4322 Adjustment disorder with anxiety: Secondary | ICD-10-CM | POA: Insufficient documentation

## 2021-05-31 MED ORDER — ATORVASTATIN CALCIUM 40 MG PO TABS: 40.0000 mg | ORAL_TABLET | Freq: Every day | ORAL | 3 refills | Status: DC

## 2021-05-31 MED ORDER — PANTOPRAZOLE SODIUM 20 MG PO TBEC: 20.0000 mg | DELAYED_RELEASE_TABLET | Freq: Every day | ORAL | 1 refills | Status: DC

## 2021-05-31 MED ORDER — ALENDRONATE SODIUM 70 MG PO TABS: 70.0000 mg | ORAL_TABLET | ORAL | 3 refills | Status: DC

## 2021-05-31 NOTE — Telephone Encounter (Signed)
Noted  

## 2021-05-31 NOTE — Telephone Encounter (Signed)
Patient is calling back about her prolia injection.

## 2021-05-31 NOTE — Patient Instructions (Signed)
Please return in January for your annual complete physical; please come fasting.   Today you were given your flu vaccination.    Start the fosamax once weekly again and let me know if you have any problems. Stay on the protonix and lexapro.   If you have any questions or concerns, please don't hesitate to send me a message via MyChart or call the office at [phone]. Thank you for visiting with Taylor Buchanan today! It's our pleasure caring for you.

## 2021-05-31 NOTE — Progress Notes (Signed)
Subjective  CC:  Chief Complaint  Patient presents with   Panic Disorder    Doing much better    HPI: Taylor Buchanan is a 65 y.o. female who presents to the office today to address the problems listed above in the chief complaint, mood problems. See last note; on lexapro 10 for 6 weeks and feeling much much better. No more panic sxs. Less worry. Happier. No Aes. Only needed xanax twice initially. No longer needing. no C/o 2 days of mild dry cough, early uri. No f/c/s or sob.  No st. Would like to get flu shot today if ok. No gi sxs Osteoporosis: had been on prolia: last inection 03/2020: had to stop due to insurance coverage issues/costs: apparently has a high deductible plan. Has been on fosamax and boniva in past; mild nausea for about 24 hours after taking pill but no CI Gastritis: reviewed recent GI notes. Doing well now on protonix alone. No further chest pain/GI sxs.   Depression screen [facility] 2/9 03/15/2021 04/18/2019  Decreased Interest 0 0  Down, Depressed, Hopeless 0 0  PHQ - 2 Score 0 0   GAD 7 : Generalized Anxiety Score 04/27/2021  Nervous, Anxious, on Edge 3  Control/stop worrying 3  Worry too much - different things 3  Trouble relaxing 3  Restless 0  Easily annoyed or irritable 1  Afraid - awful might happen 3  Total GAD 7 Score 16  Anxiety Difficulty Extremely difficult     Assessment  1. Panic disorder   2. Osteoporosis without current pathological fracture, unspecified osteoporosis type   3. Other acute gastritis without hemorrhage   4. Need for immunization against influenza   5. Viral upper respiratory tract infection      Plan  Panic disorder:  much improved. Counseling given. Contiue lexapro 10 - reassess in 6-12 months.  URI, viral but monitor for worsening sxs.  Flu shot today Osteoporosis: restart fosamax after discussion of treatment options discussed.  Reviewed concept of mood problems caused by biochemical imbalance of neurotransmitters and  rationale for treatment with medications and therapy.  Counseling given: pt was instructed to contact office, on-call physician or crisis Hotline if symptoms worsen significantly. If patient develops any suicidal or homicidal thoughts, she is directed to the ER immediately.   Follow up: jan for cpe  Orders Placed This Encounter  Procedures   Flu Vaccine QUAD 55mo+IM (Fluarix, Fluzone & Alfiuria Quad PF)   Meds ordered this encounter  Medications   alendronate (FOSAMAX) 70 MG tablet    Sig: Take 1 tablet (70 mg total) by mouth once a week. Take with a full glass of water on an empty stomach and sit upright for 30 minutes.    Dispense:  12 tablet    Refill:  3   pantoprazole (PROTONIX) 20 MG tablet    Sig: Take 1 tablet (20 mg total) by mouth daily.    Dispense:  90 tablet    Refill:  1   atorvastatin (LIPITOR) 40 MG tablet    Sig: Take 1 tablet (40 mg total) by mouth at bedtime.    Dispense:  90 tablet    Refill:  3      I reviewed the patients updated PMH, FH, and SocHx.    Patient Active Problem List   Diagnosis Date Noted   Panic disorder 05/31/2021   Mixed hyperlipidemia 02/14/2021   Abnormal TSH 02/14/2021   TIA (transient ischemic attack) 01/29/2021   Scoliosis 04/18/2019  Osteoporosis 09/02/2013   Current Meds  Medication Sig   alendronate (FOSAMAX) 70 MG tablet Take 1 tablet (70 mg total) by mouth once a week. Take with a full glass of water on an empty stomach and sit upright for 30 minutes.   ALPRAZolam (XANAX) 0.5 MG tablet Take 0.5-1 tablets (0.25-0.5 mg total) by mouth daily as needed for anxiety (panic symptoms).   aspirin EC 81 MG EC tablet Take 1 tablet (81 mg total) by mouth daily. Swallow whole.   calcium carbonate (OS-CAL) 600 MG TABS tablet Take 600 mg by mouth 2 (two) times daily with a meal.   cholecalciferol (VITAMIN D) 1000 UNITS tablet Take 1,000 Units by mouth daily.   escitalopram (LEXAPRO) 10 MG tablet Take 1 tablet (10 mg total) by mouth daily.    [DISCONTINUED] atorvastatin (LIPITOR) 40 MG tablet TAKE 1 TABLET(40 MG) BY MOUTH DAILY   [DISCONTINUED] denosumab (PROLIA) 60 MG/ML SOSY injection Inject 60 mg into the skin every 6 (six) months.   [DISCONTINUED] pantoprazole (PROTONIX) 20 MG tablet Take 1 tablet (20 mg total) by mouth daily.    Allergies: Patient has No Known Allergies. Family history:  Patient family history includes Alzheimer's disease in her mother; Bladder Cancer in her father; CAD in her maternal grandfather and maternal grandmother; Dementia in her mother; Healthy in her brother, brother, brother, son, and son; Mental illness in her mother; Stroke in her mother. Social History   Socioeconomic History   Marital status: Married    Spouse name: Not on file   Number of children: 2   Years of education: Not on file   Highest education level: Not on file  Occupational History    Employer: [employer]    Comment: retired   Occupation: retired  Tobacco Use   Smoking status: Former    Types: Cigarettes    Quit date: 1997    Years since quitting: 25.7   Smokeless tobacco: Never   Tobacco comments:    1 ppd for 15 years, quit 20 years ago  Vaping Use   Vaping Use: Never used  Substance and Sexual Activity   Alcohol use: Not Currently    Comment: occ    Drug use: Never   Sexual activity: Yes    Birth control/protection: Post-menopausal  Other Topics Concern   Not on file  Social History Narrative   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      Spiritual Beliefs: Christian      Lifestyle: no regular exercise; diet good            Social Determinants of Radio broadcast assistant Strain: Not on file  Food Insecurity: Not on file  Transportation Needs: Not on file  Physical Activity: Not on file  Stress: Not on file  Social Connections: Not on file     Review of Systems: Constitutional: Negative for fever malaise or anorexia Cardiovascular:  negative for chest pain Respiratory: negative for SOB or persistent cough Gastrointestinal: negative for abdominal pain  Objective  Vitals: BP 120/78   Pulse 71   Temp 97.7 F (36.5 C) (Temporal)   Ht 5\' 4"  (1.626 m)   Wt 155 lb 9.6 oz (70.6 kg)   LMP 08/21/2002   SpO2 98%   BMI 26.71 kg/m  General: no acute distress, well appearing, no apparent distress, well groomed Psych:  Alert and oriented x 3,normal mood, behavior, speech, dress, and thought  processes.  Cardiovascular:  RRR without murmur or gallop. no peripheral edema Respiratory:  Good breath sounds bilaterally, CTAB with normal respiratory effort Skin:  Warm, no rashes   Commons side effects, risks, benefits, and alternatives for medications and treatment plan prescribed today were discussed, and the patient expressed understanding of the given instructions. Patient is instructed to call or message via MyChart if he/she has any questions or concerns regarding our treatment plan. No barriers to understanding were identified. We discussed Red Flag symptoms and signs in detail. Patient expressed understanding regarding what to do in case of urgent or emergency type symptoms.  Medication list was reconciled, printed and provided to the patient in AVS. Patient instructions and summary information was reviewed with the patient as documented in the AVS. This note was prepared with assistance of Dragon voice recognition software. Occasional wrong-word or sound-a-like substitutions may have occurred due to the inherent limitations of voice recognition software

## 2021-06-02 ENCOUNTER — Ambulatory Visit: Payer: Managed Care, Other (non HMO) | Admitting: Family Medicine

## 2021-06-09 ENCOUNTER — Encounter: Payer: Self-pay | Admitting: Internal Medicine

## 2021-06-09 NOTE — Progress Notes (Signed)
Received report of her last colonoscopy with Dr. Earlean Shawl.   Colonoscopy 08/01/12: Excellent prep. No polyp seen. Normal colonoscopy. Recommended 5-10 year follow up.  Okay for her next colonoscopy to be done in 07/2022. Called the patient to let her know  Ammie, please place a recall for her to get a colonoscopy at that time. Thanks!

## 2021-06-09 NOTE — Progress Notes (Signed)
Recall placed per Dr. Libby Maw recommendation.

## 2021-06-23 NOTE — Telephone Encounter (Signed)
Patient is taking Fosamax,no longer interested in [place]

## 2021-07-29 ENCOUNTER — Other Ambulatory Visit: Payer: Self-pay | Admitting: Family Medicine

## 2021-07-29 ENCOUNTER — Encounter (HOSPITAL_BASED_OUTPATIENT_CLINIC_OR_DEPARTMENT_OTHER): Payer: Self-pay | Admitting: Obstetrics and Gynecology

## 2021-07-30 NOTE — H&P (Signed)
Taylor Buchanan is an 65 y.o. female here for scheduled for a cold knife cone biopsy on 08/04/21.   Last pap 04/2021 showed ASCUS with +HRHPV. She had neg pap and hpv in 2018 and neg pap only in 2014  She recently had an endometrial biopsy done due to 2 episodes of post menopausal bleeding since 07/2020. U/S each time showed a thin endometrial lining however results from biopsy noted fragments of sqamous mucosa with high grade dysplasia   Pt had a TIA this summer - was hospitalized.   Pertinent Gynecological History: Menses: post-menopausal Bleeding: post menopausal bleeding Contraception: none DES exposure: denies Blood transfusions: none Sexually transmitted diseases: no past history Previous GYN Procedures:  n/a   Last mammogram: normal Date: 2022  Menstrual History: Menarche age: n/a Patient's last menstrual period was 08/21/2002.    Past Medical History:  Diagnosis Date   Anxiety    Arthritis    Depression    GERD (gastroesophageal reflux disease)    HLD (hyperlipidemia)    Osteoporosis, unspecified 09/02/2013   Scoliosis 04/18/2019   Stroke [facility])     Past Surgical History:  Procedure Laterality Date   CHOLECYSTECTOMY     TENDON REPAIR Left    Ankle    Family History  Problem Relation Age of Onset   Mental illness Mother    Stroke Mother    Dementia Mother    Alzheimer's disease Mother    Bladder Cancer Father    Healthy Brother    Healthy Brother    Healthy Brother    CAD Maternal Grandmother    CAD Maternal Grandfather    Healthy Son    Healthy Son     Social History:  reports that she quit smoking about 25 years ago. Her smoking use included cigarettes. She has never used smokeless tobacco. She reports that she does not currently use alcohol. She reports that she does not use drugs.  Allergies: No Known Allergies  No medications prior to admission.    Review of Systems  Constitutional:  Negative for fatigue.  Eyes:  Negative for photophobia and  visual disturbance.  Respiratory:  Negative for chest tightness and shortness of breath.   Cardiovascular:  Negative for chest pain, palpitations and leg swelling.  Gastrointestinal:  Negative for abdominal pain.  Genitourinary:  Negative for pelvic pain.  Psychiatric/Behavioral:  The patient is nervous/anxious.   All other systems reviewed and are negative.  Last menstrual period 08/21/2002. Physical Exam Vitals and nursing note reviewed. Exam conducted with a chaperone present.  Constitutional:      Appearance: Normal appearance. She is normal weight.  Pulmonary:     Effort: Pulmonary effort is normal.  Abdominal:     General: Abdomen is flat.  Musculoskeletal:        General: Normal range of motion.     Cervical back: Normal range of motion.  Skin:    General: Skin is warm.     Capillary Refill: Capillary refill takes 2 to 3 seconds.  Neurological:     General: No focal deficit present.     Mental Status: She is alert and oriented to person, place, and time. Mental status is at baseline.  Psychiatric:        Mood and Affect: Mood normal.        Behavior: Behavior normal.        Thought Content: Thought content normal.    No results found for this or any previous visit (from the past 24  hour(s)).  No results found.  Assessment/Plan: 65yo with abnormal pap ( ASCUS), postmenopausal bleed and abnormal endometrial biopsy here for [facility] - Admit - Consent to be verified - ERAS protocol  - To OR when ready   Venetia Night Syretta Kochel 07/30/2021, 4:56 AM

## 2021-08-01 ENCOUNTER — Encounter (HOSPITAL_BASED_OUTPATIENT_CLINIC_OR_DEPARTMENT_OTHER): Payer: Self-pay | Admitting: Obstetrics and Gynecology

## 2021-08-01 ENCOUNTER — Other Ambulatory Visit: Payer: Self-pay

## 2021-08-01 NOTE — Progress Notes (Signed)
Spoke w/ via phone for pre-op interview--- pt Lab needs dos----   cbc, t&s            Lab results------ no COVID test -----patient states asymptomatic no test needed Arrive at ------- 0745 on 08-04-2021 NPO after MN NO Solid Food.  Clear liquids from MN until--- 0645 Med rec completed Medications to take morning of surgery ----- lexapro, protonix, lipitor Diabetic medication ----- n/a Patient instructed no nail polish to be worn day of surgery Patient instructed to bring photo id and insurance card day of surgery Patient aware to have Driver (ride ) / caregiver for 24 hours after surgery -- husband, dean Patient Special Instructions ----- n/a Pre-Op special Istructions ----- n/a Patient verbalized understanding of instructions that were given at this phone interview. Patient denies shortness of breath, chest pain, fever, cough at this phone interview.

## 2021-08-04 ENCOUNTER — Ambulatory Visit (HOSPITAL_BASED_OUTPATIENT_CLINIC_OR_DEPARTMENT_OTHER)
Admission: RE | Admit: 2021-08-04 | Discharge: 2021-08-04 | Disposition: A | Discharge status: Home / Self Care | Payer: Managed Care, Other (non HMO) | Source: Ambulatory Visit | Attending: Obstetrics and Gynecology | Admitting: Obstetrics and Gynecology

## 2021-08-04 ENCOUNTER — Encounter (HOSPITAL_BASED_OUTPATIENT_CLINIC_OR_DEPARTMENT_OTHER)
Admission: RE | Disposition: A | Discharge status: Home / Self Care | Payer: Self-pay | Source: Ambulatory Visit | Attending: Obstetrics and Gynecology

## 2021-08-04 ENCOUNTER — Other Ambulatory Visit: Payer: Self-pay

## 2021-08-04 ENCOUNTER — Encounter (HOSPITAL_BASED_OUTPATIENT_CLINIC_OR_DEPARTMENT_OTHER): Payer: Self-pay | Admitting: Obstetrics and Gynecology

## 2021-08-04 ENCOUNTER — Ambulatory Visit (HOSPITAL_BASED_OUTPATIENT_CLINIC_OR_DEPARTMENT_OTHER): Payer: Managed Care, Other (non HMO) | Admitting: Anesthesiology

## 2021-08-04 DIAGNOSIS — N950 Postmenopausal bleeding: Secondary | ICD-10-CM | POA: Insufficient documentation

## 2021-08-04 DIAGNOSIS — Z8673 Personal history of transient ischemic attack (TIA), and cerebral infarction without residual deficits: Secondary | ICD-10-CM | POA: Insufficient documentation

## 2021-08-04 DIAGNOSIS — C539 Malignant neoplasm of cervix uteri, unspecified: Secondary | ICD-10-CM | POA: Diagnosis not present

## 2021-08-04 DIAGNOSIS — R87810 Cervical high risk human papillomavirus (HPV) DNA test positive: Secondary | ICD-10-CM | POA: Insufficient documentation

## 2021-08-04 DIAGNOSIS — R87610 Atypical squamous cells of undetermined significance on cytologic smear of cervix (ASC-US): Secondary | ICD-10-CM

## 2021-08-04 DIAGNOSIS — N870 Mild cervical dysplasia: Secondary | ICD-10-CM | POA: Diagnosis present

## 2021-08-04 HISTORY — DX: Generalized anxiety disorder: F41.1

## 2021-08-04 HISTORY — DX: Unspecified abnormal cytological findings in specimens from cervix uteri: R87.619

## 2021-08-04 HISTORY — DX: Personal history of other diseases of the nervous system and sense organs: Z86.69

## 2021-08-04 HISTORY — DX: Age-related osteoporosis without current pathological fracture: M81.0

## 2021-08-04 HISTORY — DX: Pain, unspecified: R52

## 2021-08-04 HISTORY — PX: CERVICAL CONIZATION W/BX: SHX1330

## 2021-08-04 LAB — CBC
HCT: 42.5 % (ref 36.0–46.0)
Hemoglobin: 13.8 g/dL (ref 12.0–15.0)
MCH: 31.9 pg (ref 26.0–34.0)
MCHC: 32.5 g/dL (ref 30.0–36.0)
MCV: 98.2 fL (ref 80.0–100.0)
Platelets: 301 10*3/uL (ref 150–400)
RBC: 4.33 MIL/uL (ref 3.87–5.11)
RDW: 13.7 % (ref 11.5–15.5)
WBC: 7.3 10*3/uL (ref 4.0–10.5)
nRBC: 0 % (ref 0.0–0.2)

## 2021-08-04 LAB — TYPE AND SCREEN
ABO/RH(D): AB NEG
Antibody Screen: NEGATIVE

## 2021-08-04 LAB — ABO/RH: ABO/RH(D): AB NEG

## 2021-08-04 SURGERY — CONE BIOPSY, CERVIX
Anesthesia: General | Site: Vagina

## 2021-08-04 MED ORDER — PROPOFOL 10 MG/ML IV BOLUS
INTRAVENOUS | Status: DC | PRN
  Administered 2021-08-04: 150 mg via INTRAVENOUS
  Administered 2021-08-04: 50 mg via INTRAVENOUS

## 2021-08-04 MED ORDER — ACETAMINOPHEN 500 MG PO TABS
ORAL_TABLET | ORAL | Status: AC
  Filled 2021-08-04: qty 2

## 2021-08-04 MED ORDER — AMISULPRIDE (ANTIEMETIC) 5 MG/2ML IV SOLN: 10.0000 mg | Freq: Once | INTRAVENOUS | Status: DC | PRN

## 2021-08-04 MED ORDER — OXYCODONE HCL 5 MG/5ML PO SOLN: 5.0000 mg | Freq: Once | ORAL | Status: DC | PRN

## 2021-08-04 MED ORDER — KETOROLAC TROMETHAMINE 30 MG/ML IJ SOLN
INTRAMUSCULAR | Status: AC
  Filled 2021-08-04: qty 1

## 2021-08-04 MED ORDER — LIDOCAINE 2% (20 MG/ML) 5 ML SYRINGE
INTRAMUSCULAR | Status: AC
  Filled 2021-08-04: qty 5

## 2021-08-04 MED ORDER — LIDOCAINE 2% (20 MG/ML) 5 ML SYRINGE
INTRAMUSCULAR | Status: DC | PRN
  Administered 2021-08-04: 60 mg via INTRAVENOUS

## 2021-08-04 MED ORDER — FENTANYL CITRATE (PF) 100 MCG/2ML IJ SOLN: 25.0000 ug | INTRAMUSCULAR | Status: DC | PRN

## 2021-08-04 MED ORDER — LACTATED RINGERS IV SOLN: INTRAVENOUS | Status: DC

## 2021-08-04 MED ORDER — MIDAZOLAM HCL 2 MG/2ML IJ SOLN
INTRAMUSCULAR | Status: AC
  Filled 2021-08-04: qty 2

## 2021-08-04 MED ORDER — KETOROLAC TROMETHAMINE 30 MG/ML IJ SOLN
INTRAMUSCULAR | Status: DC | PRN
  Administered 2021-08-04: 30 mg via INTRAVENOUS

## 2021-08-04 MED ORDER — EPHEDRINE SULFATE 50 MG/ML IJ SOLN
INTRAMUSCULAR | Status: DC | PRN
  Administered 2021-08-04: 20 mg via INTRAVENOUS

## 2021-08-04 MED ORDER — DEXAMETHASONE SODIUM PHOSPHATE 4 MG/ML IJ SOLN
INTRAMUSCULAR | Status: DC | PRN
  Administered 2021-08-04: 5 mg via INTRAVENOUS

## 2021-08-04 MED ORDER — ONDANSETRON HCL 4 MG/2ML IJ SOLN
INTRAMUSCULAR | Status: DC | PRN
  Administered 2021-08-04: 4 mg via INTRAVENOUS

## 2021-08-04 MED ORDER — DEXAMETHASONE SODIUM PHOSPHATE 10 MG/ML IJ SOLN
INTRAMUSCULAR | Status: AC
  Filled 2021-08-04: qty 1

## 2021-08-04 MED ORDER — ACETAMINOPHEN 500 MG PO TABS
1000.0000 mg | ORAL_TABLET | ORAL | Status: AC
  Administered 2021-08-04: 1000 mg via ORAL

## 2021-08-04 MED ORDER — POVIDONE-IODINE 10 % EX SWAB: 2.0000 "application " | Freq: Once | CUTANEOUS | Status: DC

## 2021-08-04 MED ORDER — ONDANSETRON HCL 4 MG/2ML IJ SOLN: 4.0000 mg | Freq: Once | INTRAMUSCULAR | Status: DC | PRN

## 2021-08-04 MED ORDER — PROPOFOL 10 MG/ML IV BOLUS
INTRAVENOUS | Status: AC
  Filled 2021-08-04: qty 20

## 2021-08-04 MED ORDER — FENTANYL CITRATE (PF) 100 MCG/2ML IJ SOLN
INTRAMUSCULAR | Status: AC
  Filled 2021-08-04: qty 2

## 2021-08-04 MED ORDER — 0.9 % SODIUM CHLORIDE (POUR BTL) OPTIME
TOPICAL | Status: DC | PRN
  Administered 2021-08-04: 500 mL

## 2021-08-04 MED ORDER — OXYCODONE-ACETAMINOPHEN 5-325 MG PO TABS: 1.0000 | ORAL_TABLET | ORAL | 0 refills | Status: AC | PRN

## 2021-08-04 MED ORDER — OXYCODONE HCL 5 MG PO TABS: 5.0000 mg | ORAL_TABLET | Freq: Once | ORAL | Status: DC | PRN

## 2021-08-04 MED ORDER — EPHEDRINE 5 MG/ML INJ
INTRAVENOUS | Status: AC
  Filled 2021-08-04: qty 5

## 2021-08-04 MED ORDER — MIDAZOLAM HCL 5 MG/5ML IJ SOLN
INTRAMUSCULAR | Status: DC | PRN
  Administered 2021-08-04: 2 mg via INTRAVENOUS

## 2021-08-04 MED ORDER — ONDANSETRON HCL 4 MG/2ML IJ SOLN
INTRAMUSCULAR | Status: AC
  Filled 2021-08-04: qty 2

## 2021-08-04 MED ORDER — IBUPROFEN 600 MG PO TABS: 600.0000 mg | ORAL_TABLET | Freq: Four times a day (QID) | ORAL | 0 refills | Status: DC | PRN

## 2021-08-04 MED ORDER — LIDOCAINE-EPINEPHRINE 1 %-1:100000 IJ SOLN
INTRAMUSCULAR | Status: DC | PRN
  Administered 2021-08-04: 10 mL

## 2021-08-04 MED ORDER — FERRIC SUBSULFATE SOLN
Status: DC | PRN
  Administered 2021-08-04: 1

## 2021-08-04 MED ORDER — KETOROLAC TROMETHAMINE 30 MG/ML IJ SOLN: 30.0000 mg | Freq: Once | INTRAMUSCULAR | Status: DC

## 2021-08-04 MED ORDER — FENTANYL CITRATE (PF) 100 MCG/2ML IJ SOLN
INTRAMUSCULAR | Status: DC | PRN
  Administered 2021-08-04: 25 ug via INTRAVENOUS
  Administered 2021-08-04: 50 ug via INTRAVENOUS
  Administered 2021-08-04 (×2): 25 ug via INTRAVENOUS
  Administered 2021-08-04: 50 ug via INTRAVENOUS

## 2021-08-04 SURGICAL SUPPLY — 26 items
APL SWBSTK 6 STRL LF DISP (MISCELLANEOUS) ×1
APPLICATOR COTTON TIP 6 STRL (MISCELLANEOUS) ×1 IMPLANT
APPLICATOR COTTON TIP 6IN STRL (MISCELLANEOUS) ×2
BLADE SURG 11 STRL SS (BLADE) ×2 IMPLANT
CATH ROBINSON RED A/P 16FR (CATHETERS) ×2 IMPLANT
CNTNR URN SCR LID CUP LEK RST (MISCELLANEOUS) ×2 IMPLANT
CONT SPEC 4OZ STRL OR WHT (MISCELLANEOUS)
ELECT BALL LEEP 5MM RED (ELECTRODE) ×1 IMPLANT
ELECT BLADE TIP CTD 4 INCH (ELECTRODE) ×1 IMPLANT
ELECT LOOP LEEP RND 15X12 GRN (CUTTING LOOP)
ELECT LOOP LEEP RND 20X12 WHT (CUTTING LOOP)
ELECTRODE LOOP LP RND 15X12GRN (CUTTING LOOP) IMPLANT
ELECTRODE LOOP LP RND 20X12WHT (CUTTING LOOP) IMPLANT
EXTENDER ELECT LOOP LEEP 10CM (CUTTING LOOP) IMPLANT
GAUZE 4X4 16PLY ~~LOC~~+RFID DBL (SPONGE) ×2 IMPLANT
GLOVE SURG ENC MOIS LTX SZ6.5 (GLOVE) ×2 IMPLANT
GOWN STRL REUS W/TWL LRG LVL3 (GOWN DISPOSABLE) ×4 IMPLANT
KIT TURNOVER CYSTO (KITS) ×2 IMPLANT
NS IRRIG 1000ML POUR BTL (IV SOLUTION) ×1 IMPLANT
NS IRRIG 500ML POUR BTL (IV SOLUTION) ×1 IMPLANT
PACK VAGINAL WOMENS (CUSTOM PROCEDURE TRAY) ×2 IMPLANT
PAD OB MATERNITY 4.3X12.25 (PERSONAL CARE ITEMS) ×2 IMPLANT
PENCIL SMOKE EVACUATOR (MISCELLANEOUS) ×1 IMPLANT
SCOPETTES 8  STERILE (MISCELLANEOUS) ×2
SCOPETTES 8 STERILE (MISCELLANEOUS) ×2 IMPLANT
TOWEL OR 17X26 10 PK STRL BLUE (TOWEL DISPOSABLE) ×3 IMPLANT

## 2021-08-04 NOTE — Anesthesia Postprocedure Evaluation (Signed)
Anesthesia Post Note  Patient: Taylor Buchanan  Procedure(s) Performed: CONIZATION CERVIX WITH BIOPSY: DILATION AND CURETTAGE OF UTERUS (Vagina )     Patient location during evaluation: PACU Anesthesia Type: General Level of consciousness: awake and alert Pain management: pain level controlled Vital Signs Assessment: post-procedure vital signs reviewed and stable Respiratory status: spontaneous breathing, nonlabored ventilation and respiratory function stable Cardiovascular status: blood pressure returned to baseline and stable Postop Assessment: no apparent nausea or vomiting Anesthetic complications: no   No notable events documented.  Last Vitals:  Vitals:   08/04/21 1125 08/04/21 1158  BP: 134/80 120/78  Pulse: 78 66  Resp: 17 15  Temp:  36.4 C  SpO2: 95% 97%    Last Pain:  Vitals:   08/04/21 1158  TempSrc:   PainSc: 0-No pain                 Lidia Collum

## 2021-08-04 NOTE — Transfer of Care (Signed)
Immediate Anesthesia Transfer of Care Note  Patient: Taylor Buchanan  Procedure(s) Performed: Procedure(s) (LRB): CONIZATION CERVIX WITH BIOPSY: DILATION AND CURETTAGE OF UTERUS (N/A)  Patient Location: PACU  Anesthesia Type: General  Level of Consciousness: awake, sedated, patient cooperative and responds to stimulation  Airway & Oxygen Therapy: Patient Spontanous Breathing and Patient connected to [place] 02 and soft FM  Post-op Assessment: Report given to PACU RN, Post -op Vital signs reviewed and stable and Patient moving all extremities  Post vital signs: Reviewed and stable  Complications: No apparent anesthesia complications

## 2021-08-04 NOTE — Anesthesia Procedure Notes (Signed)
Procedure Name: LMA Insertion Date/Time: 08/04/2021 10:03 AM Performed by: Justice Rocher, CRNA Pre-anesthesia Checklist: Patient identified, Emergency Drugs available, Suction available, Patient being monitored and Timeout performed Patient Re-evaluated:Patient Re-evaluated prior to induction Oxygen Delivery Method: Circle system utilized Preoxygenation: Pre-oxygenation with 100% oxygen Induction Type: IV induction Ventilation: Mask ventilation without difficulty LMA: LMA inserted LMA Size: 4.0 Number of attempts: 1 Airway Equipment and Method: Bite block Placement Confirmation: positive ETCO2, breath sounds checked- equal and bilateral and CO2 detector Tube secured with: Tape Dental Injury: Teeth and Oropharynx as per pre-operative assessment

## 2021-08-04 NOTE — Anesthesia Preprocedure Evaluation (Signed)
Anesthesia Evaluation  Patient identified by MRN, date of birth, ID band Patient awake    Reviewed: Allergy & Precautions, NPO status , Patient's Chart, lab work & pertinent test results  History of Anesthesia Complications Negative for: history of anesthetic complications  Airway Mallampati: I  TM Distance: >3 FB Neck ROM: Full    Dental  (+) Dental Advisory Given, Teeth Intact   Pulmonary neg pulmonary ROS, former smoker,    Pulmonary exam normal        Cardiovascular Normal cardiovascular exam  HLD  Echo 01/31/21: EF 60-65%, no RWMA, mild LVH, nl RVSF, mild MR   Neuro/Psych  Headaches, Anxiety Depression Hospitalized June 2022 for TIA-like symptoms but ruled unlikely to be TIA by neurology after workup    GI/Hepatic Neg liver ROS, GERD  ,  Endo/Other  negative endocrine ROS  Renal/GU negative Renal ROS  negative genitourinary   Musculoskeletal  (+) Arthritis ,   Abdominal   Peds  Hematology negative hematology ROS (+)   Anesthesia Other Findings   Reproductive/Obstetrics ASCUS on pap smear                            Anesthesia Physical Anesthesia Plan  ASA: 2  Anesthesia Plan: General   Post-op Pain Management: Toradol IV (intra-op) and Tylenol PO (pre-op)   Induction: Intravenous  PONV Risk Score and Plan: 3 and Ondansetron, Dexamethasone, Midazolam and Treatment may vary due to age or medical condition  Airway Management Planned: LMA  Additional Equipment: None  Intra-op Plan:   Post-operative Plan: Extubation in OR  Informed Consent: I have reviewed the patients History and Physical, chart, labs and discussed the procedure including the risks, benefits and alternatives for the proposed anesthesia with the patient or authorized representative who has indicated his/her understanding and acceptance.     Dental advisory given  Plan Discussed with:   Anesthesia Plan  Comments:         Anesthesia Quick Evaluation

## 2021-08-04 NOTE — Discharge Instructions (Addendum)
°  Post Anesthesia Home Care Instructions  Activity: Get plenty of rest for the remainder of the day. A responsible adult should stay with you for 24 hours following the procedure.  For the next 24 hours, DO NOT: -Drive a car -Paediatric nurse -Drink alcoholic beverages -Take any medication unless instructed by your physician -Make any legal decisions or sign important papers.  Meals: Start with liquid foods such as gelatin or soup. Progress to regular foods as tolerated. Avoid greasy, spicy, heavy foods. If nausea and/or vomiting occur, drink only clear liquids until the nausea and/or vomiting subsides. Call your physician if vomiting continues.  Special Instructions/Symptoms: Your throat may feel dry or sore from the anesthesia or the breathing tube placed in your throat during surgery. If this causes discomfort, gargle with warm salt water. The discomfort should disappear within 24 hours.  If you had a scopolamine patch placed behind your ear for the management of post- operative nausea and/or vomiting:  1. The medication in the patch is effective for 72 hours, after which it should be removed.  Wrap patch in a tissue and discard in the trash. Wash hands thoroughly with soap and water. 2. You may remove the patch earlier than 72 hours if you experience unpleasant side effects which may include dry mouth, dizziness or visual disturbances. 3. Avoid touching the patch. Wash your hands with soap and water after contact with the patch.     D [facility] Home care Instructions:   Personal hygiene:  Used sanitary napkins for vaginal drainage not tampons. Shower or tub bathe the day after your procedure. No douching until bleeding stops. Always wipe from front to back after  Elimination.  Activity: Do not drive or operate any equipment today. The effects of the anesthesia are still present and drowsiness may result. Rest today, not necessarily flat bed rest, just take it easy. You may resume your  normal activity in one to 2 days.  Sexual activity: No intercourse for one week or as indicated by your physician  Diet: Eat a light diet as desired this evening. You may resume a regular diet tomorrow.  Return to work: One to 2 days.  General Expectations of your surgery: Vaginal bleeding should be no heavier than a normal period. Spotting may continue up to 10 days. Mild cramps may continue for a couple of days. You may have a regular period in 2-6 weeks.  Unexpected observations call your doctor if these occur: persistent or heavy bleeding. Severe abdominal cramping or pain. Elevation of temperature greater than 100F.  Keep appointment on 08/17/2021  Call with any concerns prior     Patient's Signature_______________________________________________________  Nurse's Signature________________________________________________________     No acetaminophen/Tylenol until after 2:30pm today if needed for pain.    No ibuprofen, Advil, Aleve, Motrin, ketorolac, meloxicam, naproxen, or other NSAIDS until after 4:48pm today if needed for pain.

## 2021-08-04 NOTE — Op Note (Signed)
Operative Note    Preoperative Diagnosis CIN 1 ( ASCUS with +HRHPV on pap ) Postmenopausal bleeding  High grade dysplasia on endometrial biopsy    Postoperative Diagnosis: Same   Procedure: Cold Knife cone biopsy and dilation and curettage of uterine cavity   Surgeon: Terri Piedra C DO   Anesthesia:General   Fluids: LR 1L EBL: 35ml UOP: 43ml   Findings: Short cervix 2cm length, Grossly normal appearance,Small amount of endometrial tissue evacuation   Specimen: Endometrial currettings and portion of cervix (cone)   Procedure Note  Pt was taken to the operating room. She was administered general anesthesia and once adequate, placed in the dorsal lithotomy position.  A speculum was placed vaginally and the anterior lip of the cervix identified and grasped with a single toothed tenaculum. 10cc of 1% lidocaine with epinephrine was injected at 3 and 9 oclock fo the cervix for further anesthesia. Next the uterus was sounded to 7cm. The os was dilated to accommodate a curette. A sharp curettage was then performed with adequate sampling of the uterus. A small amount of tissue products were noted. Next, the cervix was examined. Acetic acid applied and mild acetowhite changes observed.  Using an 11 blade, a cone wedge was excised. Hemostasis was achieved with cautery. The sample of the cervix was marked and sent to pathology for evaluation. A ball catery was then used to effect further hemostasis on the excised bed of the cervix. A liberal amount of monsels was also applied. Great hemostasis was appreciated.  At this time, all instruments were removed from the vagina. Sponge and lap counts were noted to be correct x 2.  Pt was awakened and taken to the recovery room in stable condition.

## 2021-08-04 NOTE — Interval H&P Note (Signed)
History and Physical Interval Note: Pt seen and procedure reviewed Plan to perform a cold knife conization of the cervix with dilation and curettage of the uterus.  Risks/benefits and expectations reviewed and pt confirms consent to procedure To OR when ready   08/04/2021 9:20 AM  Taylor Buchanan  has presented today for surgery, with the diagnosis of abnormal cervical pap.  The various methods of treatment have been discussed with the patient and family. After consideration of risks, benefits and other options for treatment, the patient has consented to  Procedure(s): CONIZATION CERVIX WITH BIOPSY (N/A) as a surgical intervention.  The patient's history has been reviewed, patient examined, no change in status, stable for surgery.  I have reviewed the patient's chart and labs.  Questions were answered to the patient's satisfaction.     Isaiah Serge

## 2021-08-05 ENCOUNTER — Encounter (HOSPITAL_BASED_OUTPATIENT_CLINIC_OR_DEPARTMENT_OTHER): Payer: Self-pay | Admitting: Obstetrics and Gynecology

## 2021-08-08 ENCOUNTER — Other Ambulatory Visit (HOSPITAL_COMMUNITY): Payer: Self-pay | Admitting: Obstetrics and Gynecology

## 2021-08-08 DIAGNOSIS — C539 Malignant neoplasm of cervix uteri, unspecified: Secondary | ICD-10-CM

## 2021-08-09 ENCOUNTER — Encounter: Payer: Self-pay | Admitting: Family Medicine

## 2021-08-09 MED ORDER — ALPRAZOLAM 0.5 MG PO TABS: 0.2500 mg | ORAL_TABLET | Freq: Every day | ORAL | 0 refills | Status: DC | PRN

## 2021-08-10 ENCOUNTER — Telehealth: Payer: Self-pay | Admitting: *Deleted

## 2021-08-10 ENCOUNTER — Other Ambulatory Visit: Payer: Self-pay | Admitting: Neurosurgery

## 2021-08-10 ENCOUNTER — Other Ambulatory Visit (HOSPITAL_COMMUNITY): Payer: Self-pay | Admitting: Obstetrics and Gynecology

## 2021-08-10 DIAGNOSIS — C530 Malignant neoplasm of endocervix: Secondary | ICD-10-CM

## 2021-08-10 NOTE — Telephone Encounter (Signed)
Spoke with the patient and scheduled a new patient appt with Dr Berline Lopes on 12/30 at 12:15 pm. Patient given an arrival time of 11:45 am. Patient given the address for the clinic, along with the policy for mask and visitors

## 2021-08-12 ENCOUNTER — Encounter: Payer: Self-pay | Admitting: Gynecologic Oncology

## 2021-08-12 ENCOUNTER — Telehealth: Payer: Self-pay | Admitting: *Deleted

## 2021-08-12 NOTE — Telephone Encounter (Signed)
Called and moved the patient from 12/30 to 12/27

## 2021-08-16 ENCOUNTER — Encounter: Payer: Self-pay | Admitting: Gynecologic Oncology

## 2021-08-16 ENCOUNTER — Encounter: Payer: Self-pay | Admitting: Oncology

## 2021-08-16 ENCOUNTER — Inpatient Hospital Stay: Payer: Managed Care, Other (non HMO) | Attending: Gynecologic Oncology | Admitting: Gynecologic Oncology

## 2021-08-16 ENCOUNTER — Other Ambulatory Visit: Payer: Self-pay

## 2021-08-16 VITALS — BP 131/67 | HR 77 | Temp 98.5°F | Resp 16 | Ht 66.0 in | Wt 161.4 lb

## 2021-08-16 DIAGNOSIS — R102 Pelvic and perineal pain: Secondary | ICD-10-CM | POA: Diagnosis not present

## 2021-08-16 DIAGNOSIS — Z8673 Personal history of transient ischemic attack (TIA), and cerebral infarction without residual deficits: Secondary | ICD-10-CM | POA: Insufficient documentation

## 2021-08-16 DIAGNOSIS — F419 Anxiety disorder, unspecified: Secondary | ICD-10-CM | POA: Insufficient documentation

## 2021-08-16 DIAGNOSIS — K219 Gastro-esophageal reflux disease without esophagitis: Secondary | ICD-10-CM | POA: Diagnosis not present

## 2021-08-16 DIAGNOSIS — C539 Malignant neoplasm of cervix uteri, unspecified: Secondary | ICD-10-CM | POA: Diagnosis not present

## 2021-08-16 DIAGNOSIS — Z7982 Long term (current) use of aspirin: Secondary | ICD-10-CM | POA: Insufficient documentation

## 2021-08-16 DIAGNOSIS — M1990 Unspecified osteoarthritis, unspecified site: Secondary | ICD-10-CM | POA: Insufficient documentation

## 2021-08-16 DIAGNOSIS — F32A Depression, unspecified: Secondary | ICD-10-CM | POA: Insufficient documentation

## 2021-08-16 DIAGNOSIS — Z79899 Other long term (current) drug therapy: Secondary | ICD-10-CM | POA: Insufficient documentation

## 2021-08-16 DIAGNOSIS — E785 Hyperlipidemia, unspecified: Secondary | ICD-10-CM | POA: Insufficient documentation

## 2021-08-16 DIAGNOSIS — Z780 Asymptomatic menopausal state: Secondary | ICD-10-CM | POA: Diagnosis not present

## 2021-08-16 DIAGNOSIS — M419 Scoliosis, unspecified: Secondary | ICD-10-CM | POA: Insufficient documentation

## 2021-08-16 DIAGNOSIS — M810 Age-related osteoporosis without current pathological fracture: Secondary | ICD-10-CM | POA: Diagnosis not present

## 2021-08-16 LAB — SURGICAL PATHOLOGY

## 2021-08-16 NOTE — Progress Notes (Signed)
Met with Taylor Buchanan and discussed plan for a PET scan and chemoradiation.  Advised her of upcoming appointments and provided her with a printed copy.  She was also given the [employer] folder and encouraged her to call with any questions or needs.

## 2021-08-16 NOTE — Patient Instructions (Signed)
It was a pleasure meeting you today.  I will call you with the PET scan results once I have them.  Given what we know from your cold knife cone biopsy, I am concerned that you will almost certainly need radiation if we were to do surgery.  In this case, given similar cancer outcomes, I recommend that we proceed with planning for definitive treatment using chemotherapy and radiation.  We will get you scheduled to see Dr. Sondra Come, our radiation oncologist, and Dr. Alvy Bimler, her medical oncologist.  Please do not hesitate to call if you have any questions.  Our clinic number is [phone].

## 2021-08-16 NOTE — Progress Notes (Signed)
Called and requested size of cancer lesion and LVI on accession [phone] with Dr. Saralyn Pilar.

## 2021-08-16 NOTE — Progress Notes (Signed)
GYNECOLOGIC ONCOLOGY NEW PATIENT CONSULTATION   Patient Name: Taylor Buchanan  Patient Age: 65 y.o. Date of Service: 08/16/21 Referring Provider: Carlynn Purl MD  Primary Care Provider: Leamon Arnt, MD Consulting Provider: Jeral Pinch, MD   Assessment/Plan:  Postmenopausal patient with at least stage IB1 SCC grade 2 of the cervix.   I spent some time with the patient and her husband reviewing most recent Pap smear, biopsy results, and pathology from surgery. Unfortunately, the patient has at least stage IB1 grade to squamous cell carcinoma of the cervix although based on my exam today, I am suspicious that her entire cervix is involved. Exam findings today or a little bit difficult to interpret give him proximity to her CKC, but her cervix is rather flush with the vagina and is firm.  I reviewed with them that her tumor is 1.7 cm in diameter on CKC, although with positive endo and ectocervical margins. The depth of her cone is just under a centimeter with positive endocervical margins. There is lymphovascular invasion noted on the CKC specimen. Given these findings, in conjunction with my exam today, I am very concerned that the patient would need dual modality treatment if we proceeded with definitive surgery (would meet Sedlis criteria). I will have my office reach out to Taylor Buchanan office to see if ultrasounds done have a measurement for length of the cervix. I will have my office work on getting her PET scan scheduled for approximately three weeks after her CKC to hopefully decrease any avidity related to inflammation from surgery.  Given anticipated treatment plan for definitive chemoradiation, I asked Taylor Buchanan to get the patient scheduled to see Taylor. Sondra Buchanan and Taylor. Alvy Buchanan. We reviewed what definitive chemoradiation would entail from a treatment standpoint. PET imaging will be important to help identify size of the lesion in her cervix and to look for local invasion as well as  metastatic disease.   We reviewed pathogenesis of the majority of cervical cancer in regards to HPV.   All of the patient's and her husband's questions were answered today.  A copy of this note was sent to the patient's referring provider.   80 minutes of total time was spent for this patient encounter, including preparation, face-to-face counseling with the patient and coordination of care, and documentation of the encounter.   Taylor Pinch, MD  Division of Gynecologic Oncology  Department of Obstetrics and Gynecology  [facility]  ___________________________________________  Chief Complaint: Chief Complaint  Patient presents with   Malignant neoplasm of cervix, unspecified site [facility])    History of Present Illness:  Taylor Buchanan is a 65 y.o. y.o. female who is seen in consultation at the request of Taylor Buchanan for an evaluation of newly diagnosed squamous cell carcinoma of the cervix.  The patient had a negative pap in 2014, negative cotesting in 2018. Pap in 04/2021 was ASCUS with + HR HPV. Biopsy showed CIN1.  As she was having postmenopausal bleeding, she underwent to pelvic ultrasounds which showed thin endometrial lining.  Endometrial biopsy from September 2022 showed fragments of squamous mucosa with high-grade dysplasia and scattered fragments of benign inactive endometrium.    Given these results, she underwent endocervical biopsy showing koilocytic effect and squamous mucosa (CIN-1).  No high-grade dysplasia noted.  Given persistent bleeding as well as high-grade dysplasia noted on endometrial biopsy, the patient ultimately underwent cold knife cone with D&C on 12/15.  Findings from surgery were a short cervix (2 cm  in length), grossly normal-appearing.  Final pathology revealed squamous epithelium with high-grade dysplasia of the endometrial curettings although no endocervical tissue or endometrial tissue identified.  Conization showed grade 2  invasive squamous cell carcinoma with circumferential involvement.  All margins noted to be positive.  P16 positive.  LVI was noted.  Diameter of the external portion of the cone was 1.7 cm.  Cone depth was 0.8 cm and endocervical margins were positive.  Today, the patient presents to her visit with her husband.  She notes about a year of postmenopausal bleeding.  She describes this as having very light spotting for a couple of days and then would go weeks without bleeding.  Bleeding was heavy enough to require a panty liner.  It often occurred after intercourse.  She has had heavier bleeding after the cold knife cone.  She also has intermittent pelvic pain for which she takes Tylenol.  She is unsure if this is related to her recent surgery and cancer diagnosis versus anxiety or GI issues.  She notes bowel function is normal although she has had stool on the softer part as she is worried about pushing hard or being constipated.  She endorses baseline bladder function.  She notes overall decreased appetite related to stress with her cancer diagnosis.  Has been using Xanax as needed for anxiety.  Past medical history is notable for a very brief episode this summer which was diagnosed as a TIA.  It sounds like neurology is favoring that it actually may have been related to her migraine history or anxiety as there was no source found.  Her symptoms lasted for very short period of time.  Her scans showed that at some point though she did had a previous stroke.  Patient lives in [place] with her husband.  She recently spent the holidays with one of her children and her first granddaughter.  She is retired.  PAST MEDICAL HISTORY:  Past Medical History:  Diagnosis Date   Abnormal Pap smear of cervix    Arthritis    Depression    GAD (generalized anxiety disorder)    GERD (gastroesophageal reflux disease)    History of migraine    History of transient ischemic attack (TIA) 01/2021   neruologist--- Taylor Buchanan;  admission [facility]/ 2022 in [facility] ,  per note complicated migraine vs anxiety,  less likely TIA   HLD (hyperlipidemia)    Osteoporosis    Pain disorder    Scoliosis      PAST SURGICAL HISTORY:  Past Surgical History:  Procedure Laterality Date   CERVICAL CONIZATION W/BX N/A 08/04/2021   Procedure: CONIZATION CERVIX WITH BIOPSY: DILATION AND CURETTAGE OF UTERUS;  Surgeon: Sherlyn Hay, DO;  Location: [place];  Service: Gynecology;  Laterality: N/A;   CHOLECYSTECTOMY OPEN  2005   approx   TENDON REPAIR Left 2008   Ankle/ foot    OB/GYN HISTORY:  OB History  Gravida Para Term Preterm AB Living  2 2          SAB IAB Ectopic Multiple Live Births               # Outcome Date GA Lbr Len/2nd Weight Sex Delivery Anes PTL Lv  2 Para           1 Para             Patient's last menstrual period was 08/21/2002.  Age at menarche: 75 Age at menopause: 14 Hx of HRT: Denies  Hx of STDs: HPV Last pap: See HPI History of abnormal pap smears: Reports remote history of abnormal Pap smear before recent history.  This did not require any treatment.  SCREENING STUDIES:  Last mammogram: 04/2021  Last colonoscopy: 2018  MEDICATIONS: Outpatient Encounter Medications as of 08/16/2021  Medication Sig   alendronate (FOSAMAX) 70 MG tablet Take 1 tablet (70 mg total) by mouth once a week. Take with a full glass of water on an empty stomach and sit upright for 30 minutes. (Patient taking differently: Take 70 mg by mouth once a week. Take with a full glass of water on an empty stomach and sit upright for 30 minutes. Sunday's)   ALPRAZolam (XANAX) 0.5 MG tablet Take 0.5-1 tablets (0.25-0.5 mg total) by mouth daily as needed for anxiety (panic symptoms).   aspirin EC 81 MG EC tablet Take 1 tablet (81 mg total) by mouth daily. Swallow whole.   atorvastatin (LIPITOR) 40 MG tablet Take 1 tablet (40 mg total) by mouth at bedtime. (Patient taking differently: Take 40 mg by mouth  daily.)   BINAXNOW COVID-19 AG HOME TEST KIT TEST AS DIRECTED TODAY   cholecalciferol (VITAMIN D) 1000 UNITS tablet Take 1,000 Units by mouth daily.   escitalopram (LEXAPRO) 10 MG tablet Take 1 tablet (10 mg total) by mouth daily.   pantoprazole (PROTONIX) 20 MG tablet Take 1 tablet (20 mg total) by mouth daily.   Calcium Carbonate (CALCIUM 600 PO) Take by mouth daily. (Patient not taking: Reported on 08/12/2021)   ibuprofen (ADVIL) 600 MG tablet Take 1 tablet (600 mg total) by mouth every 6 (six) hours as needed. (Patient not taking: Reported on 08/12/2021)   No facility-administered encounter medications on file as of 08/16/2021.    ALLERGIES:  No Known Allergies   FAMILY HISTORY:  Family History  Problem Relation Age of Onset   Mental illness Mother    Stroke Mother    Dementia Mother    Alzheimer's disease Mother    Bladder Cancer Father    Healthy Brother    Healthy Brother    Healthy Brother    CAD Maternal Grandmother    CAD Maternal Grandfather    Healthy Son    Healthy Son    Colon cancer Neg Hx    Breast cancer Neg Hx    Ovarian cancer Neg Hx    Endometrial cancer Neg Hx    Pancreatic cancer Neg Hx    Prostate cancer Neg Hx      SOCIAL HISTORY:  Social Connections: Not on file    REVIEW OF SYSTEMS:  Pertinent positives include pelvic pain, vaginal bleeding, anxiety. Denies appetite changes, fevers, chills, fatigue, unexplained weight changes. Denies hearing loss, neck lumps or masses, mouth sores, ringing in ears or voice changes. Denies cough or wheezing.  Denies shortness of breath. Denies chest pain or palpitations. Denies leg swelling. Denies abdominal distention, pain, blood in stools, constipation, diarrhea, nausea, vomiting, or early satiety. Denies pain with intercourse, dysuria, frequency, hematuria or incontinence. Denies hot flashes or vaginal discharge.   Denies joint pain, back pain or muscle pain/cramps. Denies itching, rash, or  wounds. Denies dizziness, headaches, numbness or seizures. Denies swollen lymph nodes or glands, denies easy bruising or bleeding. Denies depression, confusion, or decreased concentration.  Physical Exam:  Vital Signs for this encounter:  Blood pressure 131/67, pulse 77, temperature 98.5 F (36.9 C), temperature source Oral, resp. rate 16, height '5\' 6"'  (1.676 m), weight 161 lb 6.4 oz (73.2 kg), last  menstrual period 08/21/2002, SpO2 99 %. Body mass index is 26.05 kg/m. General: Alert, oriented, no acute distress.  HEENT: Normocephalic, atraumatic. Sclera anicteric.  Chest: Clear to auscultation bilaterally. No wheezes, rhonchi, or rales. Cardiovascular: Regular rate and rhythm, no murmurs, rubs, or gallops.  Abdomen: Normoactive bowel sounds. Soft, nondistended, nontender to palpation. No masses or hepatosplenomegaly appreciated. No palpable fluid wave.  Extremities: Grossly normal range of motion. Warm, well perfused. No edema bilaterally.  Skin: No rashes or lesions.  Lymphatics: No cervical, supraclavicular, or inguinal adenopathy.  GU:  Normal external female genitalia. No lesions. No discharge or bleeding.             Bladder/urethra:  No lesions or masses, well supported bladder             Vagina: Mildly atrophic, no lesions or masses.             Cervix: Healing cone bed.  Minimal cervix around exterior of cone bed, but what there is is normal in appearance visually.  On bimanual exam, cervix is mildly firm and flush with the vagina.  No obvious evidence of tumor extension to the vagina.             Uterus: Small, mobile.  Cervix is slightly more firm than normal on bimanual and rectovaginal exam.  There is mild thickening along the left parametria, difficult to tell if this is related to her recent cone biopsy or cancer.             Adnexa: No masses appreciated.  Rectal: See above.  LABORATORY AND RADIOLOGIC DATA:  Outside medical records were reviewed to synthesize the above  history, along with the history and physical obtained during the visit.   Lab Results  Component Value Date   WBC 7.3 08/04/2021   HGB 13.8 08/04/2021   HCT 42.5 08/04/2021   PLT 301 08/04/2021   GLUCOSE 85 04/27/2021   CHOL 106 04/27/2021   TRIG 77.0 04/27/2021   HDL 40.50 04/27/2021   LDLCALC 50 04/27/2021   ALT 21 04/27/2021   AST 24 04/27/2021   NA 139 04/27/2021   K 3.7 04/27/2021   CL 104 04/27/2021   CREATININE 0.79 04/27/2021   BUN 16 04/27/2021   CO2 25 04/27/2021   TSH 2.68 04/27/2021   INR 0.9 01/29/2021   HGBA1C 5.4 01/30/2021   Pathology on 12/15: A. ENDOMETRIAL CURETTINGS:  Blood clots containing numerous strips of squamous epithelium with  features of high-grade SIL(CIN-3/severe dysplasia).  Normal endocervical tissue or endometrial tissue are not identified.   B. CERVIX, CONE BIOPSY:  Invasive squamous cell carcinoma, focally keratinizing and moderately  differentiated with circumferential involvement.  All margins including the deep margin are positive for squamous cell  carcinoma.  Please see comment.   Comment: P16 immunostain will be performed on block B1 and the result  will be reported as an addendum.  This case was reviewed with Taylor. Luciano Cutter on 08/05/2021.  He concurs  with the above interpretation.  P16 immunohistochemistry is POSITIVE.   ADDENDUM:  This case is reviewed at the request of Taylor. Berline Lopes.  The  diameter of the cervical cone (part B) is 1.7 cm and therefore the  circumferential dimension is estimated at 4 to 5 cm.  There is focal  lymphovascular space involvement (LVI+).  Case discussed with Elmo Putt  on 08/16/2021.

## 2021-08-17 ENCOUNTER — Telehealth: Payer: Self-pay | Admitting: Gynecologic Oncology

## 2021-08-17 ENCOUNTER — Encounter: Payer: Self-pay | Admitting: Gynecologic Oncology

## 2021-08-17 NOTE — Telephone Encounter (Signed)
Received information from Dr. Ivor Costa office that cervical length on ultrasound on 04/28/2021 was 2.63 cm.  Jeral Pinch MD Gynecologic Oncology

## 2021-08-17 NOTE — Telephone Encounter (Signed)
Received documentation from Dr. Ivor Costa office that cervical length on ultrasound in September was 2.6 cm.  Jeral Pinch MD Gynecologic Oncology

## 2021-08-19 ENCOUNTER — Ambulatory Visit: Payer: Managed Care, Other (non HMO) | Admitting: Gynecologic Oncology

## 2021-08-23 ENCOUNTER — Other Ambulatory Visit: Payer: Self-pay

## 2021-08-23 ENCOUNTER — Encounter: Payer: Self-pay | Admitting: Family Medicine

## 2021-08-23 MED ORDER — PANTOPRAZOLE SODIUM 20 MG PO TBEC: 20.0000 mg | DELAYED_RELEASE_TABLET | Freq: Every day | ORAL | 3 refills | Status: DC

## 2021-08-26 ENCOUNTER — Telehealth: Payer: Self-pay

## 2021-08-26 NOTE — Progress Notes (Signed)
GYN Location of Tumor / Histology: Cervical  Taylor Buchanan presented with symptoms of:  postmenopausal bleeding  Biopsies revealed: squamous epithelium with high-grade dysplasia of the endometrial curettings although no endocervical tissue or endometrial tissue identified.  Conization showed grade 2 invasive squamous cell carcinoma with circumferential involvement.  Past/Anticipated interventions by Gyn/Onc surgery, if any:  Procedure: Cold Knife cone biopsy and dilation and curettage of uterine cavity Surgeon: Mickle Mallory DO   Past/Anticipated interventions by medical oncology, if any: pending appointment with Dr Alvy Bimler  Weight changes, if any: lost weight over the summer but has gained it back to baseline  Bowel/Bladder complaints, if any: No.,   Nausea/Vomiting, if any: no  Pain issues, if any:  no  SAFETY ISSUES: Prior radiation? no Pacemaker/ICD? no Possible current pregnancy? no, postmenopausal Is the patient on methotrexate? no  Current Complaints / other details:  none  Vitals:   08/31/21 0848  BP: 136/78  Pulse: 65  Resp: 20  Temp: 97.7 F (36.5 C)  SpO2: 99%  Weight: 162 lb 3.2 oz (73.6 kg)  Height: 5' 6.5" (1.689 m)

## 2021-08-26 NOTE — Telephone Encounter (Signed)
Spoke with Taylor Buchanan this afternoon. Patient has an upcoming scan on 08/30/21 . She has changed her insurance provider on 08/21/21 to [employer]. Advised patient that our office will send authorization to her new insurance provider. Patient verbalized understanding.

## 2021-08-26 NOTE — Telephone Encounter (Signed)
Patient's PET authozied with the [employer] insurance and rescheduled back to 1/10 at 11 am. Patient called and given the date/time/instructions for the appt.

## 2021-08-26 NOTE — Telephone Encounter (Signed)
Patient states she received a call from [place] stating her PET scan for 08/30/21 has been cancelled due to no pre-authorization being obtained. Patient would like to keep her PET Scan on 08/30/21.  Reached out to radiology scheduling. They are holding a PET scan spot for her on 08/30/21 at 11 am. If insurance authorization is not received by that time the patient will need to reschedule once insurance authorization is obtained.   Advised patient that she will be notified once authorization is obtained and radiology will be notified as well to ensure she can keep the time on 08/30/21. Patient inquiring if she will be able to keep her appointments with Dr. Sondra Come and Dr. Alvy Bimler if her PET scan is cancelled. Per Elmo Putt patient should be able to keep the appointments. We will contact her if they need to be rescheduled. Patient verbalized understanding. Instructed to call with any needs.

## 2021-08-29 ENCOUNTER — Other Ambulatory Visit: Payer: Self-pay | Admitting: Oncology

## 2021-08-29 NOTE — Progress Notes (Signed)
Gynecologic Oncology Multi-Disciplinary Disposition Conference Note  Date of the Conference: 08/29/2021  Patient Name: Taylor Buchanan  Referring Provider: Dr. Terri Piedra Primary GYN Oncologist: Dr. Berline Lopes  Stage/Disposition:  At least stage IB1, grade 2 squamous cell carcinoma of the cervix. Disposition is to primary chemoradiation.   This Multidisciplinary conference took place involving physicians from [place], [place], Radiation Oncology, Pathology, Radiology along with the Gynecologic Oncology Nurse Practitioner and RN.  Comprehensive assessment of the patient's malignancy, staging, need for surgery, chemotherapy, radiation therapy, and need for further testing were reviewed. Supportive measures, both inpatient and following discharge were also discussed. The recommended plan of care is documented. Greater than 35 minutes were spent correlating and coordinating this patient's care.

## 2021-08-30 ENCOUNTER — Other Ambulatory Visit: Payer: Self-pay

## 2021-08-30 ENCOUNTER — Encounter (HOSPITAL_COMMUNITY): Payer: Self-pay

## 2021-08-30 ENCOUNTER — Ambulatory Visit (HOSPITAL_COMMUNITY)
Admission: RE | Admit: 2021-08-30 | Discharge: 2021-08-30 | Disposition: A | Discharge status: Home / Self Care | Payer: Medicare Other | Source: Ambulatory Visit | Attending: Gynecologic Oncology | Admitting: Gynecologic Oncology

## 2021-08-30 ENCOUNTER — Ambulatory Visit (HOSPITAL_COMMUNITY): Payer: Medicare Other

## 2021-08-30 DIAGNOSIS — C539 Malignant neoplasm of cervix uteri, unspecified: Secondary | ICD-10-CM | POA: Insufficient documentation

## 2021-08-30 LAB — GLUCOSE, CAPILLARY: Glucose-Capillary: 95 mg/dL (ref 70–99)

## 2021-08-30 MED ORDER — FLUDEOXYGLUCOSE F - 18 (FDG) INJECTION
8.0000 | Freq: Once | INTRAVENOUS | Status: AC | PRN
  Administered 2021-08-30: 8.05 via INTRAVENOUS

## 2021-08-30 NOTE — Progress Notes (Signed)
Radiation Oncology         [phone] ________________________________  Initial Outpatient Consultation  Name: Taylor Buchanan MRN: [id number]  Date: 08/31/2021  DOB: 1956/07/16  CC:Leamon Arnt, MD  Lafonda Mosses, MD   REFERRING PHYSICIAN: Lafonda Mosses, MD  DIAGNOSIS: The encounter diagnosis was Malignant neoplasm of cervix, unspecified site [facility]).  At least stage IB1 SCC grade 2 of the cervix (p16 positive)  HISTORY OF PRESENT ILLNESS::Taylor Buchanan is a 66 y.o. female who is accompanied by her husband. she is seen as a courtesy of Dr. Berline Lopes for an opinion concerning radiation therapy as part of management for her recently diagnosed cervical cancer. The patient presented this past September of 2022 with post-menopausal bleeding. Subsequently, the patient underwent a Pap smear on 05/11/21 which revealed ASCUS with + HR HPV. Per Dr. Berline Lopes, the patient also underwent pelvic ultrasounds prior to this which showed a thin endometrial lining. Endometrial biopsy performed on 05/18/21 showed fragments of squamous mucosa with high-grade dysplasia and scattered fragments of benign inactive endometrium.   Given these findings, the patient underwent endocervical biopsies on an unknown date (per Dr. Berline Lopes) showing koilocytic effect and squamous mucosa (CIN-1).  No high-grade dysplasia was identified.   Given persistent bleeding as well as high-grade dysplasia noted on endometrial biopsy, the patient underwent a cold knife cone cervical biopsy on 08/04/21 which revealed invasive squamous cell carcinoma (p16 positive), focally keratinizing, and moderately differentiated with circumferential involvement. All margins, including the deep margin were noted as positive for SCC. Endometrial curetting's also performed revealed blood clots containing numerous strips of squamous epithelium, with features of high-grade SIL (CIN-3/severe dysplasia).  Subsequently, the patient was referred to Dr.  Berline Lopes on 08/16/21 for further evaluation and recommendation regarding treatment. During this visit, the patient reported a year of postmenopausal bleeding, detailed as very light spotting for a couple of days, followed by sometimes weeks without bleeding. When her bleeding episodes were heavy, the patient reported using a panty liner, and noted that heavy bleeding was typically preceded by intercourse. The patient also reported intermittent pelvic pain for which she takes Tylenol, though noted that she is unsure if this is related to her recent procedure. Pelvic exam performed during this visit revealed the cervix as mildly firm and flush with the vagina.  No obvious evidence of tumor extension to the vagina was appreciated. Exam of the uterus revealed the cervix as slightly more firm than normal on bimanual and rectovaginal exam.  Some mild thickening was appreciated along the left parametria, though Dr. Berline Lopes noted that it was difficult to tell if this was related to her recent cone biopsy or cancer.  Cervical length on ultrasound from Dr. Ivor Costa office September 8 was estimated to be 2.63 cm  Pertinent imaging this far includes: --PET on 08/30/21 which demonstrated the focal hypermetabolism in the region of the cervix, consistent with known primary cervical carcinoma. No evidence of pelvic lymph node involvement, or distant metastatic disease was appreciated.  PREVIOUS RADIATION THERAPY: No  PAST MEDICAL HISTORY:  Past Medical History:  Diagnosis Date   Abnormal Pap smear of cervix    Arthritis    Cervical cancer (HCC)    Depression    GAD (generalized anxiety disorder)    GERD (gastroesophageal reflux disease)    History of migraine    History of transient ischemic attack (TIA) 01/2021   neruologist--- dr Leonie Man;  [facility]/ 2022 in [facility] ,  per note complicated migraine vs anxiety,  less likely TIA   HLD (hyperlipidemia)    Osteoporosis    Pain disorder    Scoliosis     PAST  SURGICAL HISTORY: Past Surgical History:  Procedure Laterality Date   CERVICAL CONIZATION W/BX N/A 08/04/2021   Procedure: CONIZATION CERVIX WITH BIOPSY: DILATION AND CURETTAGE OF UTERUS;  Surgeon: Sherlyn Hay, DO;  Location: [place];  Service: Gynecology;  Laterality: N/A;   CHOLECYSTECTOMY OPEN  2005   approx   TENDON REPAIR Left 2008   Ankle/ foot    FAMILY HISTORY:  Family History  Problem Relation Age of Onset   Mental illness Mother    Stroke Mother    Dementia Mother    Alzheimer's disease Mother    Bladder Cancer Father    Healthy Brother    Healthy Brother    Healthy Brother    CAD Maternal Grandmother    CAD Maternal Grandfather    Healthy Son    Healthy Son    Colon cancer Neg Hx    Breast cancer Neg Hx    Ovarian cancer Neg Hx    Endometrial cancer Neg Hx    Pancreatic cancer Neg Hx    Prostate cancer Neg Hx     SOCIAL HISTORY:  Social History   Tobacco Use   Smoking status: Former    Packs/day: 1.00    Years: 15.00    Pack years: 15.00    Types: Cigarettes    Quit date: 1997    Years since quitting: 26.0   Smokeless tobacco: Never  Vaping Use   Vaping Use: Never used  Substance Use Topics   Alcohol use: Yes    Alcohol/week: 3.0 standard drinks    Types: 3 Standard drinks or equivalent per week    Comment: occ   Drug use: Never    ALLERGIES: No Known Allergies  MEDICATIONS:  Current Outpatient Medications  Medication Sig Dispense Refill   acetaminophen (TYLENOL) 500 MG tablet Take 500 mg by mouth as needed.     alendronate (FOSAMAX) 70 MG tablet Take 1 tablet (70 mg total) by mouth once a week. Take with a full glass of water on an empty stomach and sit upright for 30 minutes. (Patient taking differently: Take 70 mg by mouth once a week. Take with a full glass of water on an empty stomach and sit upright for 30 minutes. Sunday's) 12 tablet 3   ALPRAZolam (XANAX) 0.5 MG tablet Take 0.5-1 tablets (0.25-0.5 mg  total) by mouth daily as needed for anxiety (panic symptoms). 30 tablet 0   aspirin EC 81 MG EC tablet Take 1 tablet (81 mg total) by mouth daily. Swallow whole. 30 tablet 11   atorvastatin (LIPITOR) 40 MG tablet Take 1 tablet (40 mg total) by mouth at bedtime. (Patient taking differently: Take 40 mg by mouth daily.) 90 tablet 3   BINAXNOW COVID-19 AG HOME TEST KIT TEST AS DIRECTED TODAY     cholecalciferol (VITAMIN D) 1000 UNITS tablet Take 1,000 Units by mouth daily.     escitalopram (LEXAPRO) 10 MG tablet Take 1 tablet (10 mg total) by mouth daily. 90 tablet 2   pantoprazole (PROTONIX) 20 MG tablet Take 1 tablet (20 mg total) by mouth daily. 90 tablet 3   Calcium Carbonate (CALCIUM 600 PO) Take by mouth daily. (Patient not taking: Reported on 08/12/2021)     ibuprofen (ADVIL) 600 MG tablet Take 1 tablet (600 mg total) by mouth every 6 (six) hours as needed. (  Patient not taking: Reported on 08/31/2021) 30 tablet 0   No current facility-administered medications for this encounter.    REVIEW OF SYSTEMS:  A 10+ POINT REVIEW OF SYSTEMS WAS OBTAINED including neurology, dermatology, psychiatry, cardiac, respiratory, lymph, extremities, GI, GU, musculoskeletal, constitutional, reproductive, HEENT.  She denies any pelvic pain.  She denies any urinary symptoms such as incontinence or dysuria.  She denies any rectal bleeding or change in bowel habits.   PHYSICAL EXAM:  height is 5' 6.5" (1.689 m) and weight is 162 lb 3.2 oz (73.6 kg). Her temperature is 97.7 F (36.5 C). Her blood pressure is 136/78 and her pulse is 65. Her respiration is 20 and oxygen saturation is 99%.   General: Alert and oriented, in no acute distress HEENT: Head is normocephalic. Extraocular movements are intact.  Neck: Neck is supple, no palpable cervical or supraclavicular lymphadenopathy. Heart: Regular in rate and rhythm with no murmurs, rubs, or gallops. Chest: Clear to auscultation bilaterally, with no rhonchi, wheezes, or  rales. Abdomen: Soft, nontender, nondistended, with no rigidity or guarding. Extremities: No cyanosis or edema. Lymphatics: see Neck Exam Skin: No concerning lesions. Musculoskeletal: symmetric strength and muscle tone throughout. Neurologic: Cranial nerves II through XII are grossly intact. No obvious focalities. Speech is fluent. Coordination is intact. Psychiatric: Judgment and insight are intact. Affect is appropriate. On pelvic examination the external genitalia were unremarkable. A speculum exam was performed.  The cervical area was noted to have surgical changes consistent with recent cone.  On bimanual and rectovaginal examination the cervix os was noted to be expanded consistent with recent cone. The cervix felt to be somewhat firm and slightly enlarged.  No obvious parametrial extension but difficult to evaluate in the outpatient setting.  ECOG = 1  0 - Asymptomatic (Fully active, able to carry on all predisease activities without restriction)  1 - Symptomatic but completely ambulatory (Restricted in physically strenuous activity but ambulatory and able to carry out work of a light or sedentary nature. For example, light housework, office work)  2 - Symptomatic, <50% in bed during the day (Ambulatory and capable of all self care but unable to carry out any work activities. Up and about more than 50% of waking hours)  3 - Symptomatic, >50% in bed, but not bedbound (Capable of only limited self-care, confined to bed or chair 50% or more of waking hours)  4 - Bedbound (Completely disabled. Cannot carry on any self-care. Totally confined to bed or chair)  5 - Death   Eustace Pen MM, Creech RH, Tormey DC, et al. [phone]). "Toxicity and response criteria of the [facility]". [place] Oncol. 5 (6): 649-55  LABORATORY DATA:  Lab Results  Component Value Date   WBC 7.3 08/04/2021   HGB 13.8 08/04/2021   HCT 42.5 08/04/2021   MCV 98.2 08/04/2021   PLT 301 08/04/2021    NEUTROABS 7.5 04/25/2021   Lab Results  Component Value Date   NA 139 04/27/2021   K 3.7 04/27/2021   CL 104 04/27/2021   CO2 25 04/27/2021   GLUCOSE 85 04/27/2021   CREATININE 0.79 04/27/2021   CALCIUM 9.8 04/27/2021      RADIOGRAPHY: NM PET Image Initial (PI) Skull Base To Thigh  Result Date: 08/30/2021 CLINICAL DATA:  Initial treatment strategy for cervical carcinoma. EXAM: NUCLEAR MEDICINE PET SKULL BASE TO THIGH TECHNIQUE: 8.1 mCi F-18 FDG was injected intravenously. Full-ring PET imaging was performed from the skull base to thigh after the radiotracer.  CT data was obtained and used for attenuation correction and anatomic localization. Fasting blood glucose: 95 mg/dl COMPARISON:  None. FINDINGS: Mediastinal blood-pool activity (background): SUV max = 2.5 Liver activity (reference): SUV max = N/A NECK:  No hypermetabolic lymph nodes or masses. Incidental CT findings:  None. CHEST: No hypermetabolic masses or lymphadenopathy. No suspicious pulmonary nodules seen on CT images. Incidental CT findings:  None. ABDOMEN/PELVIS: Focal intense hypermetabolism is seen in the region of the cervix, with SUV max of 9.7, consistent with known primary cervical carcinoma. No hypermetabolic lymph nodes in the pelvis or abdomen.No abnormal hypermetabolic activity within the liver, pancreas, adrenal glands, or spleen. Incidental CT findings: Nonobstructive 3 mm left renal calculus. No evidence of hydronephrosis. SKELETON: No focal hypermetabolic bone lesions to suggest skeletal metastasis. Incidental CT findings: Thoracolumbar levoscoliosis and degenerative changes. IMPRESSION: Focal hypermetabolism in the region of the cervix, consistent with known primary cervical carcinoma. No evidence of pelvic lymph node or distant metastatic disease. Electronically Signed   By: Marlaine Hind M.D.   On: 08/30/2021 16:43      IMPRESSION: At least stage IB1 SCC grade 2 of the cervix (p16 positive)  Pathologic findings  during conization showed a moderately invasive squamous cell carcinoma of the cervix with all surgical margins positive and presence of lymphovascular space invasion.  Given these findings despite negative PET scan this week she would be at risk for lymphatic spread within the parametrial nodal region and pelvic region.  Patient's case was presented at the multidisciplinary gynecologic oncology conference earlier this week and recommendations were for definitive treatment with radiosensitizing chemotherapy and external beam radiation therapy followed by intracavitary brachytherapy treatments.  Given the limited involvement on PET scan without any obvious parametrial spread she may be a potential candidate for limited brachytherapy followed by extrafascial hysterectomy depending on discussion with Dr. Berline Lopes.  Today, I talked to the patient and husband about the findings and work-up thus far.  We discussed the natural history of invasive squamous cell carcinoma of the cervix and general treatment, highlighting the role of radiotherapy in the management.  We discussed the available radiation techniques, and focused on the details of logistics and delivery.  We reviewed the anticipated acute and late sequelae associated with radiation in this setting.  The patient was encouraged to ask questions that I answered to the best of my ability.  A patient consent form was discussed and signed.  We retained a copy for our records.  The patient would like to proceed with radiation and will be scheduled for CT simulation.  PLAN: She will return January 17 for CT simulation with treatments to begin January 23 concomitant with radiosensitizing weekly cisplatin based chemotherapy.  Anticipate 5 weeks of external beam radiation therapy directed at the pelvis region followed by intracavitary brachytherapy treatment.   70 minutes of total time was spent for this patient encounter, including preparation, face-to-face  counseling with the patient and coordination of care, physical exam, and documentation of the encounter.   ------------------------------------------------  Blair Promise, PhD, MD  This document serves as a record of services personally performed by Gery Pray, MD. It was created on his behalf by Roney Mans, a trained medical scribe. The creation of this record is based on the scribe's personal observations and the provider's statements to them. This document has been checked and approved by the attending provider.

## 2021-08-31 ENCOUNTER — Encounter: Payer: Self-pay | Admitting: Radiation Oncology

## 2021-08-31 ENCOUNTER — Ambulatory Visit
Admission: RE | Admit: 2021-08-31 | Discharge: 2021-08-31 | Disposition: A | Discharge status: Home / Self Care | Payer: Medicare Other | Source: Ambulatory Visit | Attending: Radiation Oncology | Admitting: Radiation Oncology

## 2021-08-31 VITALS — BP 136/78 | HR 65 | Temp 97.7°F | Resp 20 | Ht 66.5 in | Wt 162.2 lb

## 2021-08-31 DIAGNOSIS — Z803 Family history of malignant neoplasm of breast: Secondary | ICD-10-CM | POA: Insufficient documentation

## 2021-08-31 DIAGNOSIS — Z8052 Family history of malignant neoplasm of bladder: Secondary | ICD-10-CM | POA: Insufficient documentation

## 2021-08-31 DIAGNOSIS — E785 Hyperlipidemia, unspecified: Secondary | ICD-10-CM | POA: Diagnosis not present

## 2021-08-31 DIAGNOSIS — Z7982 Long term (current) use of aspirin: Secondary | ICD-10-CM | POA: Insufficient documentation

## 2021-08-31 DIAGNOSIS — Z87891 Personal history of nicotine dependence: Secondary | ICD-10-CM | POA: Diagnosis not present

## 2021-08-31 DIAGNOSIS — K219 Gastro-esophageal reflux disease without esophagitis: Secondary | ICD-10-CM | POA: Diagnosis not present

## 2021-08-31 DIAGNOSIS — F32A Depression, unspecified: Secondary | ICD-10-CM | POA: Diagnosis not present

## 2021-08-31 DIAGNOSIS — Z800 Family history of malignant neoplasm of digestive organs: Secondary | ICD-10-CM | POA: Diagnosis not present

## 2021-08-31 DIAGNOSIS — Z79899 Other long term (current) drug therapy: Secondary | ICD-10-CM | POA: Diagnosis not present

## 2021-08-31 DIAGNOSIS — F411 Generalized anxiety disorder: Secondary | ICD-10-CM | POA: Insufficient documentation

## 2021-08-31 DIAGNOSIS — N200 Calculus of kidney: Secondary | ICD-10-CM | POA: Insufficient documentation

## 2021-08-31 DIAGNOSIS — C539 Malignant neoplasm of cervix uteri, unspecified: Secondary | ICD-10-CM | POA: Diagnosis present

## 2021-08-31 HISTORY — DX: Malignant neoplasm of cervix uteri, unspecified: C53.9

## 2021-08-31 NOTE — Progress Notes (Signed)
See MD note for nursing evaluation. °

## 2021-09-01 ENCOUNTER — Inpatient Hospital Stay: Payer: Medicare Other

## 2021-09-01 ENCOUNTER — Encounter: Payer: Self-pay | Admitting: Hematology and Oncology

## 2021-09-01 ENCOUNTER — Other Ambulatory Visit: Payer: Self-pay

## 2021-09-01 ENCOUNTER — Other Ambulatory Visit (HOSPITAL_COMMUNITY): Payer: Self-pay

## 2021-09-01 ENCOUNTER — Inpatient Hospital Stay: Payer: Medicare Other | Admitting: Emergency Medicine

## 2021-09-01 ENCOUNTER — Inpatient Hospital Stay: Payer: Medicare Other | Admitting: Hematology and Oncology

## 2021-09-01 DIAGNOSIS — C539 Malignant neoplasm of cervix uteri, unspecified: Secondary | ICD-10-CM | POA: Diagnosis not present

## 2021-09-01 DIAGNOSIS — Z7982 Long term (current) use of aspirin: Secondary | ICD-10-CM | POA: Diagnosis not present

## 2021-09-01 DIAGNOSIS — Z5111 Encounter for antineoplastic chemotherapy: Secondary | ICD-10-CM | POA: Insufficient documentation

## 2021-09-01 DIAGNOSIS — M419 Scoliosis, unspecified: Secondary | ICD-10-CM | POA: Insufficient documentation

## 2021-09-01 DIAGNOSIS — F32A Depression, unspecified: Secondary | ICD-10-CM | POA: Diagnosis not present

## 2021-09-01 DIAGNOSIS — M1990 Unspecified osteoarthritis, unspecified site: Secondary | ICD-10-CM | POA: Insufficient documentation

## 2021-09-01 DIAGNOSIS — Z8052 Family history of malignant neoplasm of bladder: Secondary | ICD-10-CM | POA: Insufficient documentation

## 2021-09-01 DIAGNOSIS — Z87891 Personal history of nicotine dependence: Secondary | ICD-10-CM | POA: Insufficient documentation

## 2021-09-01 DIAGNOSIS — Z8673 Personal history of transient ischemic attack (TIA), and cerebral infarction without residual deficits: Secondary | ICD-10-CM

## 2021-09-01 DIAGNOSIS — Z510 Encounter for antineoplastic radiation therapy: Secondary | ICD-10-CM | POA: Diagnosis present

## 2021-09-01 DIAGNOSIS — Z79899 Other long term (current) drug therapy: Secondary | ICD-10-CM | POA: Insufficient documentation

## 2021-09-01 DIAGNOSIS — M810 Age-related osteoporosis without current pathological fracture: Secondary | ICD-10-CM | POA: Insufficient documentation

## 2021-09-01 DIAGNOSIS — E785 Hyperlipidemia, unspecified: Secondary | ICD-10-CM | POA: Diagnosis not present

## 2021-09-01 DIAGNOSIS — F419 Anxiety disorder, unspecified: Secondary | ICD-10-CM | POA: Insufficient documentation

## 2021-09-01 DIAGNOSIS — Z780 Asymptomatic menopausal state: Secondary | ICD-10-CM | POA: Insufficient documentation

## 2021-09-01 DIAGNOSIS — K219 Gastro-esophageal reflux disease without esophagitis: Secondary | ICD-10-CM | POA: Diagnosis not present

## 2021-09-01 MED ORDER — ONDANSETRON HCL 8 MG PO TABS
8.0000 mg | ORAL_TABLET | Freq: Three times a day (TID) | ORAL | 1 refills | Status: DC | PRN
  Filled 2021-09-01: qty 30, 10d supply, fill #0

## 2021-09-01 MED ORDER — LIDOCAINE-PRILOCAINE 2.5-2.5 % EX CREA
TOPICAL_CREAM | CUTANEOUS | 3 refills | Status: DC
  Filled 2021-09-01: qty 30, 30d supply, fill #0

## 2021-09-01 MED ORDER — PROCHLORPERAZINE MALEATE 10 MG PO TABS
10.0000 mg | ORAL_TABLET | Freq: Four times a day (QID) | ORAL | 1 refills | Status: DC | PRN
  Filled 2021-09-01: qty 30, 8d supply, fill #0

## 2021-09-01 NOTE — Progress Notes (Signed)
[place] NOTE  Patient Care Team: Leamon Arnt, MD as PCP - General (Family Medicine) Key, Nelia Shi, NP as Nurse Practitioner (Gynecology) Lyndee Hensen, PT as Physical Therapist (Physical Therapy) Awanda Mink Craige Cotta, RN as Oncology Nurse Navigator (Oncology)  ASSESSMENT & PLAN:  Malignant neoplasm of cervix [facility]) I have reviewed multiple imaging studies with the patient She has early stage disease I believe concurrent chemoradiation therapy will provide a cure  We discussed the role of chemotherapy. The intent is of curative intent.  We discussed some of the risks, benefits, side-effects of cisplatin and its role as chemo sensitizing agent. The plan for weekly cisplatin for x5-6 doses along with radiation treatment.  Some of the short term side-effects included, though not limited to, including weight loss, life threatening infections, risk of allergic reactions, need for transfusions of blood products, nausea, vomiting, change in bowel habits, loss of hair, admission to hospital for various reasons, and risks of death.   Long term side-effects are also discussed including risks of infertility, permanent damage to nerve function, hearing loss, chronic fatigue, kidney damage with possibility needing hemodialysis, and rare secondary malignancy including bone marrow disorders.  The patient is aware that the response rates discussed earlier is not guaranteed.  After a long discussion, patient made an informed decision to proceed with the prescribed plan of care.   Patient education material was dispensed. I recommend port placement and chemo education class We will schedule additional appointment once radiation simulation has been performed Tentatively, we will get her started with treatment within the next week to 10 days We discussed the role of biospecimen collection for clinical trial and she appears interested    Orders Placed This Encounter  Procedures    IR IMAGING GUIDED PORT INSERTION    Standing Status:   Future    Standing Expiration Date:   09/01/2022    Order Specific Question:   Reason for Exam (SYMPTOM  OR DIAGNOSIS REQUIRED)    Answer:   need port to start chemo on 1/23    Order Specific Question:   Preferred Imaging Location?    Answer:   [facility]   CBC with Differential (Cancer Center Only)    Standing Status:   Standing    Number of Occurrences:   20    Standing Expiration Date:   7/94/8016   Basic Metabolic Panel - [place] Only    Standing Status:   Standing    Number of Occurrences:   20    Standing Expiration Date:   09/01/2022   Magnesium    Standing Status:   Standing    Number of Occurrences:   20    Standing Expiration Date:   09/01/2022    The total time spent in the appointment was 60 minutes encounter with patients including review of chart and various tests results, discussions about plan of care and coordination of care plan   All questions were answered. The patient knows to call the clinic with any problems, questions or concerns. No barriers to learning was detected.  Heath Lark, MD 1/12/20238:00 PM  CHIEF COMPLAINTS/PURPOSE OF CONSULTATION:  Cervical cancer  HISTORY OF PRESENTING ILLNESS:  Taylor Buchanan 66 y.o. female is here because of recent diagnosis of cervical cancer She is here accompanied by her husband Her symptoms began with postmenopausal bleeding leading to multiple evaluation, biopsy and imaging study She denies pelvic pain or excessive vaginal bleeding She was told she had history  of stroke but she is 100% functional with no focal neurological deficits  I have reviewed her chart and materials related to her cancer extensively and collaborated history with the patient. Summary of oncologic history is as follows: Oncology History  Malignant neoplasm of cervix ([place])  07/05/2021 Pathology Results   A. ENDOMETRIAL CURETTINGS:  Blood clots containing numerous strips of  squamous epithelium with features of high-grade SIL(CIN-3/severe dysplasia).  Normal endocervical tissue or endometrial tissue are not identified.   B. CERVIX, CONE BIOPSY:  Invasive squamous cell carcinoma, focally keratinizing and moderately differentiated with circumferential involvement.  All margins including the deep margin are positive for squamous cell carcinoma.  Please see comment. [id number] positive   08/16/2021 Initial Diagnosis   Malignant neoplasm of cervix ([place])   08/30/2021 PET scan   Focal hypermetabolism in the region of the cervix, consistent with known primary cervical carcinoma.   No evidence of pelvic lymph node or distant metastatic disease.     09/01/2021 Cancer Staging   Staging form: Cervix Uteri, AJCC Version 9 - Clinical stage from 09/01/2021: FIGO Stage IB1 (cT1b1, cN0, cM0) - Signed by Heath Lark, MD on 09/01/2021 Stage prefix: Initial diagnosis    09/12/2021 -  Chemotherapy   Patient is on Treatment Plan : cervical Cisplatin q7d       MEDICAL HISTORY:  Past Medical History:  Diagnosis Date   Abnormal Pap smear of cervix    Arthritis    Cervical cancer (HCC)    Depression    GAD (generalized anxiety disorder)    GERD (gastroesophageal reflux disease)    History of migraine    History of transient ischemic attack (TIA) 01/2021   neruologist--- dr Leonie Man;  [facility]/ 2022 in [facility] ,  per note complicated migraine vs anxiety,  less likely TIA   HLD (hyperlipidemia)    Osteoporosis    Pain disorder    Scoliosis     SURGICAL HISTORY: Past Surgical History:  Procedure Laterality Date   CERVICAL CONIZATION W/BX N/A 08/04/2021   Procedure: CONIZATION CERVIX WITH BIOPSY: DILATION AND CURETTAGE OF UTERUS;  Surgeon: Sherlyn Hay, DO;  Location: [place];  Service: Gynecology;  Laterality: N/A;   CHOLECYSTECTOMY OPEN  2005   approx   TENDON REPAIR Left 2008   Ankle/ foot    SOCIAL HISTORY: Social History   Socioeconomic  History   Marital status: Married    Spouse name: Not on file   Number of children: 2   Years of education: Not on file   Highest education level: Not on file  Occupational History    Employer: [employer]    Comment: retired   Occupation: retired  Tobacco Use   Smoking status: Former    Packs/day: 1.00    Years: 15.00    Pack years: 15.00    Types: Cigarettes    Quit date: 1997    Years since quitting: 26.0   Smokeless tobacco: Never  Vaping Use   Vaping Use: Never used  Substance and Sexual Activity   Alcohol use: Yes    Alcohol/week: 3.0 standard drinks    Types: 3 Standard drinks or equivalent per week    Comment: occ   Drug use: Never   Sexual activity: Yes    Birth control/protection: Post-menopausal  Other Topics Concern   Not on file  Social History Narrative   Work or School: Paediatric nurse [place] Situation: lives with husband  Spiritual Beliefs: Christian      Lifestyle: no regular exercise; diet good            Social Determinants of Health   Financial Resource Strain: Not on file  Food Insecurity: Not on file  Transportation Needs: Not on file  Physical Activity: Not on file  Stress: Not on file  Social Connections: Not on file  Intimate Partner Violence: Not on file    FAMILY HISTORY: Family History  Problem Relation Age of Onset   Mental illness Mother    Stroke Mother    Dementia Mother    Alzheimer's disease Mother    Bladder Cancer Father    Healthy Brother    Healthy Brother    Healthy Brother    CAD Maternal Grandmother    CAD Maternal Grandfather    Healthy Son    Healthy Son    Colon cancer Neg Hx    Breast cancer Neg Hx    Ovarian cancer Neg Hx    Endometrial cancer Neg Hx    Pancreatic cancer Neg Hx    Prostate cancer Neg Hx     ALLERGIES:  has No Known Allergies.  MEDICATIONS:  Current Outpatient Medications  Medication Sig Dispense Refill   acetaminophen (TYLENOL) 500 MG tablet Take 500  mg by mouth as needed.     alendronate (FOSAMAX) 70 MG tablet Take 1 tablet (70 mg total) by mouth once a week. Take with a full glass of water on an empty stomach and sit upright for 30 minutes. (Patient taking differently: Take 70 mg by mouth once a week. Take with a full glass of water on an empty stomach and sit upright for 30 minutes. Sunday's) 12 tablet 3   ALPRAZolam (XANAX) 0.5 MG tablet Take 0.5-1 tablets (0.25-0.5 mg total) by mouth daily as needed for anxiety (panic symptoms). 30 tablet 0   aspirin EC 81 MG EC tablet Take 1 tablet (81 mg total) by mouth daily. Swallow whole. 30 tablet 11   atorvastatin (LIPITOR) 40 MG tablet Take 1 tablet (40 mg total) by mouth at bedtime. (Patient taking differently: Take 40 mg by mouth daily.) 90 tablet 3   BINAXNOW COVID-19 AG HOME TEST KIT TEST AS DIRECTED TODAY     Calcium Carbonate (CALCIUM 600 PO) Take by mouth daily. (Patient not taking: Reported on 08/12/2021)     cholecalciferol (VITAMIN D) 1000 UNITS tablet Take 1,000 Units by mouth daily.     escitalopram (LEXAPRO) 10 MG tablet Take 1 tablet (10 mg total) by mouth daily. 90 tablet 2   lidocaine-prilocaine (EMLA) cream Apply to affected area once 30 g 3   ondansetron (ZOFRAN) 8 MG tablet Take 1 tablet by mouth every 8  hours as needed. 30 tablet 1   pantoprazole (PROTONIX) 20 MG tablet Take 1 tablet (20 mg total) by mouth daily. 90 tablet 3   prochlorperazine (COMPAZINE) 10 MG tablet Take 1 tablet  by mouth every 6 hours as needed (Nausea or vomiting). 30 tablet 1   No current facility-administered medications for this visit.    REVIEW OF SYSTEMS:   Constitutional: Denies fevers, chills or abnormal night sweats Eyes: Denies blurriness of vision, double vision or watery eyes Ears, nose, mouth, throat, and face: Denies mucositis or sore throat Respiratory: Denies cough, dyspnea or wheezes Cardiovascular: Denies palpitation, chest discomfort or lower extremity swelling Gastrointestinal:   Denies nausea, heartburn or change in bowel habits Skin: Denies abnormal skin rashes Lymphatics: Denies new lymphadenopathy or  easy bruising Neurological:Denies numbness, tingling or new weaknesses Behavioral/Psych: Mood is stable, no new changes  All other systems were reviewed with the patient and are negative.  PHYSICAL EXAMINATION: ECOG PERFORMANCE STATUS: 0 - Asymptomatic GENERAL:alert, no distress and comfortable SKIN: skin color, texture, turgor are normal, no rashes or significant lesions EYES: normal, conjunctiva are pink and non-injected, sclera clear OROPHARYNX:no exudate, no erythema and lips, buccal mucosa, and tongue normal  NECK: supple, thyroid normal size, non-tender, without nodularity LYMPH:  no palpable lymphadenopathy in the cervical, axillary or inguinal LUNGS: clear to auscultation and percussion with normal breathing effort HEART: regular rate & rhythm and no murmurs and no lower extremity edema ABDOMEN:abdomen soft, non-tender and normal bowel sounds Musculoskeletal:no cyanosis of digits and no clubbing  PSYCH: alert & oriented x 3 with fluent speech NEURO: no focal motor/sensory deficits  LABORATORY DATA:  I have reviewed the data as listed Lab Results  Component Value Date   WBC 7.3 08/04/2021   HGB 13.8 08/04/2021   HCT 42.5 08/04/2021   MCV 98.2 08/04/2021   PLT 301 08/04/2021   Recent Labs    01/29/21 2023 01/30/21 0603 04/25/21 0920 04/27/21 1533  NA 137  --  140 139  K 3.6  --  3.4* 3.7  CL 102  --  104 104  CO2 23  --  24 25  GLUCOSE 91  --  115* 85  BUN 17  --  18 16  CREATININE 0.78 0.77 0.76 0.79  CALCIUM 9.6  --  9.7 9.8  GFRNONAA >60 >60 >60  --   PROT 7.3  --  7.9 7.7  ALBUMIN 4.4  --  4.4 4.5  AST 23  --  32 24  ALT 12  --  28 21  ALKPHOS 72  --  84 81  BILITOT 0.4  --  0.8 0.7    RADIOGRAPHIC STUDIES: I have reviewed multiple imaging studies with the patient and her husband I have personally reviewed the radiological  images as listed and agreed with the findings in the report. NM PET Image Initial (PI) Skull Base To Thigh  Result Date: 08/30/2021 CLINICAL DATA:  Initial treatment strategy for cervical carcinoma. EXAM: NUCLEAR MEDICINE PET SKULL BASE TO THIGH TECHNIQUE: 8.1 mCi F-18 FDG was injected intravenously. Full-ring PET imaging was performed from the skull base to thigh after the radiotracer. CT data was obtained and used for attenuation correction and anatomic localization. Fasting blood glucose: 95 mg/dl COMPARISON:  None. FINDINGS: Mediastinal blood-pool activity (background): SUV max = 2.5 Liver activity (reference): SUV max = N/A NECK:  No hypermetabolic lymph nodes or masses. Incidental CT findings:  None. CHEST: No hypermetabolic masses or lymphadenopathy. No suspicious pulmonary nodules seen on CT images. Incidental CT findings:  None. ABDOMEN/PELVIS: Focal intense hypermetabolism is seen in the region of the cervix, with SUV max of 9.7, consistent with known primary cervical carcinoma. No hypermetabolic lymph nodes in the pelvis or abdomen.No abnormal hypermetabolic activity within the liver, pancreas, adrenal glands, or spleen. Incidental CT findings: Nonobstructive 3 mm left renal calculus. No evidence of hydronephrosis. SKELETON: No focal hypermetabolic bone lesions to suggest skeletal metastasis. Incidental CT findings: Thoracolumbar levoscoliosis and degenerative changes. IMPRESSION: Focal hypermetabolism in the region of the cervix, consistent with known primary cervical carcinoma. No evidence of pelvic lymph node or distant metastatic disease. Electronically Signed   By: Marlaine Hind M.D.   On: 08/30/2021 16:43

## 2021-09-01 NOTE — Progress Notes (Signed)
START OFF PATHWAY REGIMEN - Other   [id number]:Cisplatin 40 mg/m2 IV D1 q7 Days + RT:   A cycle is every 7 days:     Cisplatin   **Always confirm dose/schedule in your pharmacy ordering system**  Patient Characteristics: Intent of Therapy: Curative Intent, Discussed with Patient 

## 2021-09-01 NOTE — Assessment & Plan Note (Signed)
I have reviewed multiple imaging studies with the patient She has early stage disease I believe concurrent chemoradiation therapy will provide a cure  We discussed the role of chemotherapy. The intent is of curative intent.  We discussed some of the risks, benefits, side-effects of cisplatin and its role as chemo sensitizing agent. The plan for weekly cisplatin for x5-6 doses along with radiation treatment.  Some of the short term side-effects included, though not limited to, including weight loss, life threatening infections, risk of allergic reactions, need for transfusions of blood products, nausea, vomiting, change in bowel habits, loss of hair, admission to hospital for various reasons, and risks of death.   Long term side-effects are also discussed including risks of infertility, permanent damage to nerve function, hearing loss, chronic fatigue, kidney damage with possibility needing hemodialysis, and rare secondary malignancy including bone marrow disorders.  The patient is aware that the response rates discussed earlier is not guaranteed.  After a long discussion, patient made an informed decision to proceed with the prescribed plan of care.   Patient education material was dispensed. I recommend port placement and chemo education class We will schedule additional appointment once radiation simulation has been performed Tentatively, we will get her started with treatment within the next week to 10 days We discussed the role of biospecimen collection for clinical trial and she appears interested

## 2021-09-01 NOTE — Research (Signed)
MTG-015 - Tissue and Bodily Fluids: Translational Medicine: Discovery and Evaluation of Biomarkers/Pharmacogenomics for the Diagnosis and Personalized Management of Patients   MT 3505-A  Patient Taylor Buchanan was identified by MD Alvy Bimler as a potential candidate for the above listed study.  This Clinical Research Nurse met with Taylor Buchanan, [id number], on 09/01/21 in a manner and location that ensures patient privacy to discuss participation in the above listed research study.  Patient is Accompanied by husband Scientist, physiological .  A copy of the informed consent document and separate HIPAA Authorization was provided to the patient.  Patient reads, speaks, and understands [place].   Patient was provided with the business card of this Nurse and encouraged to contact the research team with any questions.  Approximately 25 minutes were spent with the patient reviewing the informed consent documents.  Patient was provided the option of taking informed consent documents home to review and was encouraged to review at their convenience with their support network, including other care providers. Patient took the consent documents home to review.  Will contact patient on Monday (09/05/21) to determine interest.  Wells Guiles 'Donell Sievert, RN, BSN Clinical Research Nurse I 09/01/21 4:12 PM

## 2021-09-05 ENCOUNTER — Telehealth: Payer: Self-pay | Admitting: Emergency Medicine

## 2021-09-05 NOTE — Telephone Encounter (Signed)
MTG-015 - Tissue and Bodily Fluids: Translational Medicine: Discovery and Evaluation of Biomarkers/Pharmacogenomics for the Diagnosis and Personalized Management of Patients   Called pt to f/u on potential interest in MT study.  Patient states she was reading the consents now and had not made a decision yet.  Agreed to f/u after her RT appt tomorrow morning, denies any questions/concerns at this time.  Wells Guiles 'Learta CoddingNeysa Bonito, RN, BSN Clinical Research Nurse I 09/05/21 12:06 PM

## 2021-09-06 ENCOUNTER — Inpatient Hospital Stay: Payer: Medicare Other | Admitting: Emergency Medicine

## 2021-09-06 ENCOUNTER — Ambulatory Visit
Admission: RE | Admit: 2021-09-06 | Discharge: 2021-09-06 | Disposition: A | Discharge status: Home / Self Care | Payer: Medicare Other | Source: Ambulatory Visit | Attending: Radiation Oncology | Admitting: Radiation Oncology

## 2021-09-06 ENCOUNTER — Other Ambulatory Visit: Payer: Self-pay | Admitting: Emergency Medicine

## 2021-09-06 ENCOUNTER — Encounter: Payer: Managed Care, Other (non HMO) | Admitting: Family Medicine

## 2021-09-06 ENCOUNTER — Other Ambulatory Visit: Payer: Self-pay | Admitting: Hematology and Oncology

## 2021-09-06 ENCOUNTER — Inpatient Hospital Stay: Payer: Medicare Other

## 2021-09-06 ENCOUNTER — Other Ambulatory Visit: Payer: Self-pay

## 2021-09-06 ENCOUNTER — Telehealth: Payer: Self-pay

## 2021-09-06 DIAGNOSIS — C539 Malignant neoplasm of cervix uteri, unspecified: Secondary | ICD-10-CM

## 2021-09-06 DIAGNOSIS — Z5111 Encounter for antineoplastic chemotherapy: Secondary | ICD-10-CM | POA: Diagnosis not present

## 2021-09-06 LAB — CBC WITH DIFFERENTIAL (CANCER CENTER ONLY)
Abs Immature Granulocytes: 0.02 10*3/uL (ref 0.00–0.07)
Basophils Absolute: 0.1 10*3/uL (ref 0.0–0.1)
Basophils Relative: 1 %
Eosinophils Absolute: 0.5 10*3/uL (ref 0.0–0.5)
Eosinophils Relative: 6 %
HCT: 39.9 % (ref 36.0–46.0)
Hemoglobin: 13.4 g/dL (ref 12.0–15.0)
Immature Granulocytes: 0 %
Lymphocytes Relative: 19 %
Lymphs Abs: 1.7 10*3/uL (ref 0.7–4.0)
MCH: 32.3 pg (ref 26.0–34.0)
MCHC: 33.6 g/dL (ref 30.0–36.0)
MCV: 96.1 fL (ref 80.0–100.0)
Monocytes Absolute: 0.6 10*3/uL (ref 0.1–1.0)
Monocytes Relative: 6 %
Neutro Abs: 6 10*3/uL (ref 1.7–7.7)
Neutrophils Relative %: 68 %
Platelet Count: 322 10*3/uL (ref 150–400)
RBC: 4.15 MIL/uL (ref 3.87–5.11)
RDW: 13.6 % (ref 11.5–15.5)
WBC Count: 8.7 10*3/uL (ref 4.0–10.5)
nRBC: 0 % (ref 0.0–0.2)

## 2021-09-06 LAB — MAGNESIUM: Magnesium: 1.8 mg/dL (ref 1.7–2.4)

## 2021-09-06 LAB — BASIC METABOLIC PANEL - CANCER CENTER ONLY
Anion gap: 10 (ref 5–15)
BUN: 23 mg/dL (ref 8–23)
CO2: 24 mmol/L (ref 22–32)
Calcium: 9.6 mg/dL (ref 8.9–10.3)
Chloride: 104 mmol/L (ref 98–111)
Creatinine: 0.84 mg/dL (ref 0.44–1.00)
GFR, Estimated: >60 mL/min (ref >60)
Glucose, Bld: 99 mg/dL (ref 70–99)
Potassium: 4 mmol/L (ref 3.5–5.1)
Sodium: 138 mmol/L (ref 135–145)

## 2021-09-06 NOTE — Research (Signed)
MTG-015 - Tissue and Bodily Fluids: Translational Medicine: Discovery and Evaluation of Biomarkers/Pharmacogenomics for the Diagnosis and Personalized Management of Patients    MT 3505-A  This Nurse has reviewed this patient's inclusion and exclusion criteria as a second eligibility check and confirmed Taylor Buchanan is eligible for study participation. Patient will continue with enrollment.  Vickii Penna, RN, BSN, CPN Clinical Research Nurse I [phone]  09/06/2021 10:45 AM

## 2021-09-06 NOTE — Telephone Encounter (Signed)
FYI. Patient has moved CPE from January to June due to having cancer and going through treatments.  Patient wants to make sure she will still be able to received refills on medications until this time.

## 2021-09-06 NOTE — Research (Signed)
MTG-015 - Tissue and Bodily Fluids: Translational Medicine: Discovery and Evaluation of Biomarkers/Pharmacogenomics for the Diagnosis and Personalized Management of Patients    MT 3505-A   This Nurse has reviewed this patient's inclusion and exclusion criteria and confirmed Taylor Buchanan is eligible for study participation.  Patient will continue with enrollment.   Patient agreed to follow up call at 12-14 months post enrollment.   Menopausal status (women only): Taylor Buchanan is post-menopausal with LMP of 08/21/2002.   Confirmed verbally the following with the patient today before consent: - Patient has never had treatment for this or any previous cancer. - Patient has no prior cancer diagnosis. - Patient has not had a blood transfusion within the last 30 days. - Patient has not any non-cancer system immune modulation therapy within last 60 days. - Patient has no history of organ transplant, current or prior pregnancy within last 12 months. - Patient has ability to give informed consent. - Patient has no current active Covid-19 infection. - Patient has no history/current HIV, Hepatitis (A/D/E), TB, prion disorder, or other infectious pathogen.   Reviewed medications, allergies, and history with patient and updated as needed.  Wells Guiles 'Learta CoddingNeysa Bonito, RN, BSN Clinical Research Nurse I 09/06/21 10:17 AM

## 2021-09-06 NOTE — Progress Notes (Signed)
Pharmacist Chemotherapy Monitoring - Initial Assessment    Anticipated start date: 09/13/21   The following has been reviewed per standard work regarding the patient's treatment regimen: The patient's diagnosis, treatment plan and drug doses, and organ/hematologic function Lab orders and baseline tests specific to treatment regimen  The treatment plan start date, drug sequencing, and pre-medications Prior authorization status  Patient's documented medication list, including drug-drug interaction screen and prescriptions for anti-emetics and supportive care specific to the treatment regimen The drug concentrations, fluid compatibility, administration routes, and timing of the medications to be used The patient's access for treatment and lifetime cumulative dose history, if applicable  The patient's medication allergies and previous infusion related reactions, if applicable   Changes made to treatment plan:  N/A  Follow up needed:  Pending authorization for treatment    Larene Beach, [place], 09/06/2021  2:33 PM

## 2021-09-06 NOTE — Research (Signed)
MTG-015 - Tissue and Bodily Fluids: Translational Medicine: Discovery and Evaluation of Biomarkers/Pharmacogenomics for the Diagnosis and Personalized Management of Patients   MT 3505-A  Patient Taylor Buchanan was identified by MD Alvy Bimler as a potential candidate for the above listed study.  This Clinical Research Nurse met with ARBUTUS NELLIGAN, [id number] on 09/06/21 in a manner and location that ensures patient privacy to discuss participation in the above listed research study.  Patient is Unaccompanied.  Patient was previously provided with informed consent documents.  Patient confirmed they have read the informed consent documents.  As outlined in the informed consent form, this Nurse and [name] discussed the purpose of the research study, the investigational nature of the study, study procedures and requirements for study participation, potential risks and benefits of study participation, as well as alternatives to participation.  This study is not blinded or double-blinded. The patient understands participation is voluntary and they may withdraw from study participation at any time.  This study does not involve randomization.  This study does not involve an investigational drug or device. This study does not involve a placebo. Patient understands enrollment is pending full eligibility review.   Confidentiality and how the patient's information will be used as part of study participation were discussed.  Patient was informed there is reimbursement provided for their time and effort spent on trial participation.  The patient is encouraged to discuss research study participation with their insurance provider to determine what costs they may incur as part of study participation, including research related injury.    All questions were answered to patient's satisfaction.  The informed consent and separate HIPAA Authorization was reviewed page by page.  The patient's mental and emotional status is  appropriate to provide informed consent, and the patient verbalizes an understanding of study participation.  Patient has agreed to participate in the above listed research study and has voluntarily signed the informed consent version date 07/11/2021 and separate HIPAA Authorization, version date 07/11/2021 (Version 5, revised 08/06/2019)  on 09/06/21 at 9:14AM.  The patient was provided with a copy of the signed informed consent form with embedded HIPAA language for their reference.  No study specific procedures were obtained prior to the signing of the informed consent document.  Approximately 30 minutes were spent with the patient reviewing the informed consent documents.  Patient was not requested to complete a Release of Information form.  Patient returning on 09/12/2021 to have blood drawn and to receive $50.00 gift card.  Wells Guiles 'Learta CoddingNeysa Bonito, RN, BSN Clinical Research Nurse I 09/06/21 10:18 AM

## 2021-09-07 ENCOUNTER — Telehealth: Payer: Self-pay | Admitting: Oncology

## 2021-09-07 ENCOUNTER — Other Ambulatory Visit: Payer: Self-pay | Admitting: Internal Medicine

## 2021-09-07 NOTE — Telephone Encounter (Signed)
Called Taylor Buchanan and advised her of new weekly appointments for labs before radiation and to see Dr. Alvy Bimler afterwards.  She was able to see the appointments in [place].    Also reviewed instructions for her port appointment tomorrow.

## 2021-09-08 ENCOUNTER — Ambulatory Visit (HOSPITAL_COMMUNITY)
Admission: RE | Admit: 2021-09-08 | Discharge: 2021-09-08 | Disposition: A | Discharge status: Home / Self Care | Payer: Medicare Other | Source: Ambulatory Visit | Attending: Hematology and Oncology | Admitting: Hematology and Oncology

## 2021-09-08 ENCOUNTER — Other Ambulatory Visit: Payer: Self-pay

## 2021-09-08 ENCOUNTER — Other Ambulatory Visit: Payer: Self-pay | Admitting: Radiology

## 2021-09-08 DIAGNOSIS — F32A Depression, unspecified: Secondary | ICD-10-CM | POA: Insufficient documentation

## 2021-09-08 DIAGNOSIS — K219 Gastro-esophageal reflux disease without esophagitis: Secondary | ICD-10-CM | POA: Diagnosis not present

## 2021-09-08 DIAGNOSIS — M419 Scoliosis, unspecified: Secondary | ICD-10-CM | POA: Diagnosis not present

## 2021-09-08 DIAGNOSIS — E785 Hyperlipidemia, unspecified: Secondary | ICD-10-CM | POA: Insufficient documentation

## 2021-09-08 DIAGNOSIS — F411 Generalized anxiety disorder: Secondary | ICD-10-CM | POA: Insufficient documentation

## 2021-09-08 DIAGNOSIS — C539 Malignant neoplasm of cervix uteri, unspecified: Secondary | ICD-10-CM | POA: Diagnosis present

## 2021-09-08 DIAGNOSIS — Z8673 Personal history of transient ischemic attack (TIA), and cerebral infarction without residual deficits: Secondary | ICD-10-CM | POA: Diagnosis not present

## 2021-09-08 HISTORY — PX: IR IMAGING GUIDED PORT INSERTION: IMG5740

## 2021-09-08 MED ORDER — HEPARIN SOD (PORK) LOCK FLUSH 100 UNIT/ML IV SOLN
INTRAVENOUS | Status: AC
  Filled 2021-09-08: qty 5

## 2021-09-08 MED ORDER — FENTANYL CITRATE (PF) 100 MCG/2ML IJ SOLN
INTRAMUSCULAR | Status: AC | PRN
  Administered 2021-09-08: 25 ug via INTRAVENOUS

## 2021-09-08 MED ORDER — SODIUM CHLORIDE 0.9 % IV SOLN: INTRAVENOUS | Status: DC

## 2021-09-08 MED ORDER — FENTANYL CITRATE (PF) 100 MCG/2ML IJ SOLN
INTRAMUSCULAR | Status: AC
  Filled 2021-09-08: qty 2

## 2021-09-08 MED ORDER — MIDAZOLAM HCL 2 MG/2ML IJ SOLN
INTRAMUSCULAR | Status: AC | PRN
  Administered 2021-09-08: 1 mg via INTRAVENOUS

## 2021-09-08 MED ORDER — MIDAZOLAM HCL 2 MG/2ML IJ SOLN
INTRAMUSCULAR | Status: AC
  Filled 2021-09-08: qty 2

## 2021-09-08 MED ORDER — LIDOCAINE-EPINEPHRINE 1 %-1:100000 IJ SOLN
INTRAMUSCULAR | Status: AC
  Filled 2021-09-08: qty 1

## 2021-09-08 NOTE — H&P (Signed)
Chief Complaint: Patient was seen in consultation today for port-a-catheter placement  at the request of [place]  Referring Physician(s): Heath Lark  Supervising Physician: Sharl Ma  Patient Status: [facility] - Out-pt  History of Present Illness: Taylor Buchanan is a 66 y.o. female with PMH of arthritis, cervical cancer, depression, GAD, GERD, TIA, HLD, and scoliosis. Pt was referred to IR for port-a-catheter placement to start chemotherapy for recent diagnosis of cervical cancer.   Past Medical History:  Diagnosis Date   Abnormal Pap smear of cervix    Arthritis    Cervical cancer (HCC)    Depression    GAD (generalized anxiety disorder)    GERD (gastroesophageal reflux disease)    History of migraine    History of transient ischemic attack (TIA) 01/2021   neruologist--- dr Leonie Man;  ad[facility]/ 2022 in [facility] ,  per note complicated migraine vs anxiety,  less likely TIA   HLD (hyperlipidemia)    Osteoporosis    Pain disorder    Scoliosis     Past Surgical History:  Procedure Laterality Date   CERVICAL CONIZATION W/BX N/A 08/04/2021   Procedure: CONIZATION CERVIX WITH BIOPSY: DILATION AND CURETTAGE OF UTERUS;  Surgeon: Sherlyn Hay, DO;  Location: [place];  Service: Gynecology;  Laterality: N/A;   CHOLECYSTECTOMY OPEN  2005   approx   TENDON REPAIR Left 2008   Ankle/ foot    Allergies: Patient has no known allergies.  Medications: Prior to Admission medications   Medication Sig Start Date End Date Taking? Authorizing Provider  alendronate (FOSAMAX) 70 MG tablet Take 1 tablet (70 mg total) by mouth once a week. Take with a full glass of water on an empty stomach and sit upright for 30 minutes. Patient taking differently: Take 70 mg by mouth every Sunday. Take with a full glass of water on an empty stomach and sit upright for 30 minutes. Sunday's 05/31/21  Yes Leamon Arnt, MD  aspirin EC 81 MG EC tablet Take 1 tablet (81 mg total) by  mouth daily. Swallow whole. 02/01/21  Yes Wouk, Ailene Rud, MD  atorvastatin (LIPITOR) 40 MG tablet Take 1 tablet (40 mg total) by mouth at bedtime. Patient taking differently: Take 40 mg by mouth daily. 05/31/21  Yes Leamon Arnt, MD  cholecalciferol (VITAMIN D) 1000 UNITS tablet Take 1,000 Units by mouth daily.   Yes [provider]  escitalopram (LEXAPRO) 10 MG tablet Take 1 tablet (10 mg total) by mouth daily. 05/18/21  Yes Leamon Arnt, MD  pantoprazole (PROTONIX) 20 MG tablet Take 1 tablet (20 mg total) by mouth daily. 08/23/21  Yes Leamon Arnt, MD  acetaminophen (TYLENOL) 500 MG tablet Take 500 mg by mouth as needed.    [provider]  ALPRAZolam Duanne Moron) 0.5 MG tablet Take 0.5-1 tablets (0.25-0.5 mg total) by mouth daily as needed for anxiety (panic symptoms). 08/09/21   Leamon Arnt, MD  BINAXNOW COVID-19 AG HOME TEST KIT TEST AS DIRECTED TODAY 05/31/21   [provider]  Calcium Carbonate (CALCIUM-CARB 600 PO) Take 1 tablet by mouth daily.    [provider]  lidocaine-prilocaine (EMLA) cream Apply to affected area once 09/01/21   Heath Lark, MD  ondansetron (ZOFRAN) 8 MG tablet Take 1 tablet by mouth every 8  hours as needed. 09/01/21   Heath Lark, MD  prochlorperazine (COMPAZINE) 10 MG tablet Take 1 tablet  by mouth every 6 hours as needed (Nausea or vomiting). 09/01/21  Heath Lark, MD     Family History  Problem Relation Age of Onset   Mental illness Mother    Stroke Mother    Dementia Mother    Alzheimer's disease Mother    Bladder Cancer Father    Healthy Brother    Healthy Brother    Healthy Brother    CAD Maternal Grandmother    CAD Maternal Grandfather    Healthy Son    Healthy Son    Colon cancer Neg Hx    Breast cancer Neg Hx    Ovarian cancer Neg Hx    Endometrial cancer Neg Hx    Pancreatic cancer Neg Hx    Prostate cancer Neg Hx     Social History   Socioeconomic History   Marital status: Married    Spouse  name: Not on file   Number of children: 2   Years of education: Not on file   Highest education level: Not on file  Occupational History    Employer: [employer]    Comment: retired   Occupation: retired  Tobacco Use   Smoking status: Former    Packs/day: 1.00    Years: 15.00    Pack years: 15.00    Types: Cigarettes    Quit date: 1997    Years since quitting: 26.0   Smokeless tobacco: Never  Vaping Use   Vaping Use: Never used  Substance and Sexual Activity   Alcohol use: Yes    Alcohol/week: 3.0 standard drinks    Types: 3 Standard drinks or equivalent per week    Comment: occ   Drug use: Never   Sexual activity: Yes    Birth control/protection: Post-menopausal  Other Topics Concern   Not on file  Social History Narrative   Work or School: Paediatric nurse [employer]      Home Situation: lives with husband      Spiritual Beliefs: Christian      Lifestyle: no regular exercise; diet good            Social Determinants of Radio broadcast assistant Strain: Not on file  Food Insecurity: Not on file  Transportation Needs: Not on file  Physical Activity: Not on file  Stress: Not on file  Social Connections: Not on file     Review of Systems: A 12 point ROS discussed and pertinent positives are indicated in the HPI above.  All other systems are negative.  Review of Systems  All other systems reviewed and are negative.  Vital Signs: BP 115/79    Pulse 67    Temp 98.3 F (36.8 C) (Oral)    Resp 18    Ht 5' 6.5" (1.689 m)    Wt 163 lb (73.9 kg)    LMP 08/21/2002    SpO2 97%    BMI 25.91 kg/m   Physical Exam Vitals reviewed.  Constitutional:      Appearance: Normal appearance. She is not ill-appearing.  HENT:     Mouth/Throat:     Mouth: Mucous membranes are moist.     Pharynx: Oropharynx is clear.  Cardiovascular:     Rate and Rhythm: Normal rate and regular rhythm.     Pulses: Normal pulses.     Heart sounds: Normal heart sounds. No murmur  heard.   No friction rub. No gallop.  Pulmonary:     Effort: Pulmonary effort is normal. No respiratory distress.     Breath sounds: Normal breath sounds. No stridor. No  wheezing, rhonchi or rales.  Abdominal:     General: Bowel sounds are normal. There is no distension.     Palpations: Abdomen is soft.     Tenderness: There is no abdominal tenderness. There is no guarding.  Skin:    General: Skin is warm and dry.  Neurological:     Mental Status: She is alert and oriented to person, place, and time.  Psychiatric:        Mood and Affect: Mood normal.        Behavior: Behavior normal.        Thought Content: Thought content normal.        Judgment: Judgment normal.    Imaging: NM PET Image Initial (PI) Skull Base To Thigh  Result Date: 08/30/2021 CLINICAL DATA:  Initial treatment strategy for cervical carcinoma. EXAM: NUCLEAR MEDICINE PET SKULL BASE TO THIGH TECHNIQUE: 8.1 mCi F-18 FDG was injected intravenously. Full-ring PET imaging was performed from the skull base to thigh after the radiotracer. CT data was obtained and used for attenuation correction and anatomic localization. Fasting blood glucose: 95 mg/dl COMPARISON:  None. FINDINGS: Mediastinal blood-pool activity (background): SUV max = 2.5 Liver activity (reference): SUV max = N/A NECK:  No hypermetabolic lymph nodes or masses. Incidental CT findings:  None. CHEST: No hypermetabolic masses or lymphadenopathy. No suspicious pulmonary nodules seen on CT images. Incidental CT findings:  None. ABDOMEN/PELVIS: Focal intense hypermetabolism is seen in the region of the cervix, with SUV max of 9.7, consistent with known primary cervical carcinoma. No hypermetabolic lymph nodes in the pelvis or abdomen.No abnormal hypermetabolic activity within the liver, pancreas, adrenal glands, or spleen. Incidental CT findings: Nonobstructive 3 mm left renal calculus. No evidence of hydronephrosis. SKELETON: No focal hypermetabolic bone lesions to  suggest skeletal metastasis. Incidental CT findings: Thoracolumbar levoscoliosis and degenerative changes. IMPRESSION: Focal hypermetabolism in the region of the cervix, consistent with known primary cervical carcinoma. No evidence of pelvic lymph node or distant metastatic disease. Electronically Signed   By: Marlaine Hind M.D.   On: 08/30/2021 16:43    Labs:  CBC: Recent Labs    01/30/21 0603 04/25/21 0920 08/04/21 0828 09/06/21 0855  WBC 9.0 9.5 7.3 8.7  HGB 13.5 14.9 13.8 13.4  HCT 39.5 44.3 42.5 39.9  PLT 334 358 301 322    COAGS: Recent Labs    01/29/21 2023  INR 0.9    BMP: Recent Labs    01/29/21 2023 01/30/21 0603 04/25/21 0920 04/27/21 1533 09/06/21 0855  NA 137  --  140 139 138  K 3.6  --  3.4* 3.7 4.0  CL 102  --  104 104 104  CO2 23  --  '24 25 24  ' GLUCOSE 91  --  115* 85 99  BUN 17  --  '18 16 23  ' CALCIUM 9.6  --  9.7 9.8 9.6  CREATININE 0.78 0.77 0.76 0.79 0.84  GFRNONAA >60 >60 >60  --  >60    LIVER FUNCTION TESTS: Recent Labs    01/29/21 2023 04/25/21 0920 04/27/21 1533  BILITOT 0.4 0.8 0.7  AST 23 32 24  ALT '12 28 21  ' ALKPHOS 72 84 81  PROT 7.3 7.9 7.7  ALBUMIN 4.4 4.4 4.5    TUMOR MARKERS: No results for input(s): AFPTM, CEA, CA199, CHROMGRNA in the last 8760 hours.  Assessment and Plan: History of arthritis, cervical cancer, depression, GAD, GERD, TIA, HLD, and scoliosis. Pt was referred to IR for port-a-catheter placement to start  chemotherapy for recent diagnosis of cervical cancer.   Pt resting on stretcher. She is A&O, calm and pleasant.  She is NPO per order. No labs needed today.   Risks and benefits of image guided port-a-catheter placement was discussed with the patient including, but not limited to bleeding, infection, pneumothorax, or fibrin sheath development and need for additional procedures.  All of the patient's questions were answered, patient is agreeable to proceed. Consent signed and in chart.   Thank you for  this interesting consult.  I greatly enjoyed meeting Taylor Buchanan and look forward to participating in their care.  A copy of this report was sent to the requesting provider on this date.  Electronically Signed: Tyson Alias, NP 09/08/2021, 9:25 AM   I spent a total of 20 minutes in face to face in clinical consultation, greater than 50% of which was counseling/coordinating care for port-a-catheter placement.

## 2021-09-08 NOTE — Procedures (Signed)
Pre Procedure Dx: Poor venous access Post Procedural Dx: Same  Successful placement of right IJ approach port-a-cath with tip at the superior caval atrial junction. The catheter is ready for immediate use.  Estimated Blood Loss: Trace  Complications: None immediate.  Jay Viviene Thurston, MD Pager #: [phone]   

## 2021-09-09 ENCOUNTER — Telehealth: Payer: Self-pay | Admitting: Emergency Medicine

## 2021-09-09 ENCOUNTER — Ambulatory Visit (HOSPITAL_COMMUNITY): Payer: Medicare Other

## 2021-09-09 ENCOUNTER — Telehealth: Payer: Self-pay | Admitting: Oncology

## 2021-09-09 DIAGNOSIS — Z5111 Encounter for antineoplastic chemotherapy: Secondary | ICD-10-CM | POA: Diagnosis not present

## 2021-09-09 MED FILL — Fosaprepitant Dimeglumine For IV Infusion 150 MG (Base Eq): INTRAVENOUS | Qty: 5 | Status: AC

## 2021-09-09 MED FILL — Dexamethasone Sodium Phosphate Inj 100 MG/10ML: INTRAMUSCULAR | Qty: 1 | Status: AC

## 2021-09-09 NOTE — Telephone Encounter (Signed)
MTG-015 - Tissue and Bodily Fluids: Translational Medicine: Discovery and Evaluation of Biomarkers/Pharmacogenomics for the Diagnosis and Personalized Management of Patients   Called pt to request moving her lab appt time from 4pm to 3:30 pm on Monday (09/12/21).  Pt approves, changed appt time.  Wells Guiles 'Learta CoddingNeysa Bonito, RN, BSN Clinical Research Nurse I 09/09/21 2:43 PM

## 2021-09-09 NOTE — Telephone Encounter (Signed)
Taylor Buchanan sent an email with questions about her port.  Advised her that she can remove the dressing 24 hours after her port placement.  She can shower after the bandage is removed but instructed not to rub or scrub the area.  Advised her it she does not need to put on a new dressing after she removes.  Also let her know she can take Tylenol as needed.

## 2021-09-12 ENCOUNTER — Ambulatory Visit
Admission: RE | Admit: 2021-09-12 | Discharge: 2021-09-12 | Disposition: A | Discharge status: Home / Self Care | Payer: Medicare Other | Source: Ambulatory Visit | Attending: Radiation Oncology | Admitting: Radiation Oncology

## 2021-09-12 ENCOUNTER — Other Ambulatory Visit: Payer: Self-pay

## 2021-09-12 ENCOUNTER — Inpatient Hospital Stay: Payer: Medicare Other

## 2021-09-12 DIAGNOSIS — Z5111 Encounter for antineoplastic chemotherapy: Secondary | ICD-10-CM | POA: Diagnosis not present

## 2021-09-12 DIAGNOSIS — C539 Malignant neoplasm of cervix uteri, unspecified: Secondary | ICD-10-CM

## 2021-09-12 LAB — CBC WITH DIFFERENTIAL (CANCER CENTER ONLY)
Abs Immature Granulocytes: 0.02 10*3/uL (ref 0.00–0.07)
Basophils Absolute: 0.1 10*3/uL (ref 0.0–0.1)
Basophils Relative: 1 %
Eosinophils Absolute: 0.9 10*3/uL — ABNORMAL HIGH (ref 0.0–0.5)
Eosinophils Relative: 11 %
HCT: 40.3 % (ref 36.0–46.0)
Hemoglobin: 13.5 g/dL (ref 12.0–15.0)
Immature Granulocytes: 0 %
Lymphocytes Relative: 28 %
Lymphs Abs: 2.4 10*3/uL (ref 0.7–4.0)
MCH: 32.2 pg (ref 26.0–34.0)
MCHC: 33.5 g/dL (ref 30.0–36.0)
MCV: 96.2 fL (ref 80.0–100.0)
Monocytes Absolute: 0.5 10*3/uL (ref 0.1–1.0)
Monocytes Relative: 5 %
Neutro Abs: 4.8 10*3/uL (ref 1.7–7.7)
Neutrophils Relative %: 55 %
Platelet Count: 268 10*3/uL (ref 150–400)
RBC: 4.19 MIL/uL (ref 3.87–5.11)
RDW: 13.2 % (ref 11.5–15.5)
WBC Count: 8.7 10*3/uL (ref 4.0–10.5)
nRBC: 0 % (ref 0.0–0.2)

## 2021-09-12 LAB — BASIC METABOLIC PANEL - CANCER CENTER ONLY
Anion gap: 8 (ref 5–15)
BUN: 18 mg/dL (ref 8–23)
CO2: 25 mmol/L (ref 22–32)
Calcium: 9.7 mg/dL (ref 8.9–10.3)
Chloride: 106 mmol/L (ref 98–111)
Creatinine: 0.8 mg/dL (ref 0.44–1.00)
GFR, Estimated: >60 mL/min (ref >60)
Glucose, Bld: 103 mg/dL — ABNORMAL HIGH (ref 70–99)
Potassium: 3.7 mmol/L (ref 3.5–5.1)
Sodium: 139 mmol/L (ref 135–145)

## 2021-09-12 LAB — RESEARCH LABS

## 2021-09-12 LAB — MAGNESIUM: Magnesium: 1.8 mg/dL (ref 1.7–2.4)

## 2021-09-13 ENCOUNTER — Ambulatory Visit
Admission: RE | Admit: 2021-09-13 | Discharge: 2021-09-13 | Disposition: A | Discharge status: Home / Self Care | Payer: Medicare Other | Source: Ambulatory Visit | Attending: Radiation Oncology | Admitting: Radiation Oncology

## 2021-09-13 ENCOUNTER — Inpatient Hospital Stay: Payer: Medicare Other

## 2021-09-13 ENCOUNTER — Telehealth: Payer: Self-pay | Admitting: Emergency Medicine

## 2021-09-13 VITALS — BP 118/77 | HR 63 | Temp 98.2°F | Resp 16 | Ht 66.5 in | Wt 166.2 lb

## 2021-09-13 DIAGNOSIS — C539 Malignant neoplasm of cervix uteri, unspecified: Secondary | ICD-10-CM

## 2021-09-13 DIAGNOSIS — Z5111 Encounter for antineoplastic chemotherapy: Secondary | ICD-10-CM | POA: Diagnosis not present

## 2021-09-13 MED ORDER — MAGNESIUM SULFATE 2 GM/50ML IV SOLN
2.0000 g | Freq: Once | INTRAVENOUS | Status: AC
  Administered 2021-09-13: 08:00:00 2 g via INTRAVENOUS
  Filled 2021-09-13: qty 50

## 2021-09-13 MED ORDER — HEPARIN SOD (PORK) LOCK FLUSH 100 UNIT/ML IV SOLN
500.0000 [IU] | Freq: Once | INTRAVENOUS | Status: AC | PRN
  Administered 2021-09-13: 15:00:00 500 [IU]

## 2021-09-13 MED ORDER — SODIUM CHLORIDE 0.9 % IV SOLN: Freq: Once | INTRAVENOUS | Status: AC

## 2021-09-13 MED ORDER — SODIUM CHLORIDE 0.9 % IV SOLN
10.0000 mg | Freq: Once | INTRAVENOUS | Status: AC
  Administered 2021-09-13: 10:00:00 10 mg via INTRAVENOUS
  Filled 2021-09-13: qty 10

## 2021-09-13 MED ORDER — SODIUM CHLORIDE 0.9 % IV SOLN
10.0000 mg | Freq: Once | INTRAVENOUS | Status: DC
  Filled 2021-09-13: qty 1

## 2021-09-13 MED ORDER — POTASSIUM CHLORIDE IN NACL 20-0.9 MEQ/L-% IV SOLN
Freq: Once | INTRAVENOUS | Status: AC
  Filled 2021-09-13: qty 1000

## 2021-09-13 MED ORDER — SODIUM CHLORIDE 0.9 % IV SOLN
150.0000 mg | Freq: Once | INTRAVENOUS | Status: AC
  Administered 2021-09-13: 10:00:00 150 mg via INTRAVENOUS
  Filled 2021-09-13: qty 150

## 2021-09-13 MED ORDER — SODIUM CHLORIDE 0.9 % IV SOLN
150.0000 mg | Freq: Once | INTRAVENOUS | Status: DC
  Filled 2021-09-13: qty 5

## 2021-09-13 MED ORDER — PALONOSETRON HCL INJECTION 0.25 MG/5ML
0.2500 mg | Freq: Once | INTRAVENOUS | Status: AC
  Administered 2021-09-13: 10:00:00 0.25 mg via INTRAVENOUS
  Filled 2021-09-13: qty 5

## 2021-09-13 MED ORDER — SODIUM CHLORIDE 0.9% FLUSH
10.0000 mL | INTRAVENOUS | Status: DC | PRN
  Administered 2021-09-13: 15:00:00 10 mL

## 2021-09-13 MED ORDER — SODIUM CHLORIDE 0.9 % IV SOLN
40.0000 mg/m2 | Freq: Once | INTRAVENOUS | Status: AC
  Administered 2021-09-13: 11:00:00 74 mg via INTRAVENOUS
  Filled 2021-09-13: qty 74

## 2021-09-13 NOTE — Telephone Encounter (Addendum)
MTG-015 - Tissue and Bodily Fluids: Translational Medicine: Discovery and Evaluation of Biomarkers/Pharmacogenomics for the Diagnosis and Personalized Management of Patients   MT 3505-A  Patient provided blood sample today (09/12/21) (tolerated well) for study and was given $50.00 gift card.  Pt agreed to f/u phone call at 12-14 months post enrollment.  Pt denies any questions/concerns at this time.  Wells Guiles 'Learta CoddingNeysa Bonito, RN, BSN Clinical Research Nurse I 09/13/21 1:21 PM

## 2021-09-13 NOTE — Patient Instructions (Signed)
[facility]  Discharge Instructions: Thank you for choosing [facility] to provide your oncology and hematology care.   If you have a lab appointment with the [facility], please go directly to the [facility] and check in at the registration area.   Wear comfortable clothing and clothing appropriate for easy access to any Portacath or PICC line.   We strive to give you quality time with your provider. You may need to reschedule your appointment if you arrive late (15 or more minutes).  Arriving late affects you and other patients whose appointments are after yours.  Also, if you miss three or more appointments without notifying the office, you may be dismissed from the clinic at the providers discretion.      For prescription refill requests, have your pharmacy contact our office and allow 72 hours for refills to be completed.    Today you received the following chemotherapy and/or immunotherapy agent: Cisplatin   To help prevent nausea and vomiting after your treatment, we encourage you to take your nausea medication as directed.  BELOW ARE SYMPTOMS THAT SHOULD BE REPORTED IMMEDIATELY: *FEVER GREATER THAN 100.4 F (38 C) OR HIGHER *CHILLS OR SWEATING *NAUSEA AND VOMITING THAT IS NOT CONTROLLED WITH YOUR NAUSEA MEDICATION *UNUSUAL SHORTNESS OF BREATH *UNUSUAL BRUISING OR BLEEDING *URINARY PROBLEMS (pain or burning when urinating, or frequent urination) *BOWEL PROBLEMS (unusual diarrhea, constipation, pain near the anus) TENDERNESS IN MOUTH AND THROAT WITH OR WITHOUT PRESENCE OF ULCERS (sore throat, sores in mouth, or a toothache) UNUSUAL RASH, SWELLING OR PAIN  UNUSUAL VAGINAL DISCHARGE OR ITCHING   Items with * indicate a potential emergency and should be followed up as soon as possible or go to the Emergency Department if any problems should occur.  Please show the CHEMOTHERAPY ALERT CARD or IMMUNOTHERAPY ALERT CARD at check-in to the  Emergency Department and triage nurse.  Should you have questions after your visit or need to cancel or reschedule your appointment, please contact [facility]  Dept: [phone]  and follow the prompts.  Office hours are 8:00 a.m. to 4:30 p.m. Monday - Friday. Please note that voicemails left after 4:00 p.m. may not be returned until the following business day.  We are closed weekends and major holidays. You have access to a nurse at all times for urgent questions. Please call the main number to the clinic Dept: [phone] and follow the prompts.   For any non-urgent questions, you may also contact your provider using MyChart. We now offer e-Visits for anyone 48 and older to request care online for non-urgent symptoms. For details visit [email or web address].   Also download the MyChart app! Go to the app store, search "MyChart", open the app, select [facility], and log in with your MyChart username and password.  Due to Covid, a mask is required upon entering the hospital/clinic. If you do not have a mask, one will be given to you upon arrival. For doctor visits, patients may have 1 support person aged 66 or older with them. For treatment visits, patients cannot have anyone with them due to current Covid guidelines and our immunocompromised population.   Cisplatin injection What is this medication? CISPLATIN (SIS pla tin) is a chemotherapy drug. It targets fast dividing cells, like cancer cells, and causes these cells to die. This medicine is used to treat many types of cancer like bladder, ovarian, and testicular cancers. This medicine may be used for other purposes;  ask your health care provider or pharmacist if you have questions. COMMON BRAND NAME(S): Platinol, Platinol -AQ What should I tell my care team before I take this medication? They need to know if you have any of these conditions: eye disease, vision problems hearing problems kidney disease low  blood counts, like white cells, platelets, or red blood cells tingling of the fingers or toes, or other nerve disorder an unusual or allergic reaction to cisplatin, carboplatin, oxaliplatin, other medicines, foods, dyes, or preservatives pregnant or trying to get pregnant breast-feeding How should I use this medication? This drug is given as an infusion into a vein. It is administered in a hospital or clinic by a specially trained health care professional. Talk to your pediatrician regarding the use of this medicine in children. Special care may be needed. Overdosage: If you think you have taken too much of this medicine contact a poison control center or emergency room at once. NOTE: This medicine is only for you. Do not share this medicine with others. What if I miss a dose? It is important not to miss a dose. Call your doctor or health care professional if you are unable to keep an appointment. What may interact with this medication? This medicine may interact with the following medications: foscarnet certain antibiotics like amikacin, gentamicin, neomycin, polymyxin B, streptomycin, tobramycin, vancomycin This list may not describe all possible interactions. Give your health care provider a list of all the medicines, herbs, non-prescription drugs, or dietary supplements you use. Also tell them if you smoke, drink alcohol, or use illegal drugs. Some items may interact with your medicine. What should I watch for while using this medication? Your condition will be monitored carefully while you are receiving this medicine. You will need important blood work done while you are taking this medicine. This drug may make you feel generally unwell. This is not uncommon, as chemotherapy can affect healthy cells as well as cancer cells. Report any side effects. Continue your course of treatment even though you feel ill unless your doctor tells you to stop. This medicine may increase your risk of getting  an infection. Call your healthcare professional for advice if you get a fever, chills, or sore throat, or other symptoms of a cold or flu. Do not treat yourself. Try to avoid being around people who are sick. Avoid taking medicines that contain aspirin, acetaminophen, ibuprofen, naproxen, or ketoprofen unless instructed by your healthcare professional. These medicines may hide a fever. This medicine may increase your risk to bruise or bleed. Call your doctor or health care professional if you notice any unusual bleeding. Be careful brushing and flossing your teeth or using a toothpick because you may get an infection or bleed more easily. If you have any dental work done, tell your dentist you are receiving this medicine. Do not become pregnant while taking this medicine or for 14 months after stopping it. Women should inform their healthcare professional if they wish to become pregnant or think they might be pregnant. Men should not father a child while taking this medicine and for 11 months after stopping it. There is potential for serious side effects to an unborn child. Talk to your healthcare professional for more information. Do not breast-feed an infant while taking this medicine. This medicine has caused ovarian failure in some women. This medicine may make it more difficult to get pregnant. Talk to your healthcare professional if you are concerned about your fertility. This medicine has caused decreased sperm counts  in some men. This may make it more difficult to father a child. Talk to your healthcare professional if you are concerned about your fertility. Drink fluids as directed while you are taking this medicine. This will help protect your kidneys. Call your doctor or health care professional if you get diarrhea. Do not treat yourself. What side effects may I notice from receiving this medication? Side effects that you should report to your doctor or health care professional as soon as  possible: allergic reactions like skin rash, itching or hives, swelling of the face, lips, or tongue blurred vision changes in vision decreased hearing or ringing of the ears nausea, vomiting pain, redness, or irritation at site where injected pain, tingling, numbness in the hands or feet signs and symptoms of bleeding such as bloody or black, tarry stools; red or dark brown urine; spitting up blood or brown material that looks like coffee grounds; red spots on the skin; unusual bruising or bleeding from the eyes, gums, or nose signs and symptoms of infection like fever; chills; cough; sore throat; pain or trouble passing urine signs and symptoms of kidney injury like trouble passing urine or change in the amount of urine signs and symptoms of low red blood cells or anemia such as unusually weak or tired; feeling faint or lightheaded; falls; breathing problems Side effects that usually do not require medical attention (report to your doctor or health care professional if they continue or are bothersome): loss of appetite mouth sores muscle cramps This list may not describe all possible side effects. Call your doctor for medical advice about side effects. You may report side effects to FDA at 1-800-FDA-1088. Where should I keep my medication? This drug is given in a hospital or clinic and will not be stored at home. NOTE: This sheet is a summary. It may not cover all possible information. If you have questions about this medicine, talk to your doctor, pharmacist, or health care provider.  2022 Elsevier/Gold Standard (2021-04-26 00:00:00)

## 2021-09-14 ENCOUNTER — Telehealth: Payer: Self-pay

## 2021-09-14 ENCOUNTER — Ambulatory Visit
Admission: RE | Admit: 2021-09-14 | Discharge: 2021-09-14 | Disposition: A | Discharge status: Home / Self Care | Payer: Medicare Other | Source: Ambulatory Visit | Attending: Radiation Oncology | Admitting: Radiation Oncology

## 2021-09-14 ENCOUNTER — Other Ambulatory Visit: Payer: Self-pay

## 2021-09-14 DIAGNOSIS — Z5111 Encounter for antineoplastic chemotherapy: Secondary | ICD-10-CM | POA: Diagnosis not present

## 2021-09-14 NOTE — Telephone Encounter (Signed)
Taylor Buchanan states that she is doing very well. No N/V. She is eating and drinking well. She is experiencing some constipation. She took some Miralax for this today. Taylor Buchanan knows to call the [phone] if she has any questions or concerns.

## 2021-09-15 ENCOUNTER — Other Ambulatory Visit: Payer: Self-pay | Admitting: Radiation Oncology

## 2021-09-15 ENCOUNTER — Other Ambulatory Visit (HOSPITAL_COMMUNITY): Payer: Self-pay | Admitting: Radiation Oncology

## 2021-09-15 ENCOUNTER — Ambulatory Visit
Admission: RE | Admit: 2021-09-15 | Discharge: 2021-09-15 | Disposition: A | Discharge status: Home / Self Care | Payer: Medicare Other | Source: Ambulatory Visit | Attending: Radiation Oncology | Admitting: Radiation Oncology

## 2021-09-15 ENCOUNTER — Ambulatory Visit: Payer: Managed Care, Other (non HMO) | Admitting: Adult Health

## 2021-09-15 DIAGNOSIS — Z5111 Encounter for antineoplastic chemotherapy: Secondary | ICD-10-CM | POA: Diagnosis not present

## 2021-09-15 DIAGNOSIS — C539 Malignant neoplasm of cervix uteri, unspecified: Secondary | ICD-10-CM

## 2021-09-16 ENCOUNTER — Encounter: Payer: Managed Care, Other (non HMO) | Admitting: Family Medicine

## 2021-09-16 ENCOUNTER — Other Ambulatory Visit: Payer: Self-pay

## 2021-09-16 ENCOUNTER — Ambulatory Visit
Admission: RE | Admit: 2021-09-16 | Discharge: 2021-09-16 | Disposition: A | Discharge status: Home / Self Care | Payer: Medicare Other | Source: Ambulatory Visit | Attending: Radiation Oncology | Admitting: Radiation Oncology

## 2021-09-16 DIAGNOSIS — Z5111 Encounter for antineoplastic chemotherapy: Secondary | ICD-10-CM | POA: Diagnosis not present

## 2021-09-19 ENCOUNTER — Inpatient Hospital Stay (HOSPITAL_BASED_OUTPATIENT_CLINIC_OR_DEPARTMENT_OTHER): Payer: Medicare Other | Admitting: Hematology and Oncology

## 2021-09-19 ENCOUNTER — Other Ambulatory Visit: Payer: Self-pay

## 2021-09-19 ENCOUNTER — Ambulatory Visit
Admission: RE | Admit: 2021-09-19 | Discharge: 2021-09-19 | Disposition: A | Discharge status: Home / Self Care | Payer: Medicare Other | Source: Ambulatory Visit | Attending: Radiation Oncology | Admitting: Radiation Oncology

## 2021-09-19 ENCOUNTER — Inpatient Hospital Stay: Payer: Medicare Other

## 2021-09-19 DIAGNOSIS — C539 Malignant neoplasm of cervix uteri, unspecified: Secondary | ICD-10-CM

## 2021-09-19 DIAGNOSIS — R110 Nausea: Secondary | ICD-10-CM

## 2021-09-19 DIAGNOSIS — Z5111 Encounter for antineoplastic chemotherapy: Secondary | ICD-10-CM | POA: Diagnosis not present

## 2021-09-19 LAB — CBC WITH DIFFERENTIAL (CANCER CENTER ONLY)
Abs Immature Granulocytes: 0.04 10*3/uL (ref 0.00–0.07)
Basophils Absolute: 0.1 10*3/uL (ref 0.0–0.1)
Basophils Relative: 1 %
Eosinophils Absolute: 1.3 10*3/uL — ABNORMAL HIGH (ref 0.0–0.5)
Eosinophils Relative: 18 %
HCT: 42.4 % (ref 36.0–46.0)
Hemoglobin: 14.6 g/dL (ref 12.0–15.0)
Immature Granulocytes: 1 %
Lymphocytes Relative: 17 %
Lymphs Abs: 1.2 10*3/uL (ref 0.7–4.0)
MCH: 32 pg (ref 26.0–34.0)
MCHC: 34.4 g/dL (ref 30.0–36.0)
MCV: 93 fL (ref 80.0–100.0)
Monocytes Absolute: 0.4 10*3/uL (ref 0.1–1.0)
Monocytes Relative: 6 %
Neutro Abs: 4.1 10*3/uL (ref 1.7–7.7)
Neutrophils Relative %: 57 %
Platelet Count: 247 10*3/uL (ref 150–400)
RBC: 4.56 MIL/uL (ref 3.87–5.11)
RDW: 12.2 % (ref 11.5–15.5)
WBC Count: 7.1 10*3/uL (ref 4.0–10.5)
nRBC: 0 % (ref 0.0–0.2)

## 2021-09-19 LAB — MAGNESIUM: Magnesium: 1.7 mg/dL (ref 1.7–2.4)

## 2021-09-19 LAB — BASIC METABOLIC PANEL - CANCER CENTER ONLY
Anion gap: 9 (ref 5–15)
BUN: 18 mg/dL (ref 8–23)
CO2: 26 mmol/L (ref 22–32)
Calcium: 10 mg/dL (ref 8.9–10.3)
Chloride: 100 mmol/L (ref 98–111)
Creatinine: 0.88 mg/dL (ref 0.44–1.00)
GFR, Estimated: >60 mL/min (ref >60)
Glucose, Bld: 103 mg/dL — ABNORMAL HIGH (ref 70–99)
Potassium: 3.9 mmol/L (ref 3.5–5.1)
Sodium: 135 mmol/L (ref 135–145)

## 2021-09-19 MED FILL — Dexamethasone Sodium Phosphate Inj 100 MG/10ML: INTRAMUSCULAR | Qty: 1 | Status: AC

## 2021-09-19 MED FILL — Fosaprepitant Dimeglumine For IV Infusion 150 MG (Base Eq): INTRAVENOUS | Qty: 5 | Status: AC

## 2021-09-20 ENCOUNTER — Inpatient Hospital Stay: Payer: Medicare Other

## 2021-09-20 ENCOUNTER — Ambulatory Visit
Admission: RE | Admit: 2021-09-20 | Discharge: 2021-09-20 | Disposition: A | Discharge status: Home / Self Care | Payer: Medicare Other | Source: Ambulatory Visit | Attending: Radiation Oncology | Admitting: Radiation Oncology

## 2021-09-20 ENCOUNTER — Encounter: Payer: Self-pay | Admitting: Hematology and Oncology

## 2021-09-20 VITALS — BP 119/81 | HR 68 | Temp 97.8°F | Resp 17 | Wt 160.5 lb

## 2021-09-20 DIAGNOSIS — R110 Nausea: Secondary | ICD-10-CM | POA: Insufficient documentation

## 2021-09-20 DIAGNOSIS — Z5111 Encounter for antineoplastic chemotherapy: Secondary | ICD-10-CM | POA: Diagnosis not present

## 2021-09-20 DIAGNOSIS — C539 Malignant neoplasm of cervix uteri, unspecified: Secondary | ICD-10-CM

## 2021-09-20 MED ORDER — HEPARIN SOD (PORK) LOCK FLUSH 100 UNIT/ML IV SOLN
500.0000 [IU] | Freq: Once | INTRAVENOUS | Status: AC | PRN
  Administered 2021-09-20: 500 [IU]

## 2021-09-20 MED ORDER — SODIUM CHLORIDE 0.9% FLUSH
10.0000 mL | INTRAVENOUS | Status: DC | PRN
  Administered 2021-09-20: 10 mL

## 2021-09-20 MED ORDER — MAGNESIUM SULFATE 2 GM/50ML IV SOLN
2.0000 g | Freq: Once | INTRAVENOUS | Status: AC
  Administered 2021-09-20: 2 g via INTRAVENOUS
  Filled 2021-09-20: qty 50

## 2021-09-20 MED ORDER — SODIUM CHLORIDE 0.9 % IV SOLN: Freq: Once | INTRAVENOUS | Status: AC

## 2021-09-20 MED ORDER — SODIUM CHLORIDE 0.9 % IV SOLN
10.0000 mg | Freq: Once | INTRAVENOUS | Status: AC
  Administered 2021-09-20: 10 mg via INTRAVENOUS
  Filled 2021-09-20: qty 10

## 2021-09-20 MED ORDER — POTASSIUM CHLORIDE IN NACL 20-0.9 MEQ/L-% IV SOLN
Freq: Once | INTRAVENOUS | Status: AC
  Filled 2021-09-20: qty 1000

## 2021-09-20 MED ORDER — SODIUM CHLORIDE 0.9 % IV SOLN
150.0000 mg | Freq: Once | INTRAVENOUS | Status: AC
  Administered 2021-09-20: 150 mg via INTRAVENOUS
  Filled 2021-09-20: qty 150

## 2021-09-20 MED ORDER — PALONOSETRON HCL INJECTION 0.25 MG/5ML
0.2500 mg | Freq: Once | INTRAVENOUS | Status: AC
  Administered 2021-09-20: 0.25 mg via INTRAVENOUS
  Filled 2021-09-20: qty 5

## 2021-09-20 MED ORDER — SODIUM CHLORIDE 0.9 % IV SOLN
40.0000 mg/m2 | Freq: Once | INTRAVENOUS | Status: AC
  Administered 2021-09-20: 74 mg via INTRAVENOUS
  Filled 2021-09-20: qty 74

## 2021-09-20 NOTE — Assessment & Plan Note (Signed)
She had mild nausea Could be due to gastritis as well We discussed the use of antiacid's and antiemetics

## 2021-09-20 NOTE — Progress Notes (Signed)
[facility] OFFICE PROGRESS NOTE  Patient Care Team: Leamon Arnt, MD as PCP - General (Family Medicine) Key, Nelia Shi, NP as Nurse Practitioner (Gynecology) Lyndee Hensen, PT as Physical Therapist (Physical Therapy) Jacqulyn Liner, RN as Oncology Nurse Navigator (Oncology)  ASSESSMENT & PLAN:  Malignant neoplasm of cervix ([place]) Overall, she tolerated treatment well except for fatigue and mild nausea It is not clear whether her weight changes related to recent nausea We will monitor carefully We will proceed with treatment as scheduled I will see her again next week for further follow-up  Nausea without vomiting She had mild nausea Could be due to gastritis as well We discussed the use of antiacid's and antiemetics  No orders of the defined types were placed in this encounter.   All questions were answered. The patient knows to call the clinic with any problems, questions or concerns. The total time spent in the appointment was 20 minutes encounter with patients including review of chart and various tests results, discussions about plan of care and coordination of care plan   Heath Lark, MD 09/20/2021 7:54 AM  INTERVAL HISTORY: Please see below for problem oriented charting. she returns for treatment follow-up seen prior to cycle 2 of treatment She tolerated last cycle well Denies neuropathy or hearing changes Had mild fatigue She had nausea on day 2, resolved with Compazine She also complained of mild gastritis No changes in bowel habits  REVIEW OF SYSTEMS:   Constitutional: Denies fevers, chills or abnormal weight loss Eyes: Denies blurriness of vision Ears, nose, mouth, throat, and face: Denies mucositis or sore throat Respiratory: Denies cough, dyspnea or wheezes Cardiovascular: Denies palpitation, chest discomfort or lower extremity swelling Skin: Denies abnormal skin rashes Lymphatics: Denies new lymphadenopathy or easy  bruising Neurological:Denies numbness, tingling or new weaknesses Behavioral/Psych: Mood is stable, no new changes  All other systems were reviewed with the patient and are negative.  I have reviewed the past medical history, past surgical history, social history and family history with the patient and they are unchanged from previous note.  ALLERGIES:  has No Known Allergies.  MEDICATIONS:  Current Outpatient Medications  Medication Sig Dispense Refill   acetaminophen (TYLENOL) 500 MG tablet Take 500 mg by mouth as needed.     alendronate (FOSAMAX) 70 MG tablet Take 1 tablet (70 mg total) by mouth once a week. Take with a full glass of water on an empty stomach and sit upright for 30 minutes. (Patient taking differently: Take 70 mg by mouth every Sunday. Take with a full glass of water on an empty stomach and sit upright for 30 minutes. Sunday's) 12 tablet 3   ALPRAZolam (XANAX) 0.5 MG tablet Take 0.5-1 tablets (0.25-0.5 mg total) by mouth daily as needed for anxiety (panic symptoms). 30 tablet 0   aspirin EC 81 MG EC tablet Take 1 tablet (81 mg total) by mouth daily. Swallow whole. 30 tablet 11   atorvastatin (LIPITOR) 40 MG tablet Take 1 tablet (40 mg total) by mouth at bedtime. (Patient taking differently: Take 40 mg by mouth daily.) 90 tablet 3   BINAXNOW COVID-19 AG HOME TEST KIT TEST AS DIRECTED TODAY     Calcium Carbonate (CALCIUM-CARB 600 PO) Take 1 tablet by mouth daily.     cholecalciferol (VITAMIN D) 1000 UNITS tablet Take 1,000 Units by mouth daily.     escitalopram (LEXAPRO) 10 MG tablet Take 1 tablet (10 mg total) by mouth daily. 90 tablet 2   lidocaine-prilocaine (  EMLA) cream Apply to affected area once 30 g 3   ondansetron (ZOFRAN) 8 MG tablet Take 1 tablet by mouth every 8  hours as needed. 30 tablet 1   pantoprazole (PROTONIX) 20 MG tablet Take 1 tablet (20 mg total) by mouth daily. 90 tablet 3   prochlorperazine (COMPAZINE) 10 MG tablet Take 1 tablet  by mouth every 6  hours as needed (Nausea or vomiting). 30 tablet 1   No current facility-administered medications for this visit.    SUMMARY OF ONCOLOGIC HISTORY: Oncology History  Malignant neoplasm of cervix ([place])  07/05/2021 Pathology Results   A. ENDOMETRIAL CURETTINGS:  Blood clots containing numerous strips of squamous epithelium with features of high-grade SIL(CIN-3/severe dysplasia).  Normal endocervical tissue or endometrial tissue are not identified.   B. CERVIX, CONE BIOPSY:  Invasive squamous cell carcinoma, focally keratinizing and moderately differentiated with circumferential involvement.  All margins including the deep margin are positive for squamous cell carcinoma.  Please see comment. P16 positive   08/16/2021 Initial Diagnosis   Malignant neoplasm of cervix ([place])   08/30/2021 PET scan   Focal hypermetabolism in the region of the cervix, consistent with known primary cervical carcinoma.   No evidence of pelvic lymph node or distant metastatic disease.     09/01/2021 Cancer Staging   Staging form: Cervix Uteri, AJCC Version 9 - Clinical stage from 09/01/2021: FIGO Stage IB1 (cT1b1, cN0, cM0) - Signed by Heath Lark, MD on 09/01/2021 Stage prefix: Initial diagnosis    09/08/2021 Procedure   Successful placement of a right internal jugular approach power injectable Port-A-Cath. The catheter is ready for immediate use.     09/13/2021 -  Chemotherapy   Patient is on Treatment Plan : cervical Cisplatin q7d       PHYSICAL EXAMINATION: ECOG PERFORMANCE STATUS: 1 - Symptomatic but completely ambulatory  Vitals:   09/19/21 1259  BP: 125/60  Pulse: 96  Resp: 18  Temp: 98.1 F (36.7 C)  SpO2: 97%   Filed Weights   09/19/21 1259  Weight: 158 lb (71.7 kg)    GENERAL:alert, no distress and comfortable SKIN: skin color, texture, turgor are normal, no rashes or significant lesions EYES: normal, Conjunctiva are pink and non-injected, sclera clear OROPHARYNX:no exudate, no  erythema and lips, buccal mucosa, and tongue normal  NECK: supple, thyroid normal size, non-tender, without nodularity LYMPH:  no palpable lymphadenopathy in the cervical, axillary or inguinal LUNGS: clear to auscultation and percussion with normal breathing effort HEART: regular rate & rhythm and no murmurs and no lower extremity edema ABDOMEN:abdomen soft, non-tender and normal bowel sounds Musculoskeletal:no cyanosis of digits and no clubbing  NEURO: alert & oriented x 3 with fluent speech, no focal motor/sensory deficits  LABORATORY DATA:  I have reviewed the data as listed    Component Value Date/Time   NA 135 09/19/2021 1221   K 3.9 09/19/2021 1221   CL 100 09/19/2021 1221   CO2 26 09/19/2021 1221   GLUCOSE 103 (H) 09/19/2021 1221   BUN 18 09/19/2021 1221   CREATININE 0.88 09/19/2021 1221   CREATININE 0.70 11/03/2013 1005   CALCIUM 10.0 09/19/2021 1221   PROT 7.7 04/27/2021 1533   ALBUMIN 4.5 04/27/2021 1533   AST 24 04/27/2021 1533   ALT 21 04/27/2021 1533   ALKPHOS 81 04/27/2021 1533   BILITOT 0.7 04/27/2021 1533   GFRNONAA >60 09/19/2021 1221    No results found for: SPEP, UPEP  Lab Results  Component Value Date   WBC 7.1  09/19/2021   NEUTROABS 4.1 09/19/2021   HGB 14.6 09/19/2021   HCT 42.4 09/19/2021   MCV 93.0 09/19/2021   PLT 247 09/19/2021      Chemistry      Component Value Date/Time   NA 135 09/19/2021 1221   K 3.9 09/19/2021 1221   CL 100 09/19/2021 1221   CO2 26 09/19/2021 1221   BUN 18 09/19/2021 1221   CREATININE 0.88 09/19/2021 1221   CREATININE 0.70 11/03/2013 1005      Component Value Date/Time   CALCIUM 10.0 09/19/2021 1221   ALKPHOS 81 04/27/2021 1533   AST 24 04/27/2021 1533   ALT 21 04/27/2021 1533   BILITOT 0.7 04/27/2021 1533       RADIOGRAPHIC STUDIES: I have personally reviewed the radiological images as listed and agreed with the findings in the report. NM PET Image Initial (PI) Skull Base To Thigh  Result Date:  08/30/2021 CLINICAL DATA:  Initial treatment strategy for cervical carcinoma. EXAM: NUCLEAR MEDICINE PET SKULL BASE TO THIGH TECHNIQUE: 8.1 mCi F-18 FDG was injected intravenously. Full-ring PET imaging was performed from the skull base to thigh after the radiotracer. CT data was obtained and used for attenuation correction and anatomic localization. Fasting blood glucose: 95 mg/dl COMPARISON:  None. FINDINGS: Mediastinal blood-pool activity (background): SUV max = 2.5 Liver activity (reference): SUV max = N/A NECK:  No hypermetabolic lymph nodes or masses. Incidental CT findings:  None. CHEST: No hypermetabolic masses or lymphadenopathy. No suspicious pulmonary nodules seen on CT images. Incidental CT findings:  None. ABDOMEN/PELVIS: Focal intense hypermetabolism is seen in the region of the cervix, with SUV max of 9.7, consistent with known primary cervical carcinoma. No hypermetabolic lymph nodes in the pelvis or abdomen.No abnormal hypermetabolic activity within the liver, pancreas, adrenal glands, or spleen. Incidental CT findings: Nonobstructive 3 mm left renal calculus. No evidence of hydronephrosis. SKELETON: No focal hypermetabolic bone lesions to suggest skeletal metastasis. Incidental CT findings: Thoracolumbar levoscoliosis and degenerative changes. IMPRESSION: Focal hypermetabolism in the region of the cervix, consistent with known primary cervical carcinoma. No evidence of pelvic lymph node or distant metastatic disease. Electronically Signed   By: Marlaine Hind M.D.   On: 08/30/2021 16:43   IR IMAGING GUIDED PORT INSERTION  Result Date: 09/08/2021 INDICATION: Cervical cancer. In need of durable intravenous access for the initiation of chemotherapy. EXAM: IMPLANTED PORT A CATH PLACEMENT WITH ULTRASOUND AND FLUOROSCOPIC GUIDANCE COMPARISON:  PET-CT-08/30/2021 MEDICATIONS: None ANESTHESIA/SEDATION: Moderate (conscious) sedation was employed during this procedure as administered by the Interventional  Radiology RN. A total of Versed 1 mg and Fentanyl 25 mcg was administered intravenously. Moderate Sedation Time: 25 minutes. The patient's level of consciousness and vital signs were monitored continuously by radiology nursing throughout the procedure under my direct supervision. CONTRAST:  None FLUOROSCOPY TIME:  24 seconds (2 mGy) COMPLICATIONS: None immediate. PROCEDURE: The procedure, risks, benefits, and alternatives were explained to the patient. Questions regarding the procedure were encouraged and answered. The patient understands and consents to the procedure. The right neck and chest were prepped with chlorhexidine in a sterile fashion, and a sterile drape was applied covering the operative field. Maximum barrier sterile technique with sterile gowns and gloves were used for the procedure. A timeout was performed prior to the initiation of the procedure. Local anesthesia was provided with 1% lidocaine with epinephrine. After creating a small venotomy incision, a micropuncture kit was utilized to access the internal jugular vein. Real-time ultrasound guidance was utilized for vascular access including  the acquisition of a permanent ultrasound image documenting patency of the accessed vessel. The microwire was utilized to measure appropriate catheter length. A subcutaneous port pocket was then created along the upper chest wall utilizing a combination of sharp and blunt dissection. The pocket was irrigated with sterile saline. A single lumen ISP sized power injectable port was chosen for placement. The 8 Fr catheter was tunneled from the port pocket site to the venotomy incision. The port was placed in the pocket. The external catheter was trimmed to appropriate length. At the venotomy, an 8 Fr peel-away sheath was placed over a guidewire under fluoroscopic guidance. The catheter was then placed through the sheath and the sheath was removed. Final catheter positioning was confirmed and documented with a  fluoroscopic spot radiograph. The port was accessed with a Huber needle, aspirated and flushed with heparinized saline. The venotomy site was closed with an interrupted 4-0 Vicryl suture. The port pocket incision was closed with interrupted 2-0 Vicryl suture. Dermabond and Steri-strips were applied to both incisions. Dressings were applied. The patient tolerated the procedure well without immediate post procedural complication. FINDINGS: After catheter placement, the tip lies within the superior cavoatrial junction. The catheter aspirates and flushes normally and is ready for immediate use. IMPRESSION: Successful placement of a right internal jugular approach power injectable Port-A-Cath. The catheter is ready for immediate use. Electronically Signed   By: Sandi Mariscal M.D.   On: 09/08/2021 12:27

## 2021-09-20 NOTE — Patient Instructions (Signed)
[facility]  Discharge Instructions: Thank you for choosing [facility] to provide your oncology and hematology care.   If you have a lab appointment with the Cancer Center, please go directly to the Cancer Center and check in at the registration area.   Wear comfortable clothing and clothing appropriate for easy access to any Portacath or PICC line.   We strive to give you quality time with your provider. You may need to reschedule your appointment if you arrive late (15 or more minutes).  Arriving late affects you and other patients whose appointments are after yours.  Also, if you miss three or more appointments without notifying the office, you may be dismissed from the clinic at the provider's discretion.      For prescription refill requests, have your pharmacy contact our office and allow 72 hours for refills to be completed.    Today you received the following chemotherapy and/or immunotherapy agents : Cisplatin    To help prevent nausea and vomiting after your treatment, we encourage you to take your nausea medication as directed.  BELOW ARE SYMPTOMS THAT SHOULD BE REPORTED IMMEDIATELY: *FEVER GREATER THAN 100.4 F (38 C) OR HIGHER *CHILLS OR SWEATING *NAUSEA AND VOMITING THAT IS NOT CONTROLLED WITH YOUR NAUSEA MEDICATION *UNUSUAL SHORTNESS OF BREATH *UNUSUAL BRUISING OR BLEEDING *URINARY PROBLEMS (pain or burning when urinating, or frequent urination) *BOWEL PROBLEMS (unusual diarrhea, constipation, pain near the anus) TENDERNESS IN MOUTH AND THROAT WITH OR WITHOUT PRESENCE OF ULCERS (sore throat, sores in mouth, or a toothache) UNUSUAL RASH, SWELLING OR PAIN  UNUSUAL VAGINAL DISCHARGE OR ITCHING   Items with * indicate a potential emergency and should be followed up as soon as possible or go to the Emergency Department if any problems should occur.  Please show the CHEMOTHERAPY ALERT CARD or IMMUNOTHERAPY ALERT CARD at check-in to  the Emergency Department and triage nurse.  Should you have questions after your visit or need to cancel or reschedule your appointment, please contact [facility]  Dept: [phone]  and follow the prompts.  Office hours are 8:00 a.m. to 4:30 p.m. Monday - Friday. Please note that voicemails left after 4:00 p.m. may not be returned until the following business day.  We are closed weekends and major holidays. You have access to a nurse at all times for urgent questions. Please call the main number to the clinic Dept: [phone] and follow the prompts.   For any non-urgent questions, you may also contact your provider using MyChart. We now offer e-Visits for anyone 18 and older to request care online for non-urgent symptoms. For details visit [email or web address].   Also download the MyChart app! Go to the app store, search "MyChart", open the app, select [facility], and log in with your MyChart username and password.  Due to Covid, a mask is required upon entering the hospital/clinic. If you do not have a mask, one will be given to you upon arrival. For doctor visits, patients may have 1 support person aged 18 or older with them. For treatment visits, patients cannot have anyone with them due to current Covid guidelines and our immunocompromised population.   

## 2021-09-20 NOTE — Assessment & Plan Note (Signed)
Overall, she tolerated treatment well except for fatigue and mild nausea It is not clear whether her weight changes related to recent nausea We will monitor carefully We will proceed with treatment as scheduled I will see her again next week for further follow-up

## 2021-09-21 ENCOUNTER — Other Ambulatory Visit: Payer: Self-pay

## 2021-09-21 ENCOUNTER — Ambulatory Visit
Admission: RE | Admit: 2021-09-21 | Discharge: 2021-09-21 | Disposition: A | Discharge status: Home / Self Care | Payer: Medicare Other | Source: Ambulatory Visit | Attending: Radiation Oncology | Admitting: Radiation Oncology

## 2021-09-21 DIAGNOSIS — M1990 Unspecified osteoarthritis, unspecified site: Secondary | ICD-10-CM | POA: Diagnosis not present

## 2021-09-21 DIAGNOSIS — Z79899 Other long term (current) drug therapy: Secondary | ICD-10-CM | POA: Insufficient documentation

## 2021-09-21 DIAGNOSIS — F419 Anxiety disorder, unspecified: Secondary | ICD-10-CM | POA: Insufficient documentation

## 2021-09-21 DIAGNOSIS — F32A Depression, unspecified: Secondary | ICD-10-CM | POA: Insufficient documentation

## 2021-09-21 DIAGNOSIS — Z510 Encounter for antineoplastic radiation therapy: Secondary | ICD-10-CM | POA: Insufficient documentation

## 2021-09-21 DIAGNOSIS — M810 Age-related osteoporosis without current pathological fracture: Secondary | ICD-10-CM | POA: Diagnosis not present

## 2021-09-21 DIAGNOSIS — Z7982 Long term (current) use of aspirin: Secondary | ICD-10-CM | POA: Diagnosis not present

## 2021-09-21 DIAGNOSIS — Z8673 Personal history of transient ischemic attack (TIA), and cerebral infarction without residual deficits: Secondary | ICD-10-CM | POA: Diagnosis not present

## 2021-09-21 DIAGNOSIS — E785 Hyperlipidemia, unspecified: Secondary | ICD-10-CM | POA: Diagnosis not present

## 2021-09-21 DIAGNOSIS — Z5111 Encounter for antineoplastic chemotherapy: Secondary | ICD-10-CM | POA: Diagnosis not present

## 2021-09-21 DIAGNOSIS — C539 Malignant neoplasm of cervix uteri, unspecified: Secondary | ICD-10-CM | POA: Insufficient documentation

## 2021-09-21 DIAGNOSIS — K219 Gastro-esophageal reflux disease without esophagitis: Secondary | ICD-10-CM | POA: Insufficient documentation

## 2021-09-21 DIAGNOSIS — Z780 Asymptomatic menopausal state: Secondary | ICD-10-CM | POA: Insufficient documentation

## 2021-09-21 DIAGNOSIS — M419 Scoliosis, unspecified: Secondary | ICD-10-CM | POA: Insufficient documentation

## 2021-09-22 ENCOUNTER — Other Ambulatory Visit: Payer: Self-pay

## 2021-09-22 ENCOUNTER — Ambulatory Visit
Admission: RE | Admit: 2021-09-22 | Discharge: 2021-09-22 | Disposition: A | Discharge status: Home / Self Care | Payer: Medicare Other | Source: Ambulatory Visit | Attending: Radiation Oncology | Admitting: Radiation Oncology

## 2021-09-22 ENCOUNTER — Other Ambulatory Visit: Payer: Self-pay | Admitting: *Deleted

## 2021-09-22 DIAGNOSIS — Z5111 Encounter for antineoplastic chemotherapy: Secondary | ICD-10-CM | POA: Diagnosis not present

## 2021-09-22 MED ORDER — PANTOPRAZOLE SODIUM 20 MG PO TBEC: 20.0000 mg | DELAYED_RELEASE_TABLET | Freq: Every day | ORAL | 3 refills | Status: DC

## 2021-09-22 MED ORDER — ATORVASTATIN CALCIUM 40 MG PO TABS: 40.0000 mg | ORAL_TABLET | Freq: Every day | ORAL | 3 refills | Status: DC

## 2021-09-22 MED ORDER — ESCITALOPRAM OXALATE 10 MG PO TABS: 10.0000 mg | ORAL_TABLET | Freq: Every day | ORAL | 3 refills | Status: DC

## 2021-09-23 ENCOUNTER — Other Ambulatory Visit: Payer: Self-pay

## 2021-09-23 ENCOUNTER — Ambulatory Visit
Admission: RE | Admit: 2021-09-23 | Discharge: 2021-09-23 | Disposition: A | Discharge status: Home / Self Care | Payer: Medicare Other | Source: Ambulatory Visit | Attending: Radiation Oncology | Admitting: Radiation Oncology

## 2021-09-23 DIAGNOSIS — Z5111 Encounter for antineoplastic chemotherapy: Secondary | ICD-10-CM | POA: Diagnosis not present

## 2021-09-26 ENCOUNTER — Inpatient Hospital Stay: Payer: Medicare Other | Admitting: Hematology and Oncology

## 2021-09-26 ENCOUNTER — Other Ambulatory Visit: Payer: Self-pay | Admitting: Hematology and Oncology

## 2021-09-26 ENCOUNTER — Other Ambulatory Visit (HOSPITAL_COMMUNITY): Payer: Self-pay

## 2021-09-26 ENCOUNTER — Encounter: Payer: Self-pay | Admitting: Hematology and Oncology

## 2021-09-26 ENCOUNTER — Other Ambulatory Visit: Payer: Self-pay

## 2021-09-26 ENCOUNTER — Inpatient Hospital Stay: Payer: Medicare Other

## 2021-09-26 ENCOUNTER — Ambulatory Visit
Admission: RE | Admit: 2021-09-26 | Discharge: 2021-09-26 | Disposition: A | Discharge status: Home / Self Care | Payer: Medicare Other | Source: Ambulatory Visit | Attending: Radiation Oncology | Admitting: Radiation Oncology

## 2021-09-26 DIAGNOSIS — Z8673 Personal history of transient ischemic attack (TIA), and cerebral infarction without residual deficits: Secondary | ICD-10-CM | POA: Insufficient documentation

## 2021-09-26 DIAGNOSIS — Z7982 Long term (current) use of aspirin: Secondary | ICD-10-CM | POA: Insufficient documentation

## 2021-09-26 DIAGNOSIS — C539 Malignant neoplasm of cervix uteri, unspecified: Secondary | ICD-10-CM | POA: Diagnosis not present

## 2021-09-26 DIAGNOSIS — R197 Diarrhea, unspecified: Secondary | ICD-10-CM | POA: Insufficient documentation

## 2021-09-26 DIAGNOSIS — E860 Dehydration: Secondary | ICD-10-CM | POA: Insufficient documentation

## 2021-09-26 DIAGNOSIS — M810 Age-related osteoporosis without current pathological fracture: Secondary | ICD-10-CM | POA: Insufficient documentation

## 2021-09-26 DIAGNOSIS — F419 Anxiety disorder, unspecified: Secondary | ICD-10-CM | POA: Insufficient documentation

## 2021-09-26 DIAGNOSIS — M419 Scoliosis, unspecified: Secondary | ICD-10-CM | POA: Insufficient documentation

## 2021-09-26 DIAGNOSIS — F32A Depression, unspecified: Secondary | ICD-10-CM | POA: Insufficient documentation

## 2021-09-26 DIAGNOSIS — R110 Nausea: Secondary | ICD-10-CM | POA: Insufficient documentation

## 2021-09-26 DIAGNOSIS — M1990 Unspecified osteoarthritis, unspecified site: Secondary | ICD-10-CM | POA: Insufficient documentation

## 2021-09-26 DIAGNOSIS — K219 Gastro-esophageal reflux disease without esophagitis: Secondary | ICD-10-CM | POA: Insufficient documentation

## 2021-09-26 DIAGNOSIS — Z79899 Other long term (current) drug therapy: Secondary | ICD-10-CM | POA: Insufficient documentation

## 2021-09-26 DIAGNOSIS — Z5111 Encounter for antineoplastic chemotherapy: Secondary | ICD-10-CM | POA: Insufficient documentation

## 2021-09-26 DIAGNOSIS — E876 Hypokalemia: Secondary | ICD-10-CM | POA: Insufficient documentation

## 2021-09-26 DIAGNOSIS — E785 Hyperlipidemia, unspecified: Secondary | ICD-10-CM | POA: Insufficient documentation

## 2021-09-26 LAB — CBC WITH DIFFERENTIAL (CANCER CENTER ONLY)
Abs Immature Granulocytes: 0.02 10*3/uL (ref 0.00–0.07)
Basophils Absolute: 0 10*3/uL (ref 0.0–0.1)
Basophils Relative: 0 %
Eosinophils Absolute: 0.5 10*3/uL (ref 0.0–0.5)
Eosinophils Relative: 8 %
HCT: 42.1 % (ref 36.0–46.0)
Hemoglobin: 14.2 g/dL (ref 12.0–15.0)
Immature Granulocytes: 0 %
Lymphocytes Relative: 10 %
Lymphs Abs: 0.7 10*3/uL (ref 0.7–4.0)
MCH: 32.1 pg (ref 26.0–34.0)
MCHC: 33.7 g/dL (ref 30.0–36.0)
MCV: 95.2 fL (ref 80.0–100.0)
Monocytes Absolute: 0.3 10*3/uL (ref 0.1–1.0)
Monocytes Relative: 5 %
Neutro Abs: 4.8 10*3/uL (ref 1.7–7.7)
Neutrophils Relative %: 77 %
Platelet Count: 176 10*3/uL (ref 150–400)
RBC: 4.42 MIL/uL (ref 3.87–5.11)
RDW: 12.2 % (ref 11.5–15.5)
WBC Count: 6.3 10*3/uL (ref 4.0–10.5)
nRBC: 0 % (ref 0.0–0.2)

## 2021-09-26 LAB — BASIC METABOLIC PANEL - CANCER CENTER ONLY
Anion gap: 9 (ref 5–15)
BUN: 16 mg/dL (ref 8–23)
CO2: 27 mmol/L (ref 22–32)
Calcium: 9.5 mg/dL (ref 8.9–10.3)
Chloride: 100 mmol/L (ref 98–111)
Creatinine: 0.78 mg/dL (ref 0.44–1.00)
GFR, Estimated: >60 mL/min (ref >60)
Glucose, Bld: 112 mg/dL — ABNORMAL HIGH (ref 70–99)
Potassium: 3.4 mmol/L — ABNORMAL LOW (ref 3.5–5.1)
Sodium: 136 mmol/L (ref 135–145)

## 2021-09-26 LAB — MAGNESIUM: Magnesium: 1.5 mg/dL — ABNORMAL LOW (ref 1.7–2.4)

## 2021-09-26 MED ORDER — DEXAMETHASONE 4 MG PO TABS
4.0000 mg | ORAL_TABLET | Freq: Every day | ORAL | 0 refills | Status: DC
  Filled 2021-09-26: qty 30, 30d supply, fill #0

## 2021-09-26 MED FILL — Fosaprepitant Dimeglumine For IV Infusion 150 MG (Base Eq): INTRAVENOUS | Qty: 5 | Status: AC

## 2021-09-26 MED FILL — Dexamethasone Sodium Phosphate Inj 100 MG/10ML: INTRAMUSCULAR | Qty: 1 | Status: AC

## 2021-09-26 NOTE — Assessment & Plan Note (Signed)
She will take Imodium as needed

## 2021-09-26 NOTE — Assessment & Plan Note (Signed)
Overall, she tolerated treatment well, complicated by mild diarrhea and nausea She has electrolyte imbalance with hypokalemia and hypomagnesemia but they were mild We will proceed with treatment without delay and I will see her again next week for further follow-up

## 2021-09-26 NOTE — Assessment & Plan Note (Signed)
This is triggered by diarrhea I will increase the dose of IV magnesium during infusion tomorrow

## 2021-09-26 NOTE — Progress Notes (Signed)
[facility] OFFICE PROGRESS NOTE  Patient Care Team: Leamon Arnt, MD as PCP - General (Family Medicine) Key, Nelia Shi, NP as Nurse Practitioner (Gynecology) Lyndee Hensen, PT as Physical Therapist (Physical Therapy) Jacqulyn Liner, RN as Oncology Nurse Navigator (Oncology)  ASSESSMENT & PLAN:  Malignant neoplasm of cervix ([place]) Overall, she tolerated treatment well, complicated by mild diarrhea and nausea She has electrolyte imbalance with hypokalemia and hypomagnesemia but they were mild We will proceed with treatment without delay and I will see her again next week for further follow-up  Hypomagnesemia This is triggered by diarrhea I will increase the dose of IV magnesium during infusion tomorrow  Hypokalemia This is caused by recent diarrhea and hypomagnesemia Recommend potassium rich diet  Diarrhea She will take Imodium as needed  Nausea without vomiting She has nausea usually within day 3 of treatment I recommend addition of dexamethasone to take starting the day after treatment for 3 days  No orders of the defined types were placed in this encounter.   All questions were answered. The patient knows to call the clinic with any problems, questions or concerns. The total time spent in the appointment was 30 minutes encounter with patients including review of chart and various tests results, discussions about plan of care and coordination of care plan   Heath Lark, MD 09/26/2021 10:15 AM  INTERVAL HISTORY: Please see below for problem oriented charting. she returns for treatment follow-up She is seen prior to cycle 3 of cisplatin She had some nausea starting on day 3 and loose bowel movement that she took Imodium No hearing changes or peripheral neuropathy  REVIEW OF SYSTEMS:   Constitutional: Denies fevers, chills or abnormal weight loss Eyes: Denies blurriness of vision Ears, nose, mouth, throat, and face: Denies mucositis or sore  throat Respiratory: Denies cough, dyspnea or wheezes Cardiovascular: Denies palpitation, chest discomfort or lower extremity swelling Skin: Denies abnormal skin rashes Lymphatics: Denies new lymphadenopathy or easy bruising Neurological:Denies numbness, tingling or new weaknesses Behavioral/Psych: Mood is stable, no new changes  All other systems were reviewed with the patient and are negative.  I have reviewed the past medical history, past surgical history, social history and family history with the patient and they are unchanged from previous note.  ALLERGIES:  has No Known Allergies.  MEDICATIONS:  Current Outpatient Medications  Medication Sig Dispense Refill   dexamethasone (DECADRON) 4 MG tablet Take 1 tablet (4 mg total) by mouth daily. 30 tablet 0   acetaminophen (TYLENOL) 500 MG tablet Take 500 mg by mouth as needed.     alendronate (FOSAMAX) 70 MG tablet Take 1 tablet (70 mg total) by mouth once a week. Take with a full glass of water on an empty stomach and sit upright for 30 minutes. (Patient taking differently: Take 70 mg by mouth every Sunday. Take with a full glass of water on an empty stomach and sit upright for 30 minutes. Sunday's) 12 tablet 3   ALPRAZolam (XANAX) 0.5 MG tablet Take 0.5-1 tablets (0.25-0.5 mg total) by mouth daily as needed for anxiety (panic symptoms). 30 tablet 0   aspirin EC 81 MG EC tablet Take 1 tablet (81 mg total) by mouth daily. Swallow whole. 30 tablet 11   atorvastatin (LIPITOR) 40 MG tablet Take 1 tablet (40 mg total) by mouth at bedtime. 90 tablet 3   Calcium Carbonate (CALCIUM-CARB 600 PO) Take 1 tablet by mouth daily.     cholecalciferol (VITAMIN D) 1000 UNITS tablet Take  1,000 Units by mouth daily.     escitalopram (LEXAPRO) 10 MG tablet Take 1 tablet (10 mg total) by mouth daily. 90 tablet 3   lidocaine-prilocaine (EMLA) cream Apply to affected area once 30 g 3   ondansetron (ZOFRAN) 8 MG tablet Take 1 tablet by mouth every 8  hours as  needed. 30 tablet 1   pantoprazole (PROTONIX) 20 MG tablet Take 1 tablet (20 mg total) by mouth daily. 90 tablet 3   prochlorperazine (COMPAZINE) 10 MG tablet Take 1 tablet  by mouth every 6 hours as needed (Nausea or vomiting). 30 tablet 1   No current facility-administered medications for this visit.    SUMMARY OF ONCOLOGIC HISTORY: Oncology History  Malignant neoplasm of cervix ([place])  07/05/2021 Pathology Results   A. ENDOMETRIAL CURETTINGS:  Blood clots containing numerous strips of squamous epithelium with features of high-grade SIL(CIN-3/severe dysplasia).  Normal endocervical tissue or endometrial tissue are not identified.   B. CERVIX, CONE BIOPSY:  Invasive squamous cell carcinoma, focally keratinizing and moderately differentiated with circumferential involvement.  All margins including the deep margin are positive for squamous cell carcinoma.  Please see comment. P16 positive   08/16/2021 Initial Diagnosis   Malignant neoplasm of cervix ([place])   08/30/2021 PET scan   Focal hypermetabolism in the region of the cervix, consistent with known primary cervical carcinoma.   No evidence of pelvic lymph node or distant metastatic disease.     09/01/2021 Cancer Staging   Staging form: Cervix Uteri, AJCC Version 9 - Clinical stage from 09/01/2021: FIGO Stage IB1 (cT1b1, cN0, cM0) - Signed by Heath Lark, MD on 09/01/2021 Stage prefix: Initial diagnosis    09/08/2021 Procedure   Successful placement of a right internal jugular approach power injectable Port-A-Cath. The catheter is ready for immediate use.     09/13/2021 -  Chemotherapy   Patient is on Treatment Plan : cervical Cisplatin q7d       PHYSICAL EXAMINATION: ECOG PERFORMANCE STATUS: 1 - Symptomatic but completely ambulatory  Vitals:   09/26/21 0948  BP: 111/76  Pulse: 85  Resp: 18  Temp: 98.4 F (36.9 C)  SpO2: 100%   Filed Weights   09/26/21 0948  Weight: 160 lb 3.2 oz (72.7 kg)    GENERAL:alert, no  distress and comfortable NEURO: alert & oriented x 3 with fluent speech, no focal motor/sensory deficits  LABORATORY DATA:  I have reviewed the data as listed    Component Value Date/Time   NA 136 09/26/2021 0856   K 3.4 (L) 09/26/2021 0856   CL 100 09/26/2021 0856   CO2 27 09/26/2021 0856   GLUCOSE 112 (H) 09/26/2021 0856   BUN 16 09/26/2021 0856   CREATININE 0.78 09/26/2021 0856   CREATININE 0.70 11/03/2013 1005   CALCIUM 9.5 09/26/2021 0856   PROT 7.7 04/27/2021 1533   ALBUMIN 4.5 04/27/2021 1533   AST 24 04/27/2021 1533   ALT 21 04/27/2021 1533   ALKPHOS 81 04/27/2021 1533   BILITOT 0.7 04/27/2021 1533   GFRNONAA >60 09/26/2021 0856    No results found for: SPEP, UPEP  Lab Results  Component Value Date   WBC 6.3 09/26/2021   NEUTROABS 4.8 09/26/2021   HGB 14.2 09/26/2021   HCT 42.1 09/26/2021   MCV 95.2 09/26/2021   PLT 176 09/26/2021      Chemistry      Component Value Date/Time   NA 136 09/26/2021 0856   K 3.4 (L) 09/26/2021 0856   CL 100 09/26/2021  0856   CO2 27 09/26/2021 0856   BUN 16 09/26/2021 0856   CREATININE 0.78 09/26/2021 0856   CREATININE 0.70 11/03/2013 1005      Component Value Date/Time   CALCIUM 9.5 09/26/2021 0856   ALKPHOS 81 04/27/2021 1533   AST 24 04/27/2021 1533   ALT 21 04/27/2021 1533   BILITOT 0.7 04/27/2021 1533

## 2021-09-26 NOTE — Assessment & Plan Note (Signed)
She has nausea usually within day 3 of treatment I recommend addition of dexamethasone to take starting the day after treatment for 3 days

## 2021-09-26 NOTE — Assessment & Plan Note (Signed)
This is caused by recent diarrhea and hypomagnesemia Recommend potassium rich diet

## 2021-09-27 ENCOUNTER — Ambulatory Visit
Admission: RE | Admit: 2021-09-27 | Discharge: 2021-09-27 | Disposition: A | Discharge status: Home / Self Care | Payer: Medicare Other | Source: Ambulatory Visit | Attending: Radiation Oncology | Admitting: Radiation Oncology

## 2021-09-27 ENCOUNTER — Ambulatory Visit: Payer: Medicare Other

## 2021-09-27 ENCOUNTER — Inpatient Hospital Stay: Payer: Medicare Other

## 2021-09-27 VITALS — BP 107/63 | HR 90 | Temp 98.0°F | Resp 16 | Wt 161.2 lb

## 2021-09-27 DIAGNOSIS — C539 Malignant neoplasm of cervix uteri, unspecified: Secondary | ICD-10-CM

## 2021-09-27 DIAGNOSIS — Z5111 Encounter for antineoplastic chemotherapy: Secondary | ICD-10-CM | POA: Diagnosis not present

## 2021-09-27 MED ORDER — SODIUM CHLORIDE 0.9 % IV SOLN
40.0000 mg/m2 | Freq: Once | INTRAVENOUS | Status: AC
  Administered 2021-09-27: 74 mg via INTRAVENOUS
  Filled 2021-09-27: qty 74

## 2021-09-27 MED ORDER — POTASSIUM CHLORIDE IN NACL 20-0.9 MEQ/L-% IV SOLN
Freq: Once | INTRAVENOUS | Status: AC
  Filled 2021-09-27: qty 1000

## 2021-09-27 MED ORDER — SODIUM CHLORIDE 0.9 % IV SOLN
150.0000 mg | Freq: Once | INTRAVENOUS | Status: AC
  Administered 2021-09-27: 150 mg via INTRAVENOUS
  Filled 2021-09-27: qty 150

## 2021-09-27 MED ORDER — MAGNESIUM SULFATE 4 GM/100ML IV SOLN
4.0000 g | Freq: Once | INTRAVENOUS | Status: AC
  Administered 2021-09-27: 4 g via INTRAVENOUS
  Filled 2021-09-27: qty 100

## 2021-09-27 MED ORDER — SODIUM CHLORIDE 0.9% FLUSH
10.0000 mL | INTRAVENOUS | Status: DC | PRN
  Administered 2021-09-27: 10 mL

## 2021-09-27 MED ORDER — SODIUM CHLORIDE 0.9 % IV SOLN: Freq: Once | INTRAVENOUS | Status: AC

## 2021-09-27 MED ORDER — HEPARIN SOD (PORK) LOCK FLUSH 100 UNIT/ML IV SOLN
500.0000 [IU] | Freq: Once | INTRAVENOUS | Status: AC | PRN
  Administered 2021-09-27: 500 [IU]

## 2021-09-27 MED ORDER — PALONOSETRON HCL INJECTION 0.25 MG/5ML
0.2500 mg | Freq: Once | INTRAVENOUS | Status: AC
  Administered 2021-09-27: 0.25 mg via INTRAVENOUS
  Filled 2021-09-27: qty 5

## 2021-09-27 MED ORDER — SODIUM CHLORIDE 0.9 % IV SOLN
10.0000 mg | Freq: Once | INTRAVENOUS | Status: AC
  Administered 2021-09-27: 10 mg via INTRAVENOUS
  Filled 2021-09-27: qty 10

## 2021-09-27 NOTE — Patient Instructions (Addendum)
[facility]  Discharge Instructions: Thank you for choosing [facility] to provide your oncology and hematology care.   If you have a lab appointment with the [facility], please go directly to the [facility] and check in at the registration area.   Wear comfortable clothing and clothing appropriate for easy access to any Portacath or PICC line.   We strive to give you quality time with your provider. You may need to reschedule your appointment if you arrive late (15 or more minutes).  Arriving late affects you and other patients whose appointments are after yours.  Also, if you miss three or more appointments without notifying the office, you may be dismissed from the clinic at the providers discretion.      For prescription refill requests, have your pharmacy contact our office and allow 72 hours for refills to be completed.    Today you received the following chemotherapy and/or immunotherapy agent: Cisplatin   To help prevent nausea and vomiting after your treatment, we encourage you to take your nausea medication as directed.  BELOW ARE SYMPTOMS THAT SHOULD BE REPORTED IMMEDIATELY: *FEVER GREATER THAN 100.4 F (38 C) OR HIGHER *CHILLS OR SWEATING *NAUSEA AND VOMITING THAT IS NOT CONTROLLED WITH YOUR NAUSEA MEDICATION *UNUSUAL SHORTNESS OF BREATH *UNUSUAL BRUISING OR BLEEDING *URINARY PROBLEMS (pain or burning when urinating, or frequent urination) *BOWEL PROBLEMS (unusual diarrhea, constipation, pain near the anus) TENDERNESS IN MOUTH AND THROAT WITH OR WITHOUT PRESENCE OF ULCERS (sore throat, sores in mouth, or a toothache) UNUSUAL RASH, SWELLING OR PAIN  UNUSUAL VAGINAL DISCHARGE OR ITCHING   Items with * indicate a potential emergency and should be followed up as soon as possible or go to the Emergency Department if any problems should occur.  Please show the CHEMOTHERAPY ALERT CARD or IMMUNOTHERAPY ALERT CARD at check-in to the  Emergency Department and triage nurse.  Should you have questions after your visit or need to cancel or reschedule your appointment, please contact [facility]  Dept: [phone]  and follow the prompts.  Office hours are 8:00 a.m. to 4:30 p.m. Monday - Friday. Please note that voicemails left after 4:00 p.m. may not be returned until the following business day.  We are closed weekends and major holidays. You have access to a nurse at all times for urgent questions. Please call the main number to the clinic Dept: [phone] and follow the prompts.   For any non-urgent questions, you may also contact your provider using MyChart. We now offer e-Visits for anyone 32 and older to request care online for non-urgent symptoms. For details visit [email or web address].   Also download the MyChart app! Go to the app store, search "MyChart", open the app, select [facility], and log in with your MyChart username and password.  Due to Covid, a mask is required upon entering the hospital/clinic. If you do not have a mask, one will be given to you upon arrival. For doctor visits, patients may have 1 support person aged 32 or older with them. For treatment visits, patients cannot have anyone with them due to current Covid guidelines and our immunocompromised population.   Hypomagnesemia Hypomagnesemia is a condition in which the level of magnesium in the blood is too low. Magnesium is a mineral that is found in many foods. It is used in many different processes in the body. Hypomagnesemia can affect every organ in the body. In severe cases, it can cause life-threatening  problems. What are the causes? This condition may be caused by: Not getting enough magnesium in your diet or not having enough healthy foods to eat (malnutrition). Problems with magnesium absorption in the intestines. Dehydration. Excessive use of alcohol. Vomiting. Severe or long-term (chronic) diarrhea. Some  medicines, including medicines that make you urinate more often (diuretics). Certain diseases, such as kidney disease, diabetes, celiac disease, and overactive thyroid. What are the signs or symptoms? Symptoms of this condition include: Loss of appetite, nausea, and vomiting. Involuntary shaking or trembling of a body part (tremor). Muscle weakness or tingling in the arms and legs. Sudden tightening of muscles (muscle spasms). Confusion. Psychiatric issues, such as: Depression and irritability. Psychosis. A feeling of fluttering of the heart (palpitations). Seizures. These symptoms are more severe if magnesium levels drop suddenly. How is this diagnosed? This condition may be diagnosed based on: Your symptoms and medical history. A physical exam. Blood and urine tests. How is this treated? Treatment depends on the cause and the severity of the condition. It may be treated by: Taking a magnesium supplement. This can be taken in pill form. If the condition is severe, magnesium is usually given through an IV. Making changes to your diet. You may be directed to eat foods that have a lot of magnesium, such as green leafy vegetables, peas, beans, and nuts. Not drinking alcohol. If you are struggling not to drink, ask your health care provider for help. Follow these instructions at home: Eating and drinking   Make sure that your diet includes foods with magnesium. Foods that have a lot of magnesium in them include: Green leafy vegetables, such as spinach and broccoli. Beans and peas. Nuts and seeds, such as almonds and sunflower seeds. Whole grains, such as whole grain bread and fortified cereals. Drink fluids that contain salts and minerals (electrolytes), such as sports drinks, when you are active. Do not drink alcohol. General instructions Take over-the-counter and prescription medicines only as told by your health care provider. Take magnesium supplements as directed if your health  care provider tells you to take them. Have your magnesium levels monitored as told by your health care provider. Keep all follow-up visits. This is important. Contact a health care provider if: You get worse instead of better. Your symptoms return. Get help right away if: You develop severe muscle weakness. You have trouble breathing. You feel that your heart is racing. These symptoms may represent a serious problem that is an emergency. Do not wait to see if the symptoms will go away. Get medical help right away. Call your local emergency services (911 in the [place] Do not drive yourself to the hospital. Summary Hypomagnesemia is a condition in which the level of magnesium in the blood is too low. Hypomagnesemia can affect every organ in the body. Treatment may include eating more foods that contain magnesium, taking magnesium supplements, and not drinking alcohol. Have your magnesium levels monitored as told by your health care provider. This information is not intended to replace advice given to you by your health care provider. Make sure you discuss any questions you have with your health care provider. Document Revised: 01/04/2021 Document Reviewed: 01/04/2021 Elsevier Patient Education  [place].  Hypokalemia Hypokalemia means that the amount of potassium in the blood is lower than normal. Potassium is a chemical (electrolyte) that helps regulate the amount of fluid in the body. It also stimulates muscle tightening (contraction) and helps nerves work properly. Normally, most of the body's potassium is  inside cells, and only a very small amount is in the blood. Because the amount in the blood is so small, minor changes to potassium levels in the blood can be life-threatening. What are the causes? This condition may be caused by: Antibiotic medicine. Diarrhea or vomiting. Taking too much of a medicine that helps you have a bowel movement (laxative) can cause diarrhea and lead  to hypokalemia. Chronic kidney disease (CKD). Medicines that help the body get rid of excess fluid (diuretics). Eating disorders, such as bulimia. Low magnesium levels in the body. Sweating a lot. What are the signs or symptoms? Symptoms of this condition include: Weakness. Constipation. Fatigue. Muscle cramps. Mental confusion. Skipped heartbeats or irregular heartbeat (palpitations). Tingling or numbness. How is this diagnosed? This condition is diagnosed with a blood test. How is this treated? This condition may be treated by: Taking potassium supplements by mouth. Adjusting the medicines that you take. Eating more foods that contain a lot of potassium. If your potassium level is very low, you may need to get potassium through an IV and be monitored in the hospital. Follow these instructions at home:  Take over-the-counter and prescription medicines only as told by your health care provider. This includes vitamins and supplements. Eat a healthy diet. A healthy diet includes fresh fruits and vegetables, whole grains, healthy fats, and lean proteins. If instructed, eat more foods that contain a lot of potassium. This includes: Nuts, such as peanuts and pistachios. Seeds, such as sunflower seeds and pumpkin seeds. Peas, lentils, and lima beans. Whole grain and bran cereals and breads. Fresh fruits and vegetables, such as apricots, avocado, bananas, cantaloupe, kiwi, oranges, tomatoes, asparagus, and potatoes. Orange juice. Tomato juice. Red meats. Yogurt. Keep all follow-up visits as told by your health care provider. This is important. Contact a health care provider if you: Have weakness that gets worse. Feel your heart pounding or racing. Vomit. Have diarrhea. Have diabetes (diabetes mellitus) and you have trouble keeping your blood sugar (glucose) in your target range. Get help right away if you: Have chest pain. Have shortness of breath. Have vomiting or diarrhea  that lasts for more than 2 days. Faint. Summary Hypokalemia means that the amount of potassium in the blood is lower than normal. This condition is diagnosed with a blood test. Hypokalemia may be treated by taking potassium supplements, adjusting the medicines that you take, or eating more foods that are high in potassium. If your potassium level is very low, you may need to get potassium through an IV and be monitored in the hospital. This information is not intended to replace advice given to you by your health care provider. Make sure you discuss any questions you have with your health care provider. Document Revised: 03/19/2018 Document Reviewed: 03/20/2018 Elsevier Patient Education  [place].

## 2021-09-28 ENCOUNTER — Other Ambulatory Visit: Payer: Self-pay

## 2021-09-28 ENCOUNTER — Ambulatory Visit
Admission: RE | Admit: 2021-09-28 | Discharge: 2021-09-28 | Disposition: A | Discharge status: Home / Self Care | Payer: Medicare Other | Source: Ambulatory Visit | Attending: Radiation Oncology | Admitting: Radiation Oncology

## 2021-09-28 DIAGNOSIS — Z5111 Encounter for antineoplastic chemotherapy: Secondary | ICD-10-CM | POA: Diagnosis not present

## 2021-09-29 ENCOUNTER — Ambulatory Visit
Admission: RE | Admit: 2021-09-29 | Discharge: 2021-09-29 | Disposition: A | Discharge status: Home / Self Care | Payer: Medicare Other | Source: Ambulatory Visit | Attending: Radiation Oncology | Admitting: Radiation Oncology

## 2021-09-29 DIAGNOSIS — Z5111 Encounter for antineoplastic chemotherapy: Secondary | ICD-10-CM | POA: Diagnosis not present

## 2021-09-29 NOTE — Telephone Encounter (Signed)
LVM for patient

## 2021-09-30 ENCOUNTER — Other Ambulatory Visit: Payer: Self-pay

## 2021-09-30 ENCOUNTER — Ambulatory Visit
Admission: RE | Admit: 2021-09-30 | Discharge: 2021-09-30 | Disposition: A | Discharge status: Home / Self Care | Payer: Medicare Other | Source: Ambulatory Visit | Attending: Radiation Oncology | Admitting: Radiation Oncology

## 2021-09-30 DIAGNOSIS — Z5111 Encounter for antineoplastic chemotherapy: Secondary | ICD-10-CM | POA: Diagnosis not present

## 2021-10-02 ENCOUNTER — Encounter (HOSPITAL_COMMUNITY): Payer: Self-pay | Admitting: Emergency Medicine

## 2021-10-02 ENCOUNTER — Emergency Department (HOSPITAL_COMMUNITY)
Admission: EM | Admit: 2021-10-02 | Discharge: 2021-10-02 | Disposition: A | Discharge status: Home / Self Care | Payer: Medicare Other | Attending: Emergency Medicine | Admitting: Emergency Medicine

## 2021-10-02 ENCOUNTER — Other Ambulatory Visit: Payer: Self-pay

## 2021-10-02 DIAGNOSIS — E876 Hypokalemia: Secondary | ICD-10-CM | POA: Insufficient documentation

## 2021-10-02 DIAGNOSIS — R110 Nausea: Secondary | ICD-10-CM | POA: Insufficient documentation

## 2021-10-02 DIAGNOSIS — E871 Hypo-osmolality and hyponatremia: Secondary | ICD-10-CM | POA: Insufficient documentation

## 2021-10-02 DIAGNOSIS — R748 Abnormal levels of other serum enzymes: Secondary | ICD-10-CM | POA: Diagnosis not present

## 2021-10-02 DIAGNOSIS — R197 Diarrhea, unspecified: Secondary | ICD-10-CM | POA: Diagnosis present

## 2021-10-02 DIAGNOSIS — Z7982 Long term (current) use of aspirin: Secondary | ICD-10-CM | POA: Diagnosis not present

## 2021-10-02 LAB — CBC
HCT: 36.7 % (ref 36.0–46.0)
Hemoglobin: 12.8 g/dL (ref 12.0–15.0)
MCH: 33 pg (ref 26.0–34.0)
MCHC: 34.9 g/dL (ref 30.0–36.0)
MCV: 94.6 fL (ref 80.0–100.0)
Platelets: 162 10*3/uL (ref 150–400)
RBC: 3.88 MIL/uL (ref 3.87–5.11)
RDW: 12.3 % (ref 11.5–15.5)
WBC: 4.2 10*3/uL (ref 4.0–10.5)
nRBC: 0 % (ref 0.0–0.2)

## 2021-10-02 LAB — COMPREHENSIVE METABOLIC PANEL
ALT: 32 U/L (ref 0–44)
AST: 28 U/L (ref 15–41)
Albumin: 4 g/dL (ref 3.5–5.0)
Alkaline Phosphatase: 63 U/L (ref 38–126)
Anion gap: 10 (ref 5–15)
BUN: 17 mg/dL (ref 8–23)
CO2: 25 mmol/L (ref 22–32)
Calcium: 9.4 mg/dL (ref 8.9–10.3)
Chloride: 99 mmol/L (ref 98–111)
Creatinine, Ser: 0.55 mg/dL (ref 0.44–1.00)
GFR, Estimated: >60 mL/min (ref >60)
Glucose, Bld: 107 mg/dL — ABNORMAL HIGH (ref 70–99)
Potassium: 3.3 mmol/L — ABNORMAL LOW (ref 3.5–5.1)
Sodium: 134 mmol/L — ABNORMAL LOW (ref 135–145)
Total Bilirubin: 0.4 mg/dL (ref 0.3–1.2)
Total Protein: 6.7 g/dL (ref 6.5–8.1)

## 2021-10-02 LAB — URINALYSIS, ROUTINE W REFLEX MICROSCOPIC
Bilirubin Urine: NEGATIVE
Glucose, UA: NEGATIVE mg/dL
Hgb urine dipstick: NEGATIVE
Ketones, ur: NEGATIVE mg/dL
Nitrite: NEGATIVE
Protein, ur: NEGATIVE mg/dL
Specific Gravity, Urine: 1.02 (ref 1.005–1.030)
pH: 5 (ref 5.0–8.0)

## 2021-10-02 LAB — LIPASE, BLOOD: Lipase: 58 U/L — ABNORMAL HIGH (ref 11–51)

## 2021-10-02 LAB — MAGNESIUM: Magnesium: 1.6 mg/dL — ABNORMAL LOW (ref 1.7–2.4)

## 2021-10-02 MED ORDER — SODIUM CHLORIDE 0.9 % IV BOLUS
1000.0000 mL | Freq: Once | INTRAVENOUS | Status: AC
  Administered 2021-10-02: 1000 mL via INTRAVENOUS

## 2021-10-02 MED ORDER — ONDANSETRON HCL 4 MG/2ML IJ SOLN
4.0000 mg | Freq: Once | INTRAMUSCULAR | Status: AC
  Administered 2021-10-02: 4 mg via INTRAVENOUS
  Filled 2021-10-02: qty 2

## 2021-10-02 MED ORDER — MAGNESIUM OXIDE -MG SUPPLEMENT 400 (240 MG) MG PO TABS
400.0000 mg | ORAL_TABLET | Freq: Once | ORAL | Status: AC
  Administered 2021-10-02: 400 mg via ORAL
  Filled 2021-10-02: qty 1

## 2021-10-02 MED ORDER — HEPARIN SOD (PORK) LOCK FLUSH 100 UNIT/ML IV SOLN
500.0000 [IU] | Freq: Once | INTRAVENOUS | Status: AC
Start: 2021-10-02 — End: 2021-10-02
  Administered 2021-10-02: 500 [IU]
  Filled 2021-10-02: qty 5

## 2021-10-02 MED ORDER — POTASSIUM CHLORIDE CRYS ER 20 MEQ PO TBCR
20.0000 meq | EXTENDED_RELEASE_TABLET | Freq: Once | ORAL | Status: AC
  Administered 2021-10-02: 20 meq via ORAL
  Filled 2021-10-02: qty 1

## 2021-10-02 NOTE — ED Provider Triage Note (Signed)
Emergency Medicine Provider Triage Evaluation Note  Taylor Buchanan , a 66 y.o. female  was evaluated in triage.  Pt complains of diarrhea, persistent over the past 2 to 3 days.  Patient is currently under treatment for cervical cancer.  She has received chemotherapy 5 days ago and radiation therapy 2 days ago.  She had diarrhea last week after treatment but this was controlled with Imodium.  Currently frequent watery, nonbloody stool.  No abdominal pain or cramping.  No fevers.  She is concerned that she is becoming dehydrated.  Review of Systems  Positive: Diarrhea Negative: Abdominal pain  Physical Exam  BP 114/85 (BP Location: Left Arm)    Pulse 91    Temp 98.3 F (36.8 C) (Oral)    Resp 16    LMP 08/21/2002    SpO2 97%  Gen:   Awake, no distress   Resp:  Normal effort  MSK:   Moves extremities without difficulty  Other:  Abdomen soft and nontender  Medical Decision Making  Medically screening exam initiated at 4:18 PM.  Appropriate orders placed.  [name] was informed that the remainder of the evaluation will be completed by another provider, this initial triage assessment does not replace that evaluation, and the importance of remaining in the ED until their evaluation is complete.     Carlisle Cater, PA-C 10/02/21 1619

## 2021-10-02 NOTE — ED Provider Notes (Signed)
[facility] DEPT Provider Note   CSN: [id number] Arrival date & time: 10/02/21  1533     History  Chief Complaint  Patient presents with   Diarrhea    Taylor Buchanan is a 66 y.o. female with a past medical history significant for history of TIA, malignant neoplasm of cervix currently on radiation and chemotherapy who presents to the ED due to persistent diarrhea for the past 2 to 3 days.  Patient mitts to roughly 3-4 episodes of nonbloody diarrhea daily.  No associated abdominal pain.  Patient admits to intermittent nausea however, no vomiting.  Denies fever and chills. She last underwent chemotherapy 5 days ago and radiation 2 days ago. She notes she had diarrhea after her last chemotherapy; however it was controlled with Imodium. She has been taking imodium with no relief.     Home Medications Prior to Admission medications   Medication Sig Start Date End Date Taking? Authorizing Provider  acetaminophen (TYLENOL) 500 MG tablet Take 500 mg by mouth as needed.    [provider]  alendronate (FOSAMAX) 70 MG tablet Take 1 tablet (70 mg total) by mouth once a week. Take with a full glass of water on an empty stomach and sit upright for 30 minutes. Patient taking differently: Take 70 mg by mouth every Sunday. Take with a full glass of water on an empty stomach and sit upright for 30 minutes. Sunday's 05/31/21   Leamon Arnt, MD  ALPRAZolam Duanne Moron) 0.5 MG tablet Take 0.5-1 tablets (0.25-0.5 mg total) by mouth daily as needed for anxiety (panic symptoms). 08/09/21   Leamon Arnt, MD  aspirin EC 81 MG EC tablet Take 1 tablet (81 mg total) by mouth daily. Swallow whole. 02/01/21   Wouk, Ailene Rud, MD  atorvastatin (LIPITOR) 40 MG tablet Take 1 tablet (40 mg total) by mouth at bedtime. 09/22/21   Leamon Arnt, MD  Calcium Carbonate (CALCIUM-CARB 600 PO) Take 1 tablet by mouth daily.    [provider]  cholecalciferol (VITAMIN D) 1000 UNITS  tablet Take 1,000 Units by mouth daily.    [provider]  dexamethasone (DECADRON) 4 MG tablet Take 1 tablet (4 mg total) by mouth daily. 09/26/21   Heath Lark, MD  escitalopram (LEXAPRO) 10 MG tablet Take 1 tablet (10 mg total) by mouth daily. 09/22/21   Leamon Arnt, MD  lidocaine-prilocaine (EMLA) cream Apply to affected area once 09/01/21   Heath Lark, MD  ondansetron (ZOFRAN) 8 MG tablet Take 1 tablet by mouth every 8  hours as needed. 09/01/21   Heath Lark, MD  pantoprazole (PROTONIX) 20 MG tablet Take 1 tablet (20 mg total) by mouth daily. 09/22/21   Leamon Arnt, MD  prochlorperazine (COMPAZINE) 10 MG tablet Take 1 tablet  by mouth every 6 hours as needed (Nausea or vomiting). 09/01/21   Heath Lark, MD      Allergies    Patient has no known allergies.    Review of Systems   Review of Systems  Constitutional:  Negative for chills and fever.  Respiratory:  Negative for shortness of breath.   Cardiovascular:  Negative for chest pain.  Gastrointestinal:  Positive for diarrhea and nausea. Negative for abdominal pain, blood in stool and vomiting.   Physical Exam Updated Vital Signs BP 112/78    Pulse 68    Temp 98.3 F (36.8 C) (Oral)    Resp (!) 21    LMP 08/21/2002    SpO2  96%  Physical Exam Vitals and nursing note reviewed.  Constitutional:      General: She is not in acute distress.    Appearance: She is not ill-appearing.  HENT:     Head: Normocephalic.  Eyes:     Pupils: Pupils are equal, round, and reactive to light.  Cardiovascular:     Rate and Rhythm: Normal rate and regular rhythm.     Pulses: Normal pulses.     Heart sounds: Normal heart sounds. No murmur heard.   No friction rub. No gallop.  Pulmonary:     Effort: Pulmonary effort is normal.     Breath sounds: Normal breath sounds.  Abdominal:     General: Abdomen is flat. There is no distension.     Palpations: Abdomen is soft.     Tenderness: There is no abdominal tenderness. There is no  guarding or rebound.     Comments: Abdomen soft, nondistended, nontender to palpation in all quadrants without guarding or peritoneal signs. No rebound.   Musculoskeletal:        General: Normal range of motion.     Cervical back: Neck supple.  Skin:    General: Skin is warm and dry.  Neurological:     General: No focal deficit present.     Mental Status: She is alert.  Psychiatric:        Mood and Affect: Mood normal.        Behavior: Behavior normal.    ED Results / Procedures / Treatments   Labs (all labs ordered are listed, but only abnormal results are displayed) Labs Reviewed  LIPASE, BLOOD - Abnormal; Notable for the following components:      Result Value   Lipase 58 (*)    All other components within normal limits  COMPREHENSIVE METABOLIC PANEL - Abnormal; Notable for the following components:   Sodium 134 (*)    Potassium 3.3 (*)    Glucose, Bld 107 (*)    All other components within normal limits  URINALYSIS, ROUTINE W REFLEX MICROSCOPIC - Abnormal; Notable for the following components:   APPearance HAZY (*)    Leukocytes,Ua MODERATE (*)    All other components within normal limits  MAGNESIUM - Abnormal; Notable for the following components:   Magnesium 1.6 (*)    All other components within normal limits  CBC    EKG EKG Interpretation  Date/Time:  Sunday October 02 2021 18:59:19 EST Ventricular Rate:  65 PR Interval:  149 QRS Duration: 87 QT Interval:  400 QTC Calculation: 416 R Axis:   50 Text Interpretation: Sinus rhythm Confirmed by Thamas Jaegers ([id number]) on 10/02/2021 8:27:10 PM  Radiology No results found.  Procedures Procedures    Medications Ordered in ED Medications  heparin lock flush 100 unit/mL (has no administration in time range)  sodium chloride 0.9 % bolus 1,000 mL (0 mLs Intravenous Stopped 10/02/21 2002)  ondansetron (ZOFRAN) injection 4 mg (4 mg Intravenous Given 10/02/21 1956)  potassium chloride SA (KLOR-CON M) CR tablet 20 mEq  (20 mEq Oral Given 10/02/21 2155)  magnesium oxide (MAG-OX) tablet 400 mg (400 mg Oral Given 10/02/21 2155)    ED Course/ Medical Decision Making/ A&P Clinical Course as of 10/02/21 2254  Sun Oct 02, 2021  1959 Magnesium(!): 1.6 [CA]    Clinical Course User Index [CA] Suzy Bouchard, PA-C  Medical Decision Making Amount and/or Complexity of Data Reviewed External Data Reviewed: notes.    Details: previous oncology note Labs: ordered. Decision-making details documented in ED Course.  Risk OTC drugs. Prescription drug management.   66 year old female presents to the ED due to persistent diarrhea x2 to 3 days.  Patient is currently undergoing chemotherapy and radiation for cervical cancer.  Last chemotherapy was 5 days ago.  No fever or chills.  No abdominal pain.  Upon arrival, vitals all within normal limits.  Patient is afebrile, not tachycardic or hypoxic.  Patient no acute distress.  Abdomen soft, nondistended, nontender.  Low suspicion for acute abdomen.  Will obtain abdominal labs to rule out electrolyte abnormalities.  IV fluids given. Diarrhea possibly related to chemotherapy vs. Infectious etiology. Patient denies any blood.   CBC unremarkable.  No leukocytosis and normal hemoglobin.  CMP significant for mild hyponatremia at 134.  Hypokalemia 3.3.  Hypomagnesemia at 1.6.  Potassium and magnesium repleted here in the ED.  Lipase mildly elevated at 58 however, patient has no epigastric or right upper quadrant pain.  Low suspicion for acute pancreatitis.  EKG demonstrates normal sinus rhythm with no signs of acute ischemia.  9:00 PM reassessed patient at bedside who notes improvement after IVFs and zofran. Patient has an appointment with oncology tomorrow. Considered hospitalization, but given no evidence of severe dehydration/ AKI and patient able to tolerate po, feel patient is stable for discharge to follow-up with oncologist tomorrow.   Suspect  diarrhea related to possible chemotherapy side effect vs. Viral infection. Advised patient to continue taking imodium as needed. Strict ED precautions discussed with patient. Patient states understanding and agrees to plan. Patient discharged home in no acute distress and stable vitals  Discussed with Dr. Almyra Free who agrees with assessment and plan.        Final Clinical Impression(s) / ED Diagnoses Final diagnoses:  Diarrhea, unspecified type    Rx / DC Orders ED Discharge Orders     None         Kimley, Apsey 10/02/21 2254    Luna Fuse, MD 10/09/21 [id number]

## 2021-10-02 NOTE — ED Triage Notes (Signed)
Patient hx of cervical cancer last chemo 2/7. Reports diarrhea x2 days unrelieved with imodium. Reports nausea. No vomiting.

## 2021-10-02 NOTE — Discharge Instructions (Signed)
It was a pleasure taking care of you today. As discussed, your labs were all reassuring. No signs of dehydration. You potassium and magnesium were slightly low and you were given some in the ER. Follow-up with you oncologist tomorrow for further evaluation. Return to the ER for new or worsening symptoms.

## 2021-10-03 ENCOUNTER — Encounter: Payer: Self-pay | Admitting: Hematology and Oncology

## 2021-10-03 ENCOUNTER — Inpatient Hospital Stay: Payer: Medicare Other

## 2021-10-03 ENCOUNTER — Other Ambulatory Visit: Payer: Self-pay | Admitting: Radiation Oncology

## 2021-10-03 ENCOUNTER — Telehealth: Payer: Self-pay | Admitting: Hematology and Oncology

## 2021-10-03 ENCOUNTER — Inpatient Hospital Stay: Payer: Medicare Other | Admitting: Hematology and Oncology

## 2021-10-03 ENCOUNTER — Other Ambulatory Visit (HOSPITAL_COMMUNITY): Payer: Self-pay

## 2021-10-03 ENCOUNTER — Ambulatory Visit
Admission: RE | Admit: 2021-10-03 | Discharge: 2021-10-03 | Disposition: A | Discharge status: Home / Self Care | Payer: Medicare Other | Source: Ambulatory Visit | Attending: Radiation Oncology | Admitting: Radiation Oncology

## 2021-10-03 VITALS — BP 119/70 | HR 78 | Temp 98.1°F | Resp 18

## 2021-10-03 DIAGNOSIS — E876 Hypokalemia: Secondary | ICD-10-CM | POA: Diagnosis not present

## 2021-10-03 DIAGNOSIS — C539 Malignant neoplasm of cervix uteri, unspecified: Secondary | ICD-10-CM

## 2021-10-03 DIAGNOSIS — E860 Dehydration: Secondary | ICD-10-CM | POA: Insufficient documentation

## 2021-10-03 DIAGNOSIS — R197 Diarrhea, unspecified: Secondary | ICD-10-CM

## 2021-10-03 DIAGNOSIS — Z5111 Encounter for antineoplastic chemotherapy: Secondary | ICD-10-CM | POA: Diagnosis not present

## 2021-10-03 MED ORDER — HEPARIN SOD (PORK) LOCK FLUSH 100 UNIT/ML IV SOLN
500.0000 [IU] | Freq: Once | INTRAVENOUS | Status: AC | PRN
  Administered 2021-10-03: 500 [IU]

## 2021-10-03 MED ORDER — DIPHENOXYLATE-ATROPINE 2.5-0.025 MG PO TABS
1.0000 | ORAL_TABLET | Freq: Four times a day (QID) | ORAL | 0 refills | Status: DC | PRN
  Filled 2021-10-03: qty 60, 8d supply, fill #0

## 2021-10-03 MED ORDER — SODIUM CHLORIDE 0.9 % IV SOLN: Freq: Once | INTRAVENOUS | Status: AC

## 2021-10-03 MED ORDER — SODIUM CHLORIDE 0.9% FLUSH
10.0000 mL | Freq: Once | INTRAVENOUS | Status: AC | PRN
  Administered 2021-10-03: 10 mL

## 2021-10-03 MED FILL — Fosaprepitant Dimeglumine For IV Infusion 150 MG (Base Eq): INTRAVENOUS | Qty: 5 | Status: AC

## 2021-10-03 MED FILL — Dexamethasone Sodium Phosphate Inj 100 MG/10ML: INTRAMUSCULAR | Qty: 1 | Status: AC

## 2021-10-03 NOTE — Patient Instructions (Signed)

## 2021-10-03 NOTE — Assessment & Plan Note (Signed)
I recommend IV fluid support daily and she is in agreement

## 2021-10-03 NOTE — Telephone Encounter (Signed)
Called patient about coming to appointment (10/04/21) at 0830 instead of 0730 per Loren. Left voicemail. Will reach back out

## 2021-10-03 NOTE — Progress Notes (Signed)
[place] OFFICE PROGRESS NOTE  Patient Care Team: Leamon Arnt, MD as PCP - General (Family Medicine) Key, Nelia Shi, NP as Nurse Practitioner (Gynecology) Lyndee Hensen, PT as Physical Therapist (Physical Therapy) Jacqulyn Liner, RN as Oncology Nurse Navigator (Oncology)  ASSESSMENT & PLAN:  Malignant neoplasm of cervix [facility] She has severe diarrhea causing dehydration We will proceed with treatment tomorrow with dose adjustment due to recent weight loss I recommend daily IV fluid support  Diarrhea This could be due to side effects of treatment We discussed the use of Lomotil along with Imodium  Hypomagnesemia This is due to side effects of treatment I will increase the dose of IV magnesium tomorrow  Hypokalemia Due to recent diarrhea Observe closely for now  Dehydration I recommend IV fluid support daily and she is in agreement  No orders of the defined types were placed in this encounter.   All questions were answered. The patient knows to call the clinic with any problems, questions or concerns. The total time spent in the appointment was 40 minutes encounter with patients including review of chart and various tests results, discussions about plan of care and coordination of care plan   Heath Lark, MD 10/03/2021 1:16 PM  INTERVAL HISTORY: Please see below for problem oriented charting. she returns for treatment follow-up prior to her weekly cisplatin She went to the emergency department recently for evaluation due to uncontrolled diarrhea She felt better since discharge from the hospital but continues to have weakness She is taking Imodium on a regular basis No recent nausea or vomiting She have difficulties eating and have lost a lot of weight since last time I saw her  REVIEW OF SYSTEMS:   Constitutional: Denies fevers, chills  Eyes: Denies blurriness of vision Ears, nose, mouth, throat, and face: Denies mucositis or sore  throat Respiratory: Denies cough, dyspnea or wheezes Cardiovascular: Denies palpitation, chest discomfort or lower extremity swelling Skin: Denies abnormal skin rashes Lymphatics: Denies new lymphadenopathy or easy bruising Behavioral/Psych: Mood is stable, no new changes  All other systems were reviewed with the patient and are negative.  I have reviewed the past medical history, past surgical history, social history and family history with the patient and they are unchanged from previous note.  ALLERGIES:  has No Known Allergies.  MEDICATIONS:  Current Outpatient Medications  Medication Sig Dispense Refill   acetaminophen (TYLENOL) 500 MG tablet Take 500 mg by mouth as needed.     alendronate (FOSAMAX) 70 MG tablet Take 1 tablet (70 mg total) by mouth once a week. Take with a full glass of water on an empty stomach and sit upright for 30 minutes. (Patient taking differently: Take 70 mg by mouth every Sunday. Take with a full glass of water on an empty stomach and sit upright for 30 minutes. Sunday's) 12 tablet 3   ALPRAZolam (XANAX) 0.5 MG tablet Take 0.5-1 tablets (0.25-0.5 mg total) by mouth daily as needed for anxiety (panic symptoms). 30 tablet 0   aspirin EC 81 MG EC tablet Take 1 tablet (81 mg total) by mouth daily. Swallow whole. 30 tablet 11   atorvastatin (LIPITOR) 40 MG tablet Take 1 tablet (40 mg total) by mouth at bedtime. 90 tablet 3   Calcium Carbonate (CALCIUM-CARB 600 PO) Take 1 tablet by mouth daily.     cholecalciferol (VITAMIN D) 1000 UNITS tablet Take 1,000 Units by mouth daily.     dexamethasone (DECADRON) 4 MG tablet Take 1 tablet (4 mg  total) by mouth daily. 30 tablet 0   diphenoxylate-atropine (LOMOTIL) 2.5-0.025 MG tablet Take 1-2 tablets by mouth 4 (four) times daily as needed for diarrhea or loose stools. 60 tablet 0   escitalopram (LEXAPRO) 10 MG tablet Take 1 tablet (10 mg total) by mouth daily. 90 tablet 3   lidocaine-prilocaine (EMLA) cream Apply to affected  area once 30 g 3   ondansetron (ZOFRAN) 8 MG tablet Take 1 tablet by mouth every 8  hours as needed. 30 tablet 1   pantoprazole (PROTONIX) 20 MG tablet Take 1 tablet (20 mg total) by mouth daily. 90 tablet 3   prochlorperazine (COMPAZINE) 10 MG tablet Take 1 tablet  by mouth every 6 hours as needed (Nausea or vomiting). 30 tablet 1   No current facility-administered medications for this visit.    SUMMARY OF ONCOLOGIC HISTORY: Oncology History  Malignant neoplasm of cervix ([place])  07/05/2021 Pathology Results   A. ENDOMETRIAL CURETTINGS:  Blood clots containing numerous strips of squamous epithelium with features of high-grade SIL(CIN-3/severe dysplasia).  Normal endocervical tissue or endometrial tissue are not identified.   B. CERVIX, CONE BIOPSY:  Invasive squamous cell carcinoma, focally keratinizing and moderately differentiated with circumferential involvement.  All margins including the deep margin are positive for squamous cell carcinoma.  Please see comment. P16 positive   08/16/2021 Initial Diagnosis   Malignant neoplasm of cervix ([place])   08/30/2021 PET scan   Focal hypermetabolism in the region of the cervix, consistent with known primary cervical carcinoma.   No evidence of pelvic lymph node or distant metastatic disease.     09/01/2021 Cancer Staging   Staging form: Cervix Uteri, AJCC Version 9 - Clinical stage from 09/01/2021: FIGO Stage IB1 (cT1b1, cN0, cM0) - Signed by Heath Lark, MD on 09/01/2021 Stage prefix: Initial diagnosis    09/08/2021 Procedure   Successful placement of a right internal jugular approach power injectable Port-A-Cath. The catheter is ready for immediate use.     09/13/2021 -  Chemotherapy   Patient is on Treatment Plan : cervical Cisplatin q7d       PHYSICAL EXAMINATION: ECOG PERFORMANCE STATUS: 2 - Symptomatic, <50% confined to bed  Vitals:   10/03/21 1022  BP: 105/78  Pulse: 81  Resp: 18  Temp: 98.1 F (36.7 C)  SpO2: 100%    Filed Weights   10/03/21 1022  Weight: 156 lb 6.4 oz (70.9 kg)    GENERAL:alert, no distress and comfortable.  She looks weak  NEURO: alert & oriented x 3 with fluent speech, no focal motor/sensory deficits  LABORATORY DATA:  I have reviewed the data as listed    Component Value Date/Time   NA 134 (L) 10/02/2021 1556   K 3.3 (L) 10/02/2021 1556   CL 99 10/02/2021 1556   CO2 25 10/02/2021 1556   GLUCOSE 107 (H) 10/02/2021 1556   BUN 17 10/02/2021 1556   CREATININE 0.55 10/02/2021 1556   CREATININE 0.78 09/26/2021 0856   CREATININE 0.70 11/03/2013 1005   CALCIUM 9.4 10/02/2021 1556   PROT 6.7 10/02/2021 1556   ALBUMIN 4.0 10/02/2021 1556   AST 28 10/02/2021 1556   ALT 32 10/02/2021 1556   ALKPHOS 63 10/02/2021 1556   BILITOT 0.4 10/02/2021 1556   GFRNONAA >60 10/02/2021 1556   GFRNONAA >60 09/26/2021 0856    No results found for: SPEP, UPEP  Lab Results  Component Value Date   WBC 4.2 10/02/2021   NEUTROABS 4.8 09/26/2021   HGB 12.8 10/02/2021  HCT 36.7 10/02/2021   MCV 94.6 10/02/2021   PLT 162 10/02/2021      Chemistry      Component Value Date/Time   NA 134 (L) 10/02/2021 1556   K 3.3 (L) 10/02/2021 1556   CL 99 10/02/2021 1556   CO2 25 10/02/2021 1556   BUN 17 10/02/2021 1556   CREATININE 0.55 10/02/2021 1556   CREATININE 0.78 09/26/2021 0856   CREATININE 0.70 11/03/2013 1005      Component Value Date/Time   CALCIUM 9.4 10/02/2021 1556   ALKPHOS 63 10/02/2021 1556   AST 28 10/02/2021 1556   ALT 32 10/02/2021 1556   BILITOT 0.4 10/02/2021 1556

## 2021-10-03 NOTE — Assessment & Plan Note (Signed)
Due to recent diarrhea Observe closely for now

## 2021-10-03 NOTE — Assessment & Plan Note (Signed)
This could be due to side effects of treatment We discussed the use of Lomotil along with Imodium

## 2021-10-03 NOTE — Telephone Encounter (Signed)
Called to follow up with patient about changing her appointment time from 0730 to 0830 (request from charge RN). Talked with the patients husband Marlou Sa and he is aware of the changes made to the patients appointment. States he will let the patient know.

## 2021-10-03 NOTE — Assessment & Plan Note (Signed)
This is due to side effects of treatment I will increase the dose of IV magnesium tomorrow

## 2021-10-03 NOTE — Assessment & Plan Note (Signed)
She has severe diarrhea causing dehydration We will proceed with treatment tomorrow with dose adjustment due to recent weight loss I recommend daily IV fluid support

## 2021-10-04 ENCOUNTER — Inpatient Hospital Stay: Payer: Medicare Other

## 2021-10-04 ENCOUNTER — Other Ambulatory Visit: Payer: Self-pay

## 2021-10-04 ENCOUNTER — Ambulatory Visit
Admission: RE | Admit: 2021-10-04 | Discharge: 2021-10-04 | Disposition: A | Discharge status: Home / Self Care | Payer: Medicare Other | Source: Ambulatory Visit | Attending: Radiation Oncology | Admitting: Radiation Oncology

## 2021-10-04 VITALS — BP 144/71 | HR 68 | Temp 98.1°F | Resp 18 | Wt 156.8 lb

## 2021-10-04 DIAGNOSIS — Z5111 Encounter for antineoplastic chemotherapy: Secondary | ICD-10-CM | POA: Diagnosis not present

## 2021-10-04 DIAGNOSIS — C539 Malignant neoplasm of cervix uteri, unspecified: Secondary | ICD-10-CM

## 2021-10-04 MED ORDER — SODIUM CHLORIDE 0.9% FLUSH
10.0000 mL | INTRAVENOUS | Status: DC | PRN
  Administered 2021-10-04: 10 mL

## 2021-10-04 MED ORDER — SODIUM CHLORIDE 0.9 % IV SOLN
150.0000 mg | Freq: Once | INTRAVENOUS | Status: AC
  Administered 2021-10-04: 150 mg via INTRAVENOUS
  Filled 2021-10-04: qty 150

## 2021-10-04 MED ORDER — SODIUM CHLORIDE 0.9 % IV SOLN
40.0000 mg/m2 | Freq: Once | INTRAVENOUS | Status: AC
  Administered 2021-10-04: 73 mg via INTRAVENOUS
  Filled 2021-10-04: qty 73

## 2021-10-04 MED ORDER — SODIUM CHLORIDE 0.9 % IV SOLN
10.0000 mg | Freq: Once | INTRAVENOUS | Status: AC
  Administered 2021-10-04: 10 mg via INTRAVENOUS
  Filled 2021-10-04: qty 10

## 2021-10-04 MED ORDER — MAGNESIUM SULFATE 2 GM/50ML IV SOLN
2.0000 g | Freq: Once | INTRAVENOUS | Status: AC
  Administered 2021-10-04: 2 g via INTRAVENOUS
  Filled 2021-10-04: qty 50

## 2021-10-04 MED ORDER — SODIUM CHLORIDE 0.9 % IV SOLN: Freq: Once | INTRAVENOUS | Status: AC

## 2021-10-04 MED ORDER — PALONOSETRON HCL INJECTION 0.25 MG/5ML
0.2500 mg | Freq: Once | INTRAVENOUS | Status: AC
  Administered 2021-10-04: 0.25 mg via INTRAVENOUS
  Filled 2021-10-04: qty 5

## 2021-10-04 MED ORDER — POTASSIUM CHLORIDE IN NACL 20-0.9 MEQ/L-% IV SOLN
Freq: Once | INTRAVENOUS | Status: AC
  Filled 2021-10-04: qty 1000

## 2021-10-04 MED ORDER — HEPARIN SOD (PORK) LOCK FLUSH 100 UNIT/ML IV SOLN
500.0000 [IU] | Freq: Once | INTRAVENOUS | Status: AC | PRN
  Administered 2021-10-04: 500 [IU]

## 2021-10-04 NOTE — Progress Notes (Signed)
Confirmed dose of Cisplatin w/ Dr. Alvy Bimler.  It is recalculated for her wt loss.  Kennith Center, Pharm.D., CPP 10/04/2021@9 :13 AM

## 2021-10-04 NOTE — Patient Instructions (Signed)
[facility] ONCOLOGY  Discharge Instructions: Thank you for choosing [facility] to provide your oncology and hematology care.   If you have a lab appointment with the [facility], please go directly to the [facility] and check in at the registration area.   Wear comfortable clothing and clothing appropriate for easy access to any Portacath or PICC line.   We strive to give you quality time with your provider. You may need to reschedule your appointment if you arrive late (15 or more minutes).  Arriving late affects you and other patients whose appointments are after yours.  Also, if you miss three or more appointments without notifying the office, you may be dismissed from the clinic at the providers discretion.      For prescription refill requests, have your pharmacy contact our office and allow 72 hours for refills to be completed.    Today you received the following chemotherapy and/or immunotherapy agent: Cisplatin   To help prevent nausea and vomiting after your treatment, we encourage you to take your nausea medication as directed.  BELOW ARE SYMPTOMS THAT SHOULD BE REPORTED IMMEDIATELY: *FEVER GREATER THAN 100.4 F (38 C) OR HIGHER *CHILLS OR SWEATING *NAUSEA AND VOMITING THAT IS NOT CONTROLLED WITH YOUR NAUSEA MEDICATION *UNUSUAL SHORTNESS OF BREATH *UNUSUAL BRUISING OR BLEEDING *URINARY PROBLEMS (pain or burning when urinating, or frequent urination) *BOWEL PROBLEMS (unusual diarrhea, constipation, pain near the anus) TENDERNESS IN MOUTH AND THROAT WITH OR WITHOUT PRESENCE OF ULCERS (sore throat, sores in mouth, or a toothache) UNUSUAL RASH, SWELLING OR PAIN  UNUSUAL VAGINAL DISCHARGE OR ITCHING   Items with * indicate a potential emergency and should be followed up as soon as possible or go to the Emergency Department if any problems should occur.  Please show the CHEMOTHERAPY ALERT CARD or IMMUNOTHERAPY ALERT CARD at check-in to the  Emergency Department and triage nurse.  Should you have questions after your visit or need to cancel or reschedule your appointment, please contact [facility]  Dept: [phone]  and follow the prompts.  Office hours are 8:00 a.m. to 4:30 p.m. Monday - Friday. Please note that voicemails left after 4:00 p.m. may not be returned until the following business day.  We are closed weekends and major holidays. You have access to a nurse at all times for urgent questions. Please call the main number to the clinic Dept: [phone] and follow the prompts.   For any non-urgent questions, you may also contact your provider using MyChart. We now offer e-Visits for anyone 89 and older to request care online for non-urgent symptoms. For details visit [email or web address].   Also download the MyChart app! Go to the app store, search "MyChart", open the app, select [facility], and log in with your MyChart username and password.  Due to Covid, a mask is required upon entering the hospital/clinic. If you do not have a mask, one will be given to you upon arrival. For doctor visits, patients may have 1 support person aged 26 or older with them. For treatment visits, patients cannot have anyone with them due to current Covid guidelines and our immunocompromised population.   Hypomagnesemia Hypomagnesemia is a condition in which the level of magnesium in the blood is too low. Magnesium is a mineral that is found in many foods. It is used in many different processes in the body. Hypomagnesemia can affect every organ in the body. In severe cases, it can cause life-threatening  problems. What are the causes? This condition may be caused by: Not getting enough magnesium in your diet or not having enough healthy foods to eat (malnutrition). Problems with magnesium absorption in the intestines. Dehydration. Excessive use of alcohol. Vomiting. Severe or long-term (chronic) diarrhea. Some  medicines, including medicines that make you urinate more often (diuretics). Certain diseases, such as kidney disease, diabetes, celiac disease, and overactive thyroid. What are the signs or symptoms? Symptoms of this condition include: Loss of appetite, nausea, and vomiting. Involuntary shaking or trembling of a body part (tremor). Muscle weakness or tingling in the arms and legs. Sudden tightening of muscles (muscle spasms). Confusion. Psychiatric issues, such as: Depression and irritability. Psychosis. A feeling of fluttering of the heart (palpitations). Seizures. These symptoms are more severe if magnesium levels drop suddenly. How is this diagnosed? This condition may be diagnosed based on: Your symptoms and medical history. A physical exam. Blood and urine tests. How is this treated? Treatment depends on the cause and the severity of the condition. It may be treated by: Taking a magnesium supplement. This can be taken in pill form. If the condition is severe, magnesium is usually given through an IV. Making changes to your diet. You may be directed to eat foods that have a lot of magnesium, such as green leafy vegetables, peas, beans, and nuts. Not drinking alcohol. If you are struggling not to drink, ask your health care provider for help. Follow these instructions at home: Eating and drinking   Make sure that your diet includes foods with magnesium. Foods that have a lot of magnesium in them include: Green leafy vegetables, such as spinach and broccoli. Beans and peas. Nuts and seeds, such as almonds and sunflower seeds. Whole grains, such as whole grain bread and fortified cereals. Drink fluids that contain salts and minerals (electrolytes), such as sports drinks, when you are active. Do not drink alcohol. General instructions Take over-the-counter and prescription medicines only as told by your health care provider. Take magnesium supplements as directed if your health  care provider tells you to take them. Have your magnesium levels monitored as told by your health care provider. Keep all follow-up visits. This is important. Contact a health care provider if: You get worse instead of better. Your symptoms return. Get help right away if: You develop severe muscle weakness. You have trouble breathing. You feel that your heart is racing. These symptoms may represent a serious problem that is an emergency. Do not wait to see if the symptoms will go away. Get medical help right away. Call your local emergency services (911 in the [place] Do not drive yourself to the hospital. Summary Hypomagnesemia is a condition in which the level of magnesium in the blood is too low. Hypomagnesemia can affect every organ in the body. Treatment may include eating more foods that contain magnesium, taking magnesium supplements, and not drinking alcohol. Have your magnesium levels monitored as told by your health care provider. This information is not intended to replace advice given to you by your health care provider. Make sure you discuss any questions you have with your health care provider. Document Revised: 01/04/2021 Document Reviewed: 01/04/2021 Elsevier Patient Education  [place].  Hypokalemia Hypokalemia means that the amount of potassium in the blood is lower than normal. Potassium is a chemical (electrolyte) that helps regulate the amount of fluid in the body. It also stimulates muscle tightening (contraction) and helps nerves work properly. Normally, most of the body's potassium is  inside cells, and only a very small amount is in the blood. Because the amount in the blood is so small, minor changes to potassium levels in the blood can be life-threatening. What are the causes? This condition may be caused by: Antibiotic medicine. Diarrhea or vomiting. Taking too much of a medicine that helps you have a bowel movement (laxative) can cause diarrhea and lead  to hypokalemia. Chronic kidney disease (CKD). Medicines that help the body get rid of excess fluid (diuretics). Eating disorders, such as bulimia. Low magnesium levels in the body. Sweating a lot. What are the signs or symptoms? Symptoms of this condition include: Weakness. Constipation. Fatigue. Muscle cramps. Mental confusion. Skipped heartbeats or irregular heartbeat (palpitations). Tingling or numbness. How is this diagnosed? This condition is diagnosed with a blood test. How is this treated? This condition may be treated by: Taking potassium supplements by mouth. Adjusting the medicines that you take. Eating more foods that contain a lot of potassium. If your potassium level is very low, you may need to get potassium through an IV and be monitored in the hospital. Follow these instructions at home:  Take over-the-counter and prescription medicines only as told by your health care provider. This includes vitamins and supplements. Eat a healthy diet. A healthy diet includes fresh fruits and vegetables, whole grains, healthy fats, and lean proteins. If instructed, eat more foods that contain a lot of potassium. This includes: Nuts, such as peanuts and pistachios. Seeds, such as sunflower seeds and pumpkin seeds. Peas, lentils, and lima beans. Whole grain and bran cereals and breads. Fresh fruits and vegetables, such as apricots, avocado, bananas, cantaloupe, kiwi, oranges, tomatoes, asparagus, and potatoes. Orange juice. Tomato juice. Red meats. Yogurt. Keep all follow-up visits as told by your health care provider. This is important. Contact a health care provider if you: Have weakness that gets worse. Feel your heart pounding or racing. Vomit. Have diarrhea. Have diabetes (diabetes mellitus) and you have trouble keeping your blood sugar (glucose) in your target range. Get help right away if you: Have chest pain. Have shortness of breath. Have vomiting or diarrhea  that lasts for more than 2 days. Faint. Summary Hypokalemia means that the amount of potassium in the blood is lower than normal. This condition is diagnosed with a blood test. Hypokalemia may be treated by taking potassium supplements, adjusting the medicines that you take, or eating more foods that are high in potassium. If your potassium level is very low, you may need to get potassium through an IV and be monitored in the hospital. This information is not intended to replace advice given to you by your health care provider. Make sure you discuss any questions you have with your health care provider. Document Revised: 03/19/2018 Document Reviewed: 03/20/2018 Elsevier Patient Education  [place].

## 2021-10-05 ENCOUNTER — Telehealth: Payer: Self-pay | Admitting: Hematology and Oncology

## 2021-10-05 ENCOUNTER — Inpatient Hospital Stay: Payer: Medicare Other

## 2021-10-05 ENCOUNTER — Ambulatory Visit
Admission: RE | Admit: 2021-10-05 | Discharge: 2021-10-05 | Disposition: A | Discharge status: Home / Self Care | Payer: Medicare Other | Source: Ambulatory Visit | Attending: Radiation Oncology | Admitting: Radiation Oncology

## 2021-10-05 VITALS — BP 111/76 | HR 64 | Resp 16

## 2021-10-05 DIAGNOSIS — Z5111 Encounter for antineoplastic chemotherapy: Secondary | ICD-10-CM | POA: Diagnosis not present

## 2021-10-05 DIAGNOSIS — C539 Malignant neoplasm of cervix uteri, unspecified: Secondary | ICD-10-CM

## 2021-10-05 MED ORDER — SODIUM CHLORIDE 0.9 % IV SOLN: Freq: Once | INTRAVENOUS | Status: AC

## 2021-10-05 MED ORDER — SODIUM CHLORIDE 0.9% FLUSH
10.0000 mL | Freq: Once | INTRAVENOUS | Status: AC | PRN
  Administered 2021-10-05: 10 mL

## 2021-10-05 MED ORDER — HEPARIN SOD (PORK) LOCK FLUSH 100 UNIT/ML IV SOLN
500.0000 [IU] | Freq: Once | INTRAVENOUS | Status: AC | PRN
  Administered 2021-10-05: 500 [IU]

## 2021-10-05 NOTE — Patient Instructions (Signed)

## 2021-10-05 NOTE — Telephone Encounter (Signed)
I went to the infusion room to check on her She is feeling better Diarrhea is subsiding and she is eating better She will continue IV fluids as scheduled

## 2021-10-06 ENCOUNTER — Inpatient Hospital Stay: Payer: Medicare Other

## 2021-10-06 ENCOUNTER — Other Ambulatory Visit: Payer: Self-pay

## 2021-10-06 ENCOUNTER — Ambulatory Visit
Admission: RE | Admit: 2021-10-06 | Discharge: 2021-10-06 | Disposition: A | Discharge status: Home / Self Care | Payer: Medicare Other | Source: Ambulatory Visit | Attending: Radiation Oncology | Admitting: Radiation Oncology

## 2021-10-06 VITALS — BP 115/71 | HR 66 | Temp 98.3°F | Resp 18

## 2021-10-06 DIAGNOSIS — C539 Malignant neoplasm of cervix uteri, unspecified: Secondary | ICD-10-CM

## 2021-10-06 DIAGNOSIS — Z5111 Encounter for antineoplastic chemotherapy: Secondary | ICD-10-CM | POA: Diagnosis not present

## 2021-10-06 MED ORDER — SODIUM CHLORIDE 0.9% FLUSH
10.0000 mL | Freq: Once | INTRAVENOUS | Status: AC | PRN
  Administered 2021-10-06: 10 mL

## 2021-10-06 MED ORDER — HEPARIN SOD (PORK) LOCK FLUSH 100 UNIT/ML IV SOLN
500.0000 [IU] | Freq: Once | INTRAVENOUS | Status: AC | PRN
  Administered 2021-10-06: 500 [IU]

## 2021-10-06 MED ORDER — SODIUM CHLORIDE 0.9 % IV SOLN: Freq: Once | INTRAVENOUS | Status: AC

## 2021-10-06 NOTE — Patient Instructions (Signed)

## 2021-10-07 ENCOUNTER — Inpatient Hospital Stay: Payer: Medicare Other

## 2021-10-07 ENCOUNTER — Ambulatory Visit
Admission: RE | Admit: 2021-10-07 | Discharge: 2021-10-07 | Disposition: A | Discharge status: Home / Self Care | Payer: Medicare Other | Source: Ambulatory Visit | Attending: Radiation Oncology | Admitting: Radiation Oncology

## 2021-10-07 DIAGNOSIS — Z5111 Encounter for antineoplastic chemotherapy: Secondary | ICD-10-CM | POA: Diagnosis not present

## 2021-10-07 DIAGNOSIS — C539 Malignant neoplasm of cervix uteri, unspecified: Secondary | ICD-10-CM

## 2021-10-07 MED ORDER — HEPARIN SOD (PORK) LOCK FLUSH 100 UNIT/ML IV SOLN
500.0000 [IU] | Freq: Once | INTRAVENOUS | Status: AC
  Administered 2021-10-07: 500 [IU] via INTRAVENOUS

## 2021-10-07 MED ORDER — SODIUM CHLORIDE 0.9% FLUSH
10.0000 mL | Freq: Once | INTRAVENOUS | Status: AC
  Administered 2021-10-07: 10 mL via INTRAVENOUS

## 2021-10-07 MED ORDER — SODIUM CHLORIDE 0.9 % IV SOLN: Freq: Once | INTRAVENOUS | Status: AC

## 2021-10-07 NOTE — Patient Instructions (Signed)

## 2021-10-08 ENCOUNTER — Inpatient Hospital Stay: Payer: Medicare Other

## 2021-10-08 ENCOUNTER — Other Ambulatory Visit: Payer: Self-pay

## 2021-10-08 VITALS — BP 102/75 | HR 65 | Temp 99.3°F | Resp 16

## 2021-10-08 DIAGNOSIS — C539 Malignant neoplasm of cervix uteri, unspecified: Secondary | ICD-10-CM

## 2021-10-08 DIAGNOSIS — Z5111 Encounter for antineoplastic chemotherapy: Secondary | ICD-10-CM | POA: Diagnosis not present

## 2021-10-08 MED ORDER — SODIUM CHLORIDE 0.9 % IV SOLN: Freq: Once | INTRAVENOUS | Status: AC

## 2021-10-08 MED ORDER — HEPARIN SOD (PORK) LOCK FLUSH 100 UNIT/ML IV SOLN
500.0000 [IU] | Freq: Once | INTRAVENOUS | Status: AC
  Administered 2021-10-08: 500 [IU]

## 2021-10-08 MED ORDER — SODIUM CHLORIDE 0.9% FLUSH
10.0000 mL | Freq: Once | INTRAVENOUS | Status: AC
  Administered 2021-10-08: 10 mL

## 2021-10-08 NOTE — Patient Instructions (Signed)

## 2021-10-10 ENCOUNTER — Ambulatory Visit
Admission: RE | Admit: 2021-10-10 | Discharge: 2021-10-10 | Disposition: A | Discharge status: Home / Self Care | Payer: Medicare Other | Source: Ambulatory Visit | Attending: Radiation Oncology | Admitting: Radiation Oncology

## 2021-10-10 ENCOUNTER — Inpatient Hospital Stay: Payer: Medicare Other | Admitting: Hematology and Oncology

## 2021-10-10 ENCOUNTER — Encounter: Payer: Self-pay | Admitting: Hematology and Oncology

## 2021-10-10 ENCOUNTER — Inpatient Hospital Stay: Payer: Medicare Other

## 2021-10-10 ENCOUNTER — Other Ambulatory Visit: Payer: Self-pay

## 2021-10-10 DIAGNOSIS — E876 Hypokalemia: Secondary | ICD-10-CM | POA: Diagnosis not present

## 2021-10-10 DIAGNOSIS — C539 Malignant neoplasm of cervix uteri, unspecified: Secondary | ICD-10-CM

## 2021-10-10 DIAGNOSIS — D701 Agranulocytosis secondary to cancer chemotherapy: Secondary | ICD-10-CM | POA: Insufficient documentation

## 2021-10-10 DIAGNOSIS — R197 Diarrhea, unspecified: Secondary | ICD-10-CM | POA: Diagnosis not present

## 2021-10-10 DIAGNOSIS — Z5111 Encounter for antineoplastic chemotherapy: Secondary | ICD-10-CM | POA: Diagnosis not present

## 2021-10-10 DIAGNOSIS — T451X5A Adverse effect of antineoplastic and immunosuppressive drugs, initial encounter: Secondary | ICD-10-CM

## 2021-10-10 HISTORY — DX: Adverse effect of antineoplastic and immunosuppressive drugs, initial encounter: D70.1

## 2021-10-10 LAB — CBC WITH DIFFERENTIAL (CANCER CENTER ONLY)
Abs Immature Granulocytes: 0 10*3/uL (ref 0.00–0.07)
Basophils Absolute: 0 10*3/uL (ref 0.0–0.1)
Basophils Relative: 0 %
Eosinophils Absolute: 0 10*3/uL (ref 0.0–0.5)
Eosinophils Relative: 2 %
HCT: 35 % — ABNORMAL LOW (ref 36.0–46.0)
Hemoglobin: 12 g/dL (ref 12.0–15.0)
Immature Granulocytes: 0 %
Lymphocytes Relative: 13 %
Lymphs Abs: 0.3 10*3/uL — ABNORMAL LOW (ref 0.7–4.0)
MCH: 32 pg (ref 26.0–34.0)
MCHC: 34.3 g/dL (ref 30.0–36.0)
MCV: 93.3 fL (ref 80.0–100.0)
Monocytes Absolute: 0.2 10*3/uL (ref 0.1–1.0)
Monocytes Relative: 9 %
Neutro Abs: 1.9 10*3/uL (ref 1.7–7.7)
Neutrophils Relative %: 76 %
Platelet Count: 161 10*3/uL (ref 150–400)
RBC: 3.75 MIL/uL — ABNORMAL LOW (ref 3.87–5.11)
RDW: 12.4 % (ref 11.5–15.5)
WBC Count: 2.5 10*3/uL — ABNORMAL LOW (ref 4.0–10.5)
nRBC: 0 % (ref 0.0–0.2)

## 2021-10-10 LAB — BASIC METABOLIC PANEL - CANCER CENTER ONLY
Anion gap: 8 (ref 5–15)
BUN: 14 mg/dL (ref 8–23)
CO2: 29 mmol/L (ref 22–32)
Calcium: 9.2 mg/dL (ref 8.9–10.3)
Chloride: 99 mmol/L (ref 98–111)
Creatinine: 0.81 mg/dL (ref 0.44–1.00)
GFR, Estimated: >60 mL/min (ref >60)
Glucose, Bld: 109 mg/dL — ABNORMAL HIGH (ref 70–99)
Potassium: 3.3 mmol/L — ABNORMAL LOW (ref 3.5–5.1)
Sodium: 136 mmol/L (ref 135–145)

## 2021-10-10 LAB — MAGNESIUM: Magnesium: 1.2 mg/dL — ABNORMAL LOW (ref 1.7–2.4)

## 2021-10-10 MED FILL — Fosaprepitant Dimeglumine For IV Infusion 150 MG (Base Eq): INTRAVENOUS | Qty: 5 | Status: AC

## 2021-10-10 MED FILL — Dexamethasone Sodium Phosphate Inj 100 MG/10ML: INTRAMUSCULAR | Qty: 1 | Status: AC

## 2021-10-10 NOTE — Assessment & Plan Note (Signed)
We discussed potassium rich diet

## 2021-10-10 NOTE — Assessment & Plan Note (Signed)
She is getting more pancytopenic She continues to have loose stool Her blood pressure is borderline low and she has worsening hypomagnesemia and hypokalemia We will proceed with treatment tomorrow as scheduled I will cancel her next week's appointment I will see her again next week for further follow-up She will also come back for additional IV fluid support for the rest of the week

## 2021-10-10 NOTE — Progress Notes (Signed)
[facility] OFF[facility] PROGRESS NOTE  Patient Care Team: Leamon Arnt, MD as PCP - General (Family Medicine) Key, Nelia Shi, NP as Nurse Practitioner (Gynecology) Lyndee Hensen, PT as Physical Therapist (Physical Therapy) Jacqulyn Liner, RN as Oncology Nurse Navigator (Oncology)  ASSESSMENT & PLAN:  Malignant neoplasm of cervix [facility]) She is getting more pancytopenic She continues to have loose stool Her blood pressure is borderline low and she has worsening hypomagnesemia and hypokalemia We will proceed with treatment tomorrow as scheduled I will cancel her next week's appointment I will see her again next week for further follow-up She will also come back for additional IV fluid support for the rest of the week  Hypomagnesemia I will increase premeds IV magnesium tomorrow  Hypokalemia We discussed potassium rich diet  Diarrhea We discussed the use of Lomotil and Imodium She will get additional IV fluid support  Leukopenia due to antineoplastic chemotherapy [facility]) This is likely due to recent treatment. The patient denies recent history of fevers, cough, chills, diarrhea or dysuria. She is asymptomatic from the leukopenia. I will observe for now.  I will continue the chemotherapy at current dose without dosage adjustment.   No orders of the defined types were placed in this encounter.   All questions were answered. The patient knows to call the clinic with any problems, questions or concerns. The total time spent in the appointment was 30 minutes encounter with patients including review of chart and various tests results, discussions about plan of care and coordination of care plan   Heath Lark, MD 10/10/2021 11:08 AM  INTERVAL HISTORY: Please see below for problem oriented charting. she returns for treatment follow-up She felt better with IV fluid support No nausea vomiting She is regulating her antidiarrhea medicine She had 2 bowel movement yesterday No  recent fever or chills No peripheral neuropathy  REVIEW OF SYSTEMS:   Constitutional: Denies fevers, chills or abnormal weight loss Eyes: Denies blurriness of vision Ears, nose, mouth, throat, and face: Denies mucositis or sore throat Respiratory: Denies cough, dyspnea or wheezes Cardiovascular: Denies palpitation, chest discomfort or lower extremity swelling Skin: Denies abnormal skin rashes Lymphatics: Denies new lymphadenopathy or easy bruising Neurological:Denies numbness, tingling or new weaknesses Behavioral/Psych: Mood is stable, no new changes  All other systems were reviewed with the patient and are negative.  I have reviewed the past medical history, past surgical history, social history and family history with the patient and they are unchanged from previous note.  ALLERGIES:  has No Known Allergies.  MEDICATIONS:  Current Outpatient Medications  Medication Sig Dispense Refill   acetaminophen (TYLENOL) 500 MG tablet Take 500 mg by mouth as needed.     alendronate (FOSAMAX) 70 MG tablet Take 1 tablet (70 mg total) by mouth once a week. Take with a full glass of water on an empty stomach and sit upright for 30 minutes. (Patient taking differently: Take 70 mg by mouth every Sunday. Take with a full glass of water on an empty stomach and sit upright for 30 minutes. Sunday's) 12 tablet 3   ALPRAZolam (XANAX) 0.5 MG tablet Take 0.5-1 tablets (0.25-0.5 mg total) by mouth daily as needed for anxiety (panic symptoms). 30 tablet 0   aspirin EC 81 MG EC tablet Take 1 tablet (81 mg total) by mouth daily. Swallow whole. 30 tablet 11   atorvastatin (LIPITOR) 40 MG tablet Take 1 tablet (40 mg total) by mouth at bedtime. 90 tablet 3   Calcium Carbonate (CALCIUM-CARB  600 PO) Take 1 tablet by mouth daily.     cholecalciferol (VITAMIN D) 1000 UNITS tablet Take 1,000 Units by mouth daily.     dexamethasone (DECADRON) 4 MG tablet Take 1 tablet (4 mg total) by mouth daily. 30 tablet 0    diphenoxylate-atropine (LOMOTIL) 2.5-0.025 MG tablet Take 1-2 tablets by mouth 4 (four) times daily as needed for diarrhea or loose stools. 60 tablet 0   escitalopram (LEXAPRO) 10 MG tablet Take 1 tablet (10 mg total) by mouth daily. 90 tablet 3   lidocaine-prilocaine (EMLA) cream Apply to affected area once 30 g 3   ondansetron (ZOFRAN) 8 MG tablet Take 1 tablet by mouth every 8  hours as needed. 30 tablet 1   pantoprazole (PROTONIX) 20 MG tablet Take 1 tablet (20 mg total) by mouth daily. 90 tablet 3   prochlorperazine (COMPAZINE) 10 MG tablet Take 1 tablet  by mouth every 6 hours as needed (Nausea or vomiting). 30 tablet 1   No current facility-administered medications for this visit.    SUMMARY OF ONCOLOGIC HISTORY: Oncology History  Malignant neoplasm of cervix ([place])  07/05/2021 Pathology Results   A. ENDOMETRIAL CURETTINGS:  Blood clots containing numerous strips of squamous epithelium with features of high-grade SIL(CIN-3/severe dysplasia).  Normal endocervical tissue or endometrial tissue are not identified.   B. CERVIX, CONE BIOPSY:  Invasive squamous cell carcinoma, focally keratinizing and moderately differentiated with circumferential involvement.  All margins including the deep margin are positive for squamous cell carcinoma.  Please see comment. P16 positive   08/16/2021 Initial Diagnosis   Malignant neoplasm of cervix ([place])   08/30/2021 PET scan   Focal hypermetabolism in the region of the cervix, consistent with known primary cervical carcinoma.   No evidence of pelvic lymph node or distant metastatic disease.     09/01/2021 Cancer Staging   Staging form: Cervix Uteri, AJCC Version 9 - Clinical stage from 09/01/2021: FIGO Stage IB1 (cT1b1, cN0, cM0) - Signed by Heath Lark, MD on 09/01/2021 Stage prefix: Initial diagnosis    09/08/2021 Procedure   Successful placement of a right internal jugular approach power injectable Port-A-Cath. The catheter is ready for  immediate use.     09/13/2021 -  Chemotherapy   Patient is on Treatment Plan : cervical Cisplatin q7d       PHYSICAL EXAMINATION: ECOG PERFORMANCE STATUS: 1 - Symptomatic but completely ambulatory  Vitals:   10/10/21 0946  BP: (!) 101/52  Pulse: 80  Resp: 18  Temp: 99.1 F (37.3 C)  SpO2: 100%   Filed Weights   10/10/21 0946  Weight: 155 lb 6.4 oz (70.5 kg)    GENERAL:alert, no distress and comfortable NEURO: alert & oriented x 3 with fluent speech, no focal motor/sensory deficits  LABORATORY DATA:  I have reviewed the data as listed    Component Value Date/Time   NA 136 10/10/2021 0850   K 3.3 (L) 10/10/2021 0850   CL 99 10/10/2021 0850   CO2 29 10/10/2021 0850   GLUCOSE 109 (H) 10/10/2021 0850   BUN 14 10/10/2021 0850   CREATININE 0.81 10/10/2021 0850   CREATININE 0.70 11/03/2013 1005   CALCIUM 9.2 10/10/2021 0850   PROT 6.7 10/02/2021 1556   ALBUMIN 4.0 10/02/2021 1556   AST 28 10/02/2021 1556   ALT 32 10/02/2021 1556   ALKPHOS 63 10/02/2021 1556   BILITOT 0.4 10/02/2021 1556   GFRNONAA >60 10/10/2021 0850    No results found for: SPEP, UPEP  Lab Results  Component Value Date   WBC 2.5 (L) 10/10/2021   NEUTROABS 1.9 10/10/2021   HGB 12.0 10/10/2021   HCT 35.0 (L) 10/10/2021   MCV 93.3 10/10/2021   PLT 161 10/10/2021      Chemistry      Component Value Date/Time   NA 136 10/10/2021 0850   K 3.3 (L) 10/10/2021 0850   CL 99 10/10/2021 0850   CO2 29 10/10/2021 0850   BUN 14 10/10/2021 0850   CREATININE 0.81 10/10/2021 0850   CREATININE 0.70 11/03/2013 1005      Component Value Date/Time   CALCIUM 9.2 10/10/2021 0850   ALKPHOS 63 10/02/2021 1556   AST 28 10/02/2021 1556   ALT 32 10/02/2021 1556   BILITOT 0.4 10/02/2021 1556

## 2021-10-10 NOTE — Assessment & Plan Note (Signed)
I will increase premeds IV magnesium tomorrow

## 2021-10-10 NOTE — Assessment & Plan Note (Signed)
We discussed the use of Lomotil and Imodium She will get additional IV fluid support

## 2021-10-10 NOTE — Assessment & Plan Note (Signed)
This is likely due to recent treatment. The patient denies recent history of fevers, cough, chills, diarrhea or dysuria. She is asymptomatic from the leukopenia. I will observe for now.  I will continue the chemotherapy at current dose without dosage adjustment. 

## 2021-10-11 ENCOUNTER — Inpatient Hospital Stay: Payer: Medicare Other | Admitting: Nutrition

## 2021-10-11 ENCOUNTER — Inpatient Hospital Stay: Payer: Medicare Other

## 2021-10-11 ENCOUNTER — Ambulatory Visit
Admission: RE | Admit: 2021-10-11 | Discharge: 2021-10-11 | Disposition: A | Discharge status: Home / Self Care | Payer: Medicare Other | Source: Ambulatory Visit | Attending: Radiation Oncology | Admitting: Radiation Oncology

## 2021-10-11 VITALS — BP 111/73 | HR 84 | Temp 98.6°F | Resp 18

## 2021-10-11 DIAGNOSIS — Z5111 Encounter for antineoplastic chemotherapy: Secondary | ICD-10-CM | POA: Diagnosis not present

## 2021-10-11 DIAGNOSIS — C539 Malignant neoplasm of cervix uteri, unspecified: Secondary | ICD-10-CM

## 2021-10-11 MED ORDER — SODIUM CHLORIDE 0.9 % IV SOLN
10.0000 mg | Freq: Once | INTRAVENOUS | Status: AC
  Administered 2021-10-11: 10 mg via INTRAVENOUS
  Filled 2021-10-11: qty 10

## 2021-10-11 MED ORDER — SODIUM CHLORIDE 0.9% FLUSH
10.0000 mL | INTRAVENOUS | Status: DC | PRN
  Administered 2021-10-11: 10 mL

## 2021-10-11 MED ORDER — SODIUM CHLORIDE 0.9 % IV SOLN
40.0000 mg/m2 | Freq: Once | INTRAVENOUS | Status: AC
  Administered 2021-10-11: 73 mg via INTRAVENOUS
  Filled 2021-10-11: qty 73

## 2021-10-11 MED ORDER — PALONOSETRON HCL INJECTION 0.25 MG/5ML
0.2500 mg | Freq: Once | INTRAVENOUS | Status: AC
  Administered 2021-10-11: 0.25 mg via INTRAVENOUS
  Filled 2021-10-11: qty 5

## 2021-10-11 MED ORDER — MAGNESIUM SULFATE 4 GM/100ML IV SOLN
4.0000 g | Freq: Once | INTRAVENOUS | Status: AC
  Administered 2021-10-11: 4 g via INTRAVENOUS
  Filled 2021-10-11: qty 100

## 2021-10-11 MED ORDER — SODIUM CHLORIDE 0.9 % IV SOLN: Freq: Once | INTRAVENOUS | Status: AC

## 2021-10-11 MED ORDER — POTASSIUM CHLORIDE IN NACL 20-0.9 MEQ/L-% IV SOLN
Freq: Once | INTRAVENOUS | Status: AC
  Filled 2021-10-11: qty 1000

## 2021-10-11 MED ORDER — HEPARIN SOD (PORK) LOCK FLUSH 100 UNIT/ML IV SOLN
500.0000 [IU] | Freq: Once | INTRAVENOUS | Status: AC | PRN
  Administered 2021-10-11: 500 [IU]

## 2021-10-11 MED ORDER — SODIUM CHLORIDE 0.9 % IV SOLN
150.0000 mg | Freq: Once | INTRAVENOUS | Status: AC
  Administered 2021-10-11: 150 mg via INTRAVENOUS
  Filled 2021-10-11: qty 150

## 2021-10-11 NOTE — Patient Instructions (Signed)
[facility]  Discharge Instructions: Thank you for choosing [facility] to provide your oncology and hematology care.   If you have a lab appointment with the [facility], please go directly to the [facility] and check in at the registration area.   Wear comfortable clothing and clothing appropriate for easy access to any Portacath or PICC line.   We strive to give you quality time with your provider. You may need to reschedule your appointment if you arrive late (15 or more minutes).  Arriving late affects you and other patients whose appointments are after yours.  Also, if you miss three or more appointments without notifying the office, you may be dismissed from the clinic at the providers discretion.      For prescription refill requests, have your pharmacy contact our office and allow 72 hours for refills to be completed.    Today you received the following chemotherapy and/or immunotherapy agent: Cisplatin   To help prevent nausea and vomiting after your treatment, we encourage you to take your nausea medication as directed.  BELOW ARE SYMPTOMS THAT SHOULD BE REPORTED IMMEDIATELY: *FEVER GREATER THAN 100.4 F (38 C) OR HIGHER *CHILLS OR SWEATING *NAUSEA AND VOMITING THAT IS NOT CONTROLLED WITH YOUR NAUSEA MEDICATION *UNUSUAL SHORTNESS OF BREATH *UNUSUAL BRUISING OR BLEEDING *URINARY PROBLEMS (pain or burning when urinating, or frequent urination) *BOWEL PROBLEMS (unusual diarrhea, constipation, pain near the anus) TENDERNESS IN MOUTH AND THROAT WITH OR WITHOUT PRESENCE OF ULCERS (sore throat, sores in mouth, or a toothache) UNUSUAL RASH, SWELLING OR PAIN  UNUSUAL VAGINAL DISCHARGE OR ITCHING   Items with * indicate a potential emergency and should be followed up as soon as possible or go to the Emergency Department if any problems should occur.  Please show the CHEMOTHERAPY ALERT CARD or IMMUNOTHERAPY ALERT CARD at check-in to the  Emergency Department and triage nurse.  Should you have questions after your visit or need to cancel or reschedule your appointment, please contact [facility]  Dept: [phone]  and follow the prompts.  Office hours are 8:00 a.m. to 4:30 p.m. Monday - Friday. Please note that voicemails left after 4:00 p.m. may not be returned until the following business day.  We are closed weekends and major holidays. You have access to a nurse at all times for urgent questions. Please call the main number to the clinic Dept: [phone] and follow the prompts.   For any non-urgent questions, you may also contact your provider using MyChart. We now offer e-Visits for anyone 78 and older to request care online for non-urgent symptoms. For details visit [email or web address].   Also download the MyChart app! Go to the app store, search "MyChart", open the app, select [facility], and log in with your MyChart username and password.  Due to Covid, a mask is required upon entering the hospital/clinic. If you do not have a mask, one will be given to you upon arrival. For doctor visits, patients may have 1 support person aged 45 or older with them. For treatment visits, patients cannot have anyone with them due to current Covid guidelines and our immunocompromised population.   Hypomagnesemia Hypomagnesemia is a condition in which the level of magnesium in the blood is too low. Magnesium is a mineral that is found in many foods. It is used in many different processes in the body. Hypomagnesemia can affect every organ in the body. In severe cases, it can cause life-threatening  problems. What are the causes? This condition may be caused by: Not getting enough magnesium in your diet or not having enough healthy foods to eat (malnutrition). Problems with magnesium absorption in the intestines. Dehydration. Excessive use of alcohol. Vomiting. Severe or long-term (chronic) diarrhea. Some  medicines, including medicines that make you urinate more often (diuretics). Certain diseases, such as kidney disease, diabetes, celiac disease, and overactive thyroid. What are the signs or symptoms? Symptoms of this condition include: Loss of appetite, nausea, and vomiting. Involuntary shaking or trembling of a body part (tremor). Muscle weakness or tingling in the arms and legs. Sudden tightening of muscles (muscle spasms). Confusion. Psychiatric issues, such as: Depression and irritability. Psychosis. A feeling of fluttering of the heart (palpitations). Seizures. These symptoms are more severe if magnesium levels drop suddenly. How is this diagnosed? This condition may be diagnosed based on: Your symptoms and medical history. A physical exam. Blood and urine tests. How is this treated? Treatment depends on the cause and the severity of the condition. It may be treated by: Taking a magnesium supplement. This can be taken in pill form. If the condition is severe, magnesium is usually given through an IV. Making changes to your diet. You may be directed to eat foods that have a lot of magnesium, such as green leafy vegetables, peas, beans, and nuts. Not drinking alcohol. If you are struggling not to drink, ask your health care provider for help. Follow these instructions at home: Eating and drinking   Make sure that your diet includes foods with magnesium. Foods that have a lot of magnesium in them include: Green leafy vegetables, such as spinach and broccoli. Beans and peas. Nuts and seeds, such as almonds and sunflower seeds. Whole grains, such as whole grain bread and fortified cereals. Drink fluids that contain salts and minerals (electrolytes), such as sports drinks, when you are active. Do not drink alcohol. General instructions Take over-the-counter and prescription medicines only as told by your health care provider. Take magnesium supplements as directed if your health  care provider tells you to take them. Have your magnesium levels monitored as told by your health care provider. Keep all follow-up visits. This is important. Contact a health care provider if: You get worse instead of better. Your symptoms return. Get help right away if: You develop severe muscle weakness. You have trouble breathing. You feel that your heart is racing. These symptoms may represent a serious problem that is an emergency. Do not wait to see if the symptoms will go away. Get medical help right away. Call your local emergency services (911 in the [place] Do not drive yourself to the hospital. Summary Hypomagnesemia is a condition in which the level of magnesium in the blood is too low. Hypomagnesemia can affect every organ in the body. Treatment may include eating more foods that contain magnesium, taking magnesium supplements, and not drinking alcohol. Have your magnesium levels monitored as told by your health care provider. This information is not intended to replace advice given to you by your health care provider. Make sure you discuss any questions you have with your health care provider. Document Revised: 01/04/2021 Document Reviewed: 01/04/2021 Elsevier Patient Education  [place].  Hypokalemia Hypokalemia means that the amount of potassium in the blood is lower than normal. Potassium is a chemical (electrolyte) that helps regulate the amount of fluid in the body. It also stimulates muscle tightening (contraction) and helps nerves work properly. Normally, most of the body's potassium is  inside cells, and only a very small amount is in the blood. Because the amount in the blood is so small, minor changes to potassium levels in the blood can be life-threatening. What are the causes? This condition may be caused by: Antibiotic medicine. Diarrhea or vomiting. Taking too much of a medicine that helps you have a bowel movement (laxative) can cause diarrhea and lead  to hypokalemia. Chronic kidney disease (CKD). Medicines that help the body get rid of excess fluid (diuretics). Eating disorders, such as bulimia. Low magnesium levels in the body. Sweating a lot. What are the signs or symptoms? Symptoms of this condition include: Weakness. Constipation. Fatigue. Muscle cramps. Mental confusion. Skipped heartbeats or irregular heartbeat (palpitations). Tingling or numbness. How is this diagnosed? This condition is diagnosed with a blood test. How is this treated? This condition may be treated by: Taking potassium supplements by mouth. Adjusting the medicines that you take. Eating more foods that contain a lot of potassium. If your potassium level is very low, you may need to get potassium through an IV and be monitored in the hospital. Follow these instructions at home:  Take over-the-counter and prescription medicines only as told by your health care provider. This includes vitamins and supplements. Eat a healthy diet. A healthy diet includes fresh fruits and vegetables, whole grains, healthy fats, and lean proteins. If instructed, eat more foods that contain a lot of potassium. This includes: Nuts, such as peanuts and pistachios. Seeds, such as sunflower seeds and pumpkin seeds. Peas, lentils, and lima beans. Whole grain and bran cereals and breads. Fresh fruits and vegetables, such as apricots, avocado, bananas, cantaloupe, kiwi, oranges, tomatoes, asparagus, and potatoes. Orange juice. Tomato juice. Red meats. Yogurt. Keep all follow-up visits as told by your health care provider. This is important. Contact a health care provider if you: Have weakness that gets worse. Feel your heart pounding or racing. Vomit. Have diarrhea. Have diabetes (diabetes mellitus) and you have trouble keeping your blood sugar (glucose) in your target range. Get help right away if you: Have chest pain. Have shortness of breath. Have vomiting or diarrhea  that lasts for more than 2 days. Faint. Summary Hypokalemia means that the amount of potassium in the blood is lower than normal. This condition is diagnosed with a blood test. Hypokalemia may be treated by taking potassium supplements, adjusting the medicines that you take, or eating more foods that are high in potassium. If your potassium level is very low, you may need to get potassium through an IV and be monitored in the hospital. This information is not intended to replace advice given to you by your health care provider. Make sure you discuss any questions you have with your health care provider. Document Revised: 03/19/2018 Document Reviewed: 03/20/2018 Elsevier Patient Education  [place].

## 2021-10-11 NOTE — Progress Notes (Signed)
66 year old female diagnosed with cervical cancer.  Patient is followed by Dr. Alvy Bimler and Dr. Sondra Come.  She is receiving cisplatin and radiation therapy.  Today is her last chemotherapy.  Past medical history includes depression, GAD, GERD, TIA, hyperlipidemia, osteoporosis and scoliosis.  Medications include Fosamax, Xanax, Lipitor, calcium, vitamin D, Decadron, Lomotil, Zofran, Protonix, and Compazine  Labs include potassium 3.3, glucose 109, magnesium 1.2.  Height: 5 feet 6 inches. Weight: 155.4 pounds February 20. Patient weighed 161.25 pounds February 7. BMI: 24.71.   ECOG: 1.  Patient is receiving IV magnesium and has been encouraged to follow a potassium rich diet.  She takes Lomotil and Imodium for diarrhea.  She has had nausea but no vomiting.  Antiemetics are effective. Endorses about a 10 pound weight loss.  Nutrition diagnosis: Unintended weight loss related to cervical cancer and associated treatments as evidenced by 10 pound weight loss from usual body weight per patient.  Intervention: Educated patient on strategies for eating with nausea. Encouraged low fiber diet during episodes of diarrhea. Reviewed low lactose diet and encouraged lactose free products. Reviewed high potassium foods. Provided nutrition facts sheets.  Encourage smaller more frequent meals and snacks utilizing high-calorie, high-protein foods. Questions were answered.  Teach back method used.  Contact information provided.  Monitoring, evaluation, goals: Patient will tolerate adequate calories and protein to minimize weight loss.  Next visit: Patient will contact RD with questions or concerns.  **Disclaimer: This note was dictated with voice recognition software. Similar sounding words can inadvertently be transcribed and this note may contain transcription errors which may not have been corrected upon publication of note.**

## 2021-10-12 ENCOUNTER — Other Ambulatory Visit: Payer: Self-pay | Admitting: Radiology

## 2021-10-12 ENCOUNTER — Inpatient Hospital Stay: Payer: Medicare Other

## 2021-10-12 ENCOUNTER — Other Ambulatory Visit: Payer: Self-pay

## 2021-10-12 ENCOUNTER — Ambulatory Visit
Admission: RE | Admit: 2021-10-12 | Discharge: 2021-10-12 | Disposition: A | Discharge status: Home / Self Care | Payer: Medicare Other | Source: Ambulatory Visit | Attending: Radiation Oncology | Admitting: Radiation Oncology

## 2021-10-12 ENCOUNTER — Telehealth: Payer: Self-pay | Admitting: Hematology and Oncology

## 2021-10-12 VITALS — BP 118/66 | HR 62 | Temp 97.6°F | Resp 18

## 2021-10-12 DIAGNOSIS — Z5111 Encounter for antineoplastic chemotherapy: Secondary | ICD-10-CM | POA: Diagnosis not present

## 2021-10-12 DIAGNOSIS — C539 Malignant neoplasm of cervix uteri, unspecified: Secondary | ICD-10-CM

## 2021-10-12 MED ORDER — SODIUM CHLORIDE 0.9 % IV SOLN: Freq: Once | INTRAVENOUS | Status: AC

## 2021-10-12 MED ORDER — HEPARIN SOD (PORK) LOCK FLUSH 100 UNIT/ML IV SOLN
500.0000 [IU] | Freq: Once | INTRAVENOUS | Status: AC | PRN
  Administered 2021-10-12: 500 [IU]

## 2021-10-12 MED ORDER — SODIUM CHLORIDE 0.9% FLUSH
10.0000 mL | Freq: Once | INTRAVENOUS | Status: AC | PRN
  Administered 2021-10-12: 10 mL

## 2021-10-12 NOTE — Patient Instructions (Signed)

## 2021-10-12 NOTE — Telephone Encounter (Signed)
Scheduled appointment per 2/20 los. Called patient to let her know that we were able to get her added for IVF this Saturday 2/25 at 0800. Left message.

## 2021-10-13 ENCOUNTER — Ambulatory Visit
Admission: RE | Admit: 2021-10-13 | Discharge: 2021-10-13 | Disposition: A | Discharge status: Home / Self Care | Payer: Medicare Other | Source: Ambulatory Visit | Attending: Radiation Oncology | Admitting: Radiation Oncology

## 2021-10-13 ENCOUNTER — Other Ambulatory Visit: Payer: Self-pay

## 2021-10-13 ENCOUNTER — Inpatient Hospital Stay: Payer: Medicare Other

## 2021-10-13 VITALS — BP 104/52 | HR 70 | Temp 98.2°F | Resp 18

## 2021-10-13 DIAGNOSIS — Z5111 Encounter for antineoplastic chemotherapy: Secondary | ICD-10-CM | POA: Diagnosis not present

## 2021-10-13 DIAGNOSIS — C539 Malignant neoplasm of cervix uteri, unspecified: Secondary | ICD-10-CM

## 2021-10-13 MED ORDER — SODIUM CHLORIDE 0.9% FLUSH
10.0000 mL | Freq: Once | INTRAVENOUS | Status: AC | PRN
  Administered 2021-10-13: 10 mL

## 2021-10-13 MED ORDER — HEPARIN SOD (PORK) LOCK FLUSH 100 UNIT/ML IV SOLN
250.0000 [IU] | Freq: Once | INTRAVENOUS | Status: AC | PRN
  Administered 2021-10-13: 500 [IU]

## 2021-10-13 MED ORDER — SODIUM CHLORIDE 0.9 % IV SOLN: Freq: Once | INTRAVENOUS | Status: AC

## 2021-10-13 NOTE — Patient Instructions (Signed)

## 2021-10-14 ENCOUNTER — Ambulatory Visit
Admission: RE | Admit: 2021-10-14 | Discharge: 2021-10-14 | Disposition: A | Discharge status: Home / Self Care | Payer: Medicare Other | Source: Ambulatory Visit | Attending: Radiation Oncology | Admitting: Radiation Oncology

## 2021-10-14 ENCOUNTER — Inpatient Hospital Stay: Payer: Medicare Other

## 2021-10-14 VITALS — BP 110/66 | HR 70 | Temp 98.6°F | Resp 18

## 2021-10-14 DIAGNOSIS — C539 Malignant neoplasm of cervix uteri, unspecified: Secondary | ICD-10-CM

## 2021-10-14 DIAGNOSIS — Z5111 Encounter for antineoplastic chemotherapy: Secondary | ICD-10-CM | POA: Diagnosis not present

## 2021-10-14 MED ORDER — SODIUM CHLORIDE 0.9 % IV SOLN: Freq: Once | INTRAVENOUS | Status: AC

## 2021-10-14 MED ORDER — SODIUM CHLORIDE 0.9% FLUSH
10.0000 mL | Freq: Once | INTRAVENOUS | Status: AC
  Administered 2021-10-14: 10 mL

## 2021-10-14 MED ORDER — HEPARIN SOD (PORK) LOCK FLUSH 100 UNIT/ML IV SOLN
500.0000 [IU] | Freq: Once | INTRAVENOUS | Status: AC
  Administered 2021-10-14: 500 [IU]

## 2021-10-14 NOTE — Patient Instructions (Signed)

## 2021-10-15 ENCOUNTER — Other Ambulatory Visit: Payer: Self-pay

## 2021-10-15 ENCOUNTER — Inpatient Hospital Stay: Payer: Medicare Other

## 2021-10-15 VITALS — BP 98/76 | HR 76 | Temp 97.8°F

## 2021-10-15 DIAGNOSIS — Z5111 Encounter for antineoplastic chemotherapy: Secondary | ICD-10-CM | POA: Diagnosis not present

## 2021-10-15 DIAGNOSIS — C539 Malignant neoplasm of cervix uteri, unspecified: Secondary | ICD-10-CM

## 2021-10-15 MED ORDER — SODIUM CHLORIDE 0.9 % IV SOLN: Freq: Once | INTRAVENOUS | Status: AC

## 2021-10-15 MED ORDER — HEPARIN SOD (PORK) LOCK FLUSH 100 UNIT/ML IV SOLN
500.0000 [IU] | Freq: Once | INTRAVENOUS | Status: AC | PRN
  Administered 2021-10-15: 500 [IU]

## 2021-10-15 MED ORDER — SODIUM CHLORIDE 0.9% FLUSH
10.0000 mL | Freq: Once | INTRAVENOUS | Status: AC | PRN
  Administered 2021-10-15: 10 mL

## 2021-10-15 NOTE — Patient Instructions (Signed)

## 2021-10-16 ENCOUNTER — Encounter: Payer: Self-pay | Admitting: Gynecologic Oncology

## 2021-10-17 ENCOUNTER — Inpatient Hospital Stay: Payer: Medicare Other | Admitting: Hematology and Oncology

## 2021-10-17 ENCOUNTER — Inpatient Hospital Stay: Payer: Medicare Other

## 2021-10-17 ENCOUNTER — Other Ambulatory Visit: Payer: Self-pay

## 2021-10-17 ENCOUNTER — Telehealth: Payer: Self-pay

## 2021-10-17 DIAGNOSIS — T451X5A Adverse effect of antineoplastic and immunosuppressive drugs, initial encounter: Secondary | ICD-10-CM

## 2021-10-17 DIAGNOSIS — D701 Agranulocytosis secondary to cancer chemotherapy: Secondary | ICD-10-CM

## 2021-10-17 DIAGNOSIS — C539 Malignant neoplasm of cervix uteri, unspecified: Secondary | ICD-10-CM

## 2021-10-17 DIAGNOSIS — R197 Diarrhea, unspecified: Secondary | ICD-10-CM

## 2021-10-17 DIAGNOSIS — Z5111 Encounter for antineoplastic chemotherapy: Secondary | ICD-10-CM | POA: Diagnosis not present

## 2021-10-17 LAB — CBC WITH DIFFERENTIAL (CANCER CENTER ONLY)
Abs Immature Granulocytes: 0 10*3/uL (ref 0.00–0.07)
Basophils Absolute: 0 10*3/uL (ref 0.0–0.1)
Basophils Relative: 0 %
Eosinophils Absolute: 0 10*3/uL (ref 0.0–0.5)
Eosinophils Relative: 0 %
HCT: 31.4 % — ABNORMAL LOW (ref 36.0–46.0)
Hemoglobin: 11.2 g/dL — ABNORMAL LOW (ref 12.0–15.0)
Immature Granulocytes: 0 %
Lymphocytes Relative: 25 %
Lymphs Abs: 0.6 10*3/uL — ABNORMAL LOW (ref 0.7–4.0)
MCH: 33 pg (ref 26.0–34.0)
MCHC: 35.7 g/dL (ref 30.0–36.0)
MCV: 92.6 fL (ref 80.0–100.0)
Monocytes Absolute: 0.2 10*3/uL (ref 0.1–1.0)
Monocytes Relative: 8 %
Neutro Abs: 1.5 10*3/uL — ABNORMAL LOW (ref 1.7–7.7)
Neutrophils Relative %: 67 %
Platelet Count: 109 10*3/uL — ABNORMAL LOW (ref 150–400)
RBC: 3.39 MIL/uL — ABNORMAL LOW (ref 3.87–5.11)
RDW: 12.2 % (ref 11.5–15.5)
WBC Count: 2.3 10*3/uL — ABNORMAL LOW (ref 4.0–10.5)
nRBC: 0 % (ref 0.0–0.2)

## 2021-10-17 LAB — BASIC METABOLIC PANEL - CANCER CENTER ONLY
Anion gap: 9 (ref 5–15)
BUN: 17 mg/dL (ref 8–23)
CO2: 27 mmol/L (ref 22–32)
Calcium: 9.4 mg/dL (ref 8.9–10.3)
Chloride: 100 mmol/L (ref 98–111)
Creatinine: 0.85 mg/dL (ref 0.44–1.00)
GFR, Estimated: >60 mL/min (ref >60)
Glucose, Bld: 113 mg/dL — ABNORMAL HIGH (ref 70–99)
Potassium: 3.1 mmol/L — ABNORMAL LOW (ref 3.5–5.1)
Sodium: 136 mmol/L (ref 135–145)

## 2021-10-17 LAB — MAGNESIUM: Magnesium: 1.2 mg/dL — ABNORMAL LOW (ref 1.7–2.4)

## 2021-10-17 NOTE — Telephone Encounter (Signed)
-----   Message from Heath Lark, MD sent at 10/17/2021 10:33 AM EST ----- Pls call her, mag and K are still low but not too bad No need oral replacement

## 2021-10-17 NOTE — Telephone Encounter (Signed)
Called and given below message. She verbalized understanding. 

## 2021-10-18 ENCOUNTER — Encounter: Payer: Self-pay | Admitting: Hematology and Oncology

## 2021-10-18 ENCOUNTER — Other Ambulatory Visit: Payer: Self-pay

## 2021-10-18 ENCOUNTER — Inpatient Hospital Stay: Payer: Medicare Other

## 2021-10-18 ENCOUNTER — Encounter (HOSPITAL_BASED_OUTPATIENT_CLINIC_OR_DEPARTMENT_OTHER): Payer: Self-pay | Admitting: Radiation Oncology

## 2021-10-18 NOTE — Progress Notes (Signed)
[facility] PROGRESS NOTE  Patient Care Team: Leamon Arnt, MD as PCP - General (Family Medicine) Key, Nelia Shi, NP as Nurse Practitioner (Gynecology) Lyndee Hensen, PT as Physical Therapist (Physical Therapy) Jacqulyn Liner, RN as Oncology Nurse Navigator (Oncology)  ASSESSMENT & PLAN:  Malignant neoplasm of cervix [facility]) She is still recovering from side effects of treatment She have lost some weight but overall, she felt slightly better since last time I saw her We will continue supportive care She does not think she needs further IV fluid support I plan to see her again next month for further follow-up  Hypomagnesemia This is due to recent treatment She will continue Imodium to control her diarrhea  Diarrhea This is better controlled with Imodium and Lomotil  Leukopenia due to antineoplastic chemotherapy [facility]) She has mild persistent pancytopenia due to recent treatment but not symptomatic Observe only  No orders of the defined types were placed in this encounter.   All questions were answered. The patient knows to call the clinic with any problems, questions or concerns. The total time spent in the appointment was 25 minutes encounter with patients including review of chart and various tests results, discussions about plan of care and coordination of care plan   Heath Lark, MD 10/18/2021 9:19 AM  INTERVAL HISTORY: Please see below for problem oriented charting. she returns for treatment follow-up  She had mild diarrhea but well controlled with Imodium and Lomotil Her energy level is fair She is attempting to eat No recent fever or chills  REVIEW OF SYSTEMS:   Constitutional: Denies fevers, chills or abnormal weight loss Eyes: Denies blurriness of vision Ears, nose, mouth, throat, and face: Denies mucositis or sore throat Respiratory: Denies cough, dyspnea or wheezes Skin: Denies abnormal skin rashes Lymphatics: Denies new lymphadenopathy  or easy bruising Neurological:Denies numbness, tingling or new weaknesses Behavioral/Psych: Mood is stable, no new changes  All other systems were reviewed with the patient and are negative.  I have reviewed the past medical history, past surgical history, social history and family history with the patient and they are unchanged from previous note.  ALLERGIES:  has No Known Allergies.  MEDICATIONS:  Current Outpatient Medications  Medication Sig Dispense Refill   acetaminophen (TYLENOL) 500 MG tablet Take 500 mg by mouth as needed.     alendronate (FOSAMAX) 70 MG tablet Take 1 tablet (70 mg total) by mouth once a week. Take with a full glass of water on an empty stomach and sit upright for 30 minutes. (Patient taking differently: Take 70 mg by mouth every Sunday. Take with a full glass of water on an empty stomach and sit upright for 30 minutes. Sunday's) 12 tablet 3   ALPRAZolam (XANAX) 0.5 MG tablet Take 0.5-1 tablets (0.25-0.5 mg total) by mouth daily as needed for anxiety (panic symptoms). 30 tablet 0   aspirin EC 81 MG EC tablet Take 1 tablet (81 mg total) by mouth daily. Swallow whole. 30 tablet 11   atorvastatin (LIPITOR) 40 MG tablet Take 1 tablet (40 mg total) by mouth at bedtime. 90 tablet 3   Calcium Carbonate (CALCIUM-CARB 600 PO) Take 1 tablet by mouth daily.     cholecalciferol (VITAMIN D) 1000 UNITS tablet Take 1,000 Units by mouth daily.     dexamethasone (DECADRON) 4 MG tablet Take 1 tablet (4 mg total) by mouth daily. 30 tablet 0   diphenoxylate-atropine (LOMOTIL) 2.5-0.025 MG tablet Take 1-2 tablets by mouth 4 (four) times daily  as needed for diarrhea or loose stools. 60 tablet 0   escitalopram (LEXAPRO) 10 MG tablet Take 1 tablet (10 mg total) by mouth daily. 90 tablet 3   lidocaine-prilocaine (EMLA) cream Apply to affected area once 30 g 3   ondansetron (ZOFRAN) 8 MG tablet Take 1 tablet by mouth every 8  hours as needed. 30 tablet 1   pantoprazole (PROTONIX) 20 MG  tablet Take 1 tablet (20 mg total) by mouth daily. 90 tablet 3   prochlorperazine (COMPAZINE) 10 MG tablet Take 1 tablet  by mouth every 6 hours as needed (Nausea or vomiting). 30 tablet 1   No current facility-administered medications for this visit.    SUMMARY OF ONCOLOGIC HISTORY: Oncology History  Malignant neoplasm of cervix ([place])  07/05/2021 Pathology Results   A. ENDOMETRIAL CURETTINGS:  Blood clots containing numerous strips of squamous epithelium with features of high-grade SIL(CIN-3/severe dysplasia).  Normal endocervical tissue or endometrial tissue are not identified.   B. CERVIX, CONE BIOPSY:  Invasive squamous cell carcinoma, focally keratinizing and moderately differentiated with circumferential involvement.  All margins including the deep margin are positive for squamous cell carcinoma.  Please see comment. P16 positive   08/16/2021 Initial Diagnosis   Malignant neoplasm of cervix ([place])   08/30/2021 PET scan   Focal hypermetabolism in the region of the cervix, consistent with known primary cervical carcinoma.   No evidence of pelvic lymph node or distant metastatic disease.     09/01/2021 Cancer Staging   Staging form: Cervix Uteri, AJCC Version 9 - Clinical stage from 09/01/2021: FIGO Stage IB1 (cT1b1, cN0, cM0) - Signed by Heath Lark, MD on 09/01/2021 Stage prefix: Initial diagnosis    09/08/2021 Procedure   Successful placement of a right internal jugular approach power injectable Port-A-Cath. The catheter is ready for immediate use.     09/13/2021 - 10/11/2021 Chemotherapy   Patient is on Treatment Plan : cervical Cisplatin q7d        PHYSICAL EXAMINATION: ECOG PERFORMANCE STATUS: 1 - Symptomatic but completely ambulatory  Vitals:   10/17/21 0921  BP: 99/67  Pulse: 84  Resp: 18  Temp: 98.1 F (36.7 C)  SpO2: 100%   Filed Weights   10/17/21 0921  Weight: 152 lb 12.8 oz (69.3 kg)    GENERAL:alert, no distress and comfortable  NEURO: alert &  oriented x 3 with fluent speech, no focal motor/sensory deficits  LABORATORY DATA:  I have reviewed the data as listed    Component Value Date/Time   NA 136 10/17/2021 0857   K 3.1 (L) 10/17/2021 0857   CL 100 10/17/2021 0857   CO2 27 10/17/2021 0857   GLUCOSE 113 (H) 10/17/2021 0857   BUN 17 10/17/2021 0857   CREATININE 0.85 10/17/2021 0857   CREATININE 0.70 11/03/2013 1005   CALCIUM 9.4 10/17/2021 0857   PROT 6.7 10/02/2021 1556   ALBUMIN 4.0 10/02/2021 1556   AST 28 10/02/2021 1556   ALT 32 10/02/2021 1556   ALKPHOS 63 10/02/2021 1556   BILITOT 0.4 10/02/2021 1556   GFRNONAA >60 10/17/2021 0857    No results found for: SPEP, UPEP  Lab Results  Component Value Date   WBC 2.3 (L) 10/17/2021   NEUTROABS 1.5 (L) 10/17/2021   HGB 11.2 (L) 10/17/2021   HCT 31.4 (L) 10/17/2021   MCV 92.6 10/17/2021   PLT 109 (L) 10/17/2021      Chemistry      Component Value Date/Time   NA 136 10/17/2021 0857  K 3.1 (L) 10/17/2021 0857   CL 100 10/17/2021 0857   CO2 27 10/17/2021 0857   BUN 17 10/17/2021 0857   CREATININE 0.85 10/17/2021 0857   CREATININE 0.70 11/03/2013 1005      Component Value Date/Time   CALCIUM 9.4 10/17/2021 0857   ALKPHOS 63 10/02/2021 1556   AST 28 10/02/2021 1556   ALT 32 10/02/2021 1556   BILITOT 0.4 10/02/2021 1556

## 2021-10-18 NOTE — Progress Notes (Signed)
Spoke w/ via phone for pre-op interview---Taylor Buchanan needs dos----   NONE            Buchanan results------ COVID test -----patient states asymptomatic no test needed Arrive at -------0530 NPO after MN NO Solid Food.  Med rec completed Medications to take morning of surgery ----- Lexapro and Protonix. Xanax, Lomotil Zofran and Compazine PRN. Diabetic medication ----- Patient instructed no nail polish to be worn day of surgery Patient instructed to bring photo id and insurance card day of surgery Patient aware to have Driver (ride ) Husband Taylor Buchanan / caregiver    for 24 hours after surgery  Patient Special Instructions ----- Pre-Op special Istructions ----- Patient verbalized understanding of instructions that were given at this phone interview. Patient denies shortness of breath, chest pain, fever, cough at this phone interview.

## 2021-10-18 NOTE — Assessment & Plan Note (Signed)
She has mild persistent pancytopenia due to recent treatment but not symptomatic Observe only

## 2021-10-18 NOTE — Assessment & Plan Note (Signed)
She is still recovering from side effects of treatment She have lost some weight but overall, she felt slightly better since last time I saw her We will continue supportive care She does not think she needs further IV fluid support I plan to see her again next month for further follow-up

## 2021-10-18 NOTE — Assessment & Plan Note (Signed)
This is better controlled with Imodium and Lomotil

## 2021-10-18 NOTE — Assessment & Plan Note (Signed)
This is due to recent treatment She will continue Imodium to control her diarrhea

## 2021-10-23 NOTE — H&P (View-Only) (Signed)
?Radiation Oncology         [phone] ?________________________________ ? ?History and physical examination ? ?Name: Taylor Buchanan MRN: [id number]  ?Date: 09/15/2021  DOB: 11-20-1955 ? ?DJ:SHFW, Karie Fetch, MD  No ref. provider found  ? ?REFERRING PHYSICIAN: No ref. provider found ? ?DIAGNOSIS: At least stage IB1 SCC grade 2 of the cervix (p16 positive) ? ?HISTORY OF PRESENT ILLNESS::Taylor Buchanan is a 66 y.o. female who is recently diagnosed cervical cancer. The patient presented this past September of 2022 with post-menopausal bleeding. Subsequently, the patient underwent a Pap smear on 05/11/21 which revealed ASCUS with + HR HPV. Per Dr. Berline Lopes, the patient also underwent pelvic ultrasounds prior to this which showed a thin endometrial lining. Endometrial biopsy performed on 05/18/21 showed fragments of squamous mucosa with high-grade dysplasia and scattered fragments of benign inactive endometrium.  ? ?Given these findings, the patient underwent endocervical biopsies on an unknown date (per Dr. Berline Lopes) showing koilocytic effect and squamous mucosa (CIN-1).  No high-grade dysplasia was identified.  ? ?Given persistent bleeding as well as high-grade dysplasia noted on endometrial biopsy, the patient underwent a cold knife cone cervical biopsy on 08/04/21 which revealed invasive squamous cell carcinoma (p16 positive), focally keratinizing, and moderately differentiated with circumferential involvement. All margins, including the deep margin were noted as positive for SCC. Endometrial curetting's also performed revealed blood clots containing numerous strips of squamous epithelium, with features of high-grade SIL (CIN-3/severe dysplasia). ? ?Subsequently, the patient was referred to Dr. Berline Lopes on 08/16/21 for further evaluation and recommendation regarding treatment. During this visit, the patient reported a year of postmenopausal bleeding, detailed as very light spotting for a couple of days, followed by sometimes  weeks without bleeding. When her bleeding episodes were heavy, the patient reported using a panty liner, and noted that heavy bleeding was typically preceded by intercourse. The patient also reported intermittent pelvic pain for which she takes Tylenol, though noted that she is unsure if this is related to her recent procedure. Pelvic exam performed during this visit revealed the cervix as mildly firm and flush with the vagina.  No obvious evidence of tumor extension to the vagina was appreciated. Exam of the uterus revealed the cervix as slightly more firm than normal on bimanual and rectovaginal exam.  Some mild thickening was appreciated along the left parametria, though Dr. Berline Lopes noted that it was difficult to tell if this was related to her recent cone biopsy or cancer. ? ?Cervical length on ultrasound from Dr. Ivor Costa office September 8 was estimated to be 2.63 cm ? ?Pertinent imaging this far includes: ?--PET on 08/30/21 which demonstrated the focal hypermetabolism in the region of the cervix, consistent with known primary cervical carcinoma. No evidence of pelvic lymph node involvement, or distant metastatic disease was appreciated. ? ?The patient has recently completed her pelvic external beam radiation therapy along with radiosensitizing chemotherapy.  She did have significant problems with diarrhea but this issue has been improving with use of Imodium and Lomotil.  She did require some IV fluid supplementation through medical oncology. ? ?PREVIOUS RADIATION THERAPY: No ? ?PAST MEDICAL HISTORY:  ?Past Medical History:  ?Diagnosis Date  ? Abnormal Pap smear of cervix   ? Arthritis   ? Cervical cancer ([place])   ? Depression   ? GAD (generalized anxiety disorder)   ? GERD (gastroesophageal reflux disease)   ? History of migraine   ? History of transient ischemic attack (TIA) 01/2021  ? neruologist--- dr Leonie Man;  [facility] in [facility] ,  per note complicated migraine vs anxiety,  less likely TIA  ? HLD  (hyperlipidemia)   ? Osteoporosis   ? Pain disorder   ? Scoliosis   ? ? ?PAST SURGICAL HISTORY: ?Past Surgical History:  ?Procedure Laterality Date  ? CERVICAL CONIZATION W/BX N/A 08/04/2021  ? Procedure: CONIZATION CERVIX WITH BIOPSY: DILATION AND CURETTAGE OF UTERUS;  Surgeon: Sherlyn Hay, DO;  Location: [facility];  Service: Gynecology;  Laterality: N/A;  ? CHOLECYSTECTOMY OPEN  2005  ? approx  ? IR IMAGING GUIDED PORT INSERTION  09/08/2021  ? TENDON REPAIR Left 2008  ? Ankle/ foot  ? ? ?FAMILY HISTORY:  ?Family History  ?Problem Relation Age of Onset  ? Mental illness Mother   ? Stroke Mother   ? Dementia Mother   ? Alzheimer's disease Mother   ? Bladder Cancer Father   ? Healthy Brother   ? Healthy Brother   ? Healthy Brother   ? CAD Maternal Grandmother   ? CAD Maternal Grandfather   ? Healthy Son   ? Healthy Son   ? Colon cancer Neg Hx   ? Breast cancer Neg Hx   ? Ovarian cancer Neg Hx   ? Endometrial cancer Neg Hx   ? Pancreatic cancer Neg Hx   ? Prostate cancer Neg Hx   ? ? ?SOCIAL HISTORY:  ?Social History  ? ?Tobacco Use  ? Smoking status: Former  ?  Packs/day: 1.00  ?  Years: 15.00  ?  Pack years: 15.00  ?  Types: Cigarettes  ?  Quit date: 1997  ?  Years since quitting: 26.1  ? Smokeless tobacco: Never  ?Vaping Use  ? Vaping Use: Never used  ?Substance Use Topics  ? Alcohol use: Yes  ?  Alcohol/week: 3.0 standard drinks  ?  Types: 3 Standard drinks or equivalent per week  ?  Comment: occ  ? Drug use: Never  ? ? ?ALLERGIES: No Known Allergies ? ?MEDICATIONS:  ?No current facility-administered medications for this encounter.  ? ?Current Outpatient Medications  ?Medication Sig Dispense Refill  ? aspirin EC 81 MG EC tablet Take 1 tablet (81 mg total) by mouth daily. Swallow whole. 30 tablet 11  ? atorvastatin (LIPITOR) 40 MG tablet Take 1 tablet (40 mg total) by mouth at bedtime. 90 tablet 3  ? Calcium Carbonate (CALCIUM-CARB 600 PO) Take 1 tablet by mouth daily.    ? cholecalciferol  (VITAMIN D) 1000 UNITS tablet Take 1,000 Units by mouth daily.    ? dexamethasone (DECADRON) 4 MG tablet Take 1 tablet (4 mg total) by mouth daily. 30 tablet 0  ? diphenoxylate-atropine (LOMOTIL) 2.5-0.025 MG tablet Take 1-2 tablets by mouth 4 (four) times daily as needed for diarrhea or loose stools. 60 tablet 0  ? escitalopram (LEXAPRO) 10 MG tablet Take 1 tablet (10 mg total) by mouth daily. 90 tablet 3  ? ondansetron (ZOFRAN) 8 MG tablet Take 1 tablet by mouth every 8  hours as needed. 30 tablet 1  ? pantoprazole (PROTONIX) 20 MG tablet Take 1 tablet (20 mg total) by mouth daily. 90 tablet 3  ? prochlorperazine (COMPAZINE) 10 MG tablet Take 1 tablet  by mouth every 6 hours as needed (Nausea or vomiting). 30 tablet 1  ? acetaminophen (TYLENOL) 500 MG tablet Take 500 mg by mouth as needed.    ? alendronate (FOSAMAX) 70 MG tablet Take 1 tablet (70 mg total) by mouth once a week. Take with  a full glass of water on an empty stomach and sit upright for 30 minutes. (Patient taking differently: Take 70 mg by mouth every Sunday. Take with a full glass of water on an empty stomach and sit upright for 30 minutes. ?Sunday's) 12 tablet 3  ? ALPRAZolam (XANAX) 0.5 MG tablet Take 0.5-1 tablets (0.25-0.5 mg total) by mouth daily as needed for anxiety (panic symptoms). 30 tablet 0  ? lidocaine-prilocaine (EMLA) cream Apply to affected area once 30 g 3  ? ? ?REVIEW OF SYSTEMS:  A 10+ POINT REVIEW OF SYSTEMS WAS OBTAINED including neurology, dermatology, psychiatry, cardiac, respiratory, lymph, extremities, GI, GU, musculoskeletal, constitutional, reproductive, HEENT.  She denies any pelvic pain.  She denies any urinary symptoms such as incontinence or dysuria.  ? ?PHYSICAL EXAM:  height is 5' 6.5" (1.689 m) and weight is 155 lb (70.3 kg).   ?General: Alert and oriented, in no acute distress ?HEENT: Head is normocephalic. Extraocular movements are intact.  ?Neck: Neck is supple, no palpable cervical or supraclavicular  lymphadenopathy. ?Heart: Regular in rate and rhythm with no murmurs, rubs, or gallops. ?Chest: Clear to auscultation bilaterally, with no rhonchi, wheezes, or rales. ?Abdomen: Soft, nontender, nondistended, with no r

## 2021-10-23 NOTE — H&P (View-Only) (Signed)
?Radiation Oncology         [phone] ?________________________________ ? ?History and physical examination ? ?Name: Taylor Buchanan MRN: [id number]  ?Date: 09/15/2021  DOB: 1955-09-21 ? ?NA:TFTD, Taylor Fetch, MD  No ref. provider found  ? ?REFERRING PHYSICIAN: No ref. provider found ? ?DIAGNOSIS: At least stage IB1 SCC grade 2 of the cervix (p16 positive) ? ?HISTORY OF PRESENT ILLNESS::Taylor Buchanan is a 66 y.o. female who is recently diagnosed cervical cancer. The patient presented this past September of 2022 with post-menopausal bleeding. Subsequently, the patient underwent a Pap smear on 05/11/21 which revealed ASCUS with + HR HPV. Per Dr. Berline Lopes, the patient also underwent pelvic ultrasounds prior to this which showed a thin endometrial lining. Endometrial biopsy performed on 05/18/21 showed fragments of squamous mucosa with high-grade dysplasia and scattered fragments of benign inactive endometrium.  ? ?Given these findings, the patient underwent endocervical biopsies on an unknown date (per Dr. Berline Lopes) showing koilocytic effect and squamous mucosa (CIN-1).  No high-grade dysplasia was identified.  ? ?Given persistent bleeding as well as high-grade dysplasia noted on endometrial biopsy, the patient underwent a cold knife cone cervical biopsy on 08/04/21 which revealed invasive squamous cell carcinoma (p16 positive), focally keratinizing, and moderately differentiated with circumferential involvement. All margins, including the deep margin were noted as positive for SCC. Endometrial curetting's also performed revealed blood clots containing numerous strips of squamous epithelium, with features of high-grade SIL (CIN-3/severe dysplasia). ? ?Subsequently, the patient was referred to Dr. Berline Lopes on 08/16/21 for further evaluation and recommendation regarding treatment. During this visit, the patient reported a year of postmenopausal bleeding, detailed as very light spotting for a couple of days, followed by sometimes  weeks without bleeding. When her bleeding episodes were heavy, the patient reported using a panty liner, and noted that heavy bleeding was typically preceded by intercourse. The patient also reported intermittent pelvic pain for which she takes Tylenol, though noted that she is unsure if this is related to her recent procedure. Pelvic exam performed during this visit revealed the cervix as mildly firm and flush with the vagina.  No obvious evidence of tumor extension to the vagina was appreciated. Exam of the uterus revealed the cervix as slightly more firm than normal on bimanual and rectovaginal exam.  Some mild thickening was appreciated along the left parametria, though Dr. Berline Lopes noted that it was difficult to tell if this was related to her recent cone biopsy or cancer. ? ?Cervical length on ultrasound from Dr. Ivor Costa office September 8 was estimated to be 2.63 cm ? ?Pertinent imaging this far includes: ?--PET on 08/30/21 which demonstrated the focal hypermetabolism in the region of the cervix, consistent with known primary cervical carcinoma. No evidence of pelvic lymph node involvement, or distant metastatic disease was appreciated. ? ?The patient has recently completed her pelvic external beam radiation therapy along with radiosensitizing chemotherapy.  She did have significant problems with diarrhea but this issue has been improving with use of Imodium and Lomotil.  She did require some IV fluid supplementation through medical oncology. ? ?PREVIOUS RADIATION THERAPY: No ? ?PAST MEDICAL HISTORY:  ?Past Medical History:  ?Diagnosis Date  ? Abnormal Pap smear of cervix   ? Arthritis   ? Cervical cancer ([place])   ? Depression   ? GAD (generalized anxiety disorder)   ? GERD (gastroesophageal reflux disease)   ? History of migraine   ? History of transient ischemic attack (TIA) 01/2021  ? neruologist--- dr Leonie Man;  [facility]/  2022 in [facility] ,  per note complicated migraine vs anxiety,  less likely TIA  ? HLD  (hyperlipidemia)   ? Osteoporosis   ? Pain disorder   ? Scoliosis   ? ? ?PAST SURGICAL HISTORY: ?Past Surgical History:  ?Procedure Laterality Date  ? CERVICAL CONIZATION W/BX N/A 08/04/2021  ? Procedure: CONIZATION CERVIX WITH BIOPSY: DILATION AND CURETTAGE OF UTERUS;  Surgeon: Sherlyn Hay, DO;  Location: [facility];  Service: Gynecology;  Laterality: N/A;  ? CHOLECYSTECTOMY OPEN  2005  ? approx  ? IR IMAGING GUIDED PORT INSERTION  09/08/2021  ? TENDON REPAIR Left 2008  ? Ankle/ foot  ? ? ?FAMILY HISTORY:  ?Family History  ?Problem Relation Age of Onset  ? Mental illness Mother   ? Stroke Mother   ? Dementia Mother   ? Alzheimer's disease Mother   ? Bladder Cancer Father   ? Healthy Brother   ? Healthy Brother   ? Healthy Brother   ? CAD Maternal Grandmother   ? CAD Maternal Grandfather   ? Healthy Son   ? Healthy Son   ? Colon cancer Neg Hx   ? Breast cancer Neg Hx   ? Ovarian cancer Neg Hx   ? Endometrial cancer Neg Hx   ? Pancreatic cancer Neg Hx   ? Prostate cancer Neg Hx   ? ? ?SOCIAL HISTORY:  ?Social History  ? ?Tobacco Use  ? Smoking status: Former  ?  Packs/day: 1.00  ?  Years: 15.00  ?  Pack years: 15.00  ?  Types: Cigarettes  ?  Quit date: 1997  ?  Years since quitting: 26.1  ? Smokeless tobacco: Never  ?Vaping Use  ? Vaping Use: Never used  ?Substance Use Topics  ? Alcohol use: Yes  ?  Alcohol/week: 3.0 standard drinks  ?  Types: 3 Standard drinks or equivalent per week  ?  Comment: occ  ? Drug use: Never  ? ? ?ALLERGIES: No Known Allergies ? ?MEDICATIONS:  ?No current facility-administered medications for this encounter.  ? ?Current Outpatient Medications  ?Medication Sig Dispense Refill  ? aspirin EC 81 MG EC tablet Take 1 tablet (81 mg total) by mouth daily. Swallow whole. 30 tablet 11  ? atorvastatin (LIPITOR) 40 MG tablet Take 1 tablet (40 mg total) by mouth at bedtime. 90 tablet 3  ? Calcium Carbonate (CALCIUM-CARB 600 PO) Take 1 tablet by mouth daily.    ? cholecalciferol  (VITAMIN D) 1000 UNITS tablet Take 1,000 Units by mouth daily.    ? dexamethasone (DECADRON) 4 MG tablet Take 1 tablet (4 mg total) by mouth daily. 30 tablet 0  ? diphenoxylate-atropine (LOMOTIL) 2.5-0.025 MG tablet Take 1-2 tablets by mouth 4 (four) times daily as needed for diarrhea or loose stools. 60 tablet 0  ? escitalopram (LEXAPRO) 10 MG tablet Take 1 tablet (10 mg total) by mouth daily. 90 tablet 3  ? ondansetron (ZOFRAN) 8 MG tablet Take 1 tablet by mouth every 8  hours as needed. 30 tablet 1  ? pantoprazole (PROTONIX) 20 MG tablet Take 1 tablet (20 mg total) by mouth daily. 90 tablet 3  ? prochlorperazine (COMPAZINE) 10 MG tablet Take 1 tablet  by mouth every 6 hours as needed (Nausea or vomiting). 30 tablet 1  ? acetaminophen (TYLENOL) 500 MG tablet Take 500 mg by mouth as needed.    ? alendronate (FOSAMAX) 70 MG tablet Take 1 tablet (70 mg total) by mouth once a week. Take with  a full glass of water on an empty stomach and sit upright for 30 minutes. (Patient taking differently: Take 70 mg by mouth every Sunday. Take with a full glass of water on an empty stomach and sit upright for 30 minutes. ?Sunday's) 12 tablet 3  ? ALPRAZolam (XANAX) 0.5 MG tablet Take 0.5-1 tablets (0.25-0.5 mg total) by mouth daily as needed for anxiety (panic symptoms). 30 tablet 0  ? lidocaine-prilocaine (EMLA) cream Apply to affected area once 30 g 3  ? ? ?REVIEW OF SYSTEMS:  A 10+ POINT REVIEW OF SYSTEMS WAS OBTAINED including neurology, dermatology, psychiatry, cardiac, respiratory, lymph, extremities, GI, GU, musculoskeletal, constitutional, reproductive, HEENT.  She denies any pelvic pain.  She denies any urinary symptoms such as incontinence or dysuria.  ? ?PHYSICAL EXAM:  height is 5' 6.5" (1.689 m) and weight is 155 lb (70.3 kg).   ?General: Alert and oriented, in no acute distress ?HEENT: Head is normocephalic. Extraocular movements are intact.  ?Neck: Neck is supple, no palpable cervical or supraclavicular  lymphadenopathy. ?Heart: Regular in rate and rhythm with no murmurs, rubs, or gallops. ?Chest: Clear to auscultation bilaterally, with no rhonchi, wheezes, or rales. ?Abdomen: Soft, nontender, nondistended, with no r

## 2021-10-23 NOTE — H&P (View-Only) (Signed)
?Radiation Oncology         [phone] ?________________________________ ? ?History and physical examination ? ?Name: Taylor Buchanan MRN: [id number]  ?Date: 09/15/2021  DOB: 01-26-1956 ? ?HK:VQQV, Karie Fetch, MD  No ref. provider found  ? ?REFERRING PHYSICIAN: No ref. provider found ? ?DIAGNOSIS: At least stage IB1 SCC grade 2 of the cervix (p16 positive) ? ?HISTORY OF PRESENT ILLNESS::Taylor Buchanan is a 66 y.o. female who is recently diagnosed cervical cancer. The patient presented this past September of 2022 with post-menopausal bleeding. Subsequently, the patient underwent a Pap smear on 05/11/21 which revealed ASCUS with + HR HPV. Per Dr. Berline Lopes, the patient also underwent pelvic ultrasounds prior to this which showed a thin endometrial lining. Endometrial biopsy performed on 05/18/21 showed fragments of squamous mucosa with high-grade dysplasia and scattered fragments of benign inactive endometrium.  ? ?Given these findings, the patient underwent endocervical biopsies on an unknown date (per Dr. Berline Lopes) showing koilocytic effect and squamous mucosa (CIN-1).  No high-grade dysplasia was identified.  ? ?Given persistent bleeding as well as high-grade dysplasia noted on endometrial biopsy, the patient underwent a cold knife cone cervical biopsy on 08/04/21 which revealed invasive squamous cell carcinoma (p16 positive), focally keratinizing, and moderately differentiated with circumferential involvement. All margins, including the deep margin were noted as positive for SCC. Endometrial curetting's also performed revealed blood clots containing numerous strips of squamous epithelium, with features of high-grade SIL (CIN-3/severe dysplasia). ? ?Subsequently, the patient was referred to Dr. Berline Lopes on 08/16/21 for further evaluation and recommendation regarding treatment. During this visit, the patient reported a year of postmenopausal bleeding, detailed as very light spotting for a couple of days, followed by sometimes  weeks without bleeding. When her bleeding episodes were heavy, the patient reported using a panty liner, and noted that heavy bleeding was typically preceded by intercourse. The patient also reported intermittent pelvic pain for which she takes Tylenol, though noted that she is unsure if this is related to her recent procedure. Pelvic exam performed during this visit revealed the cervix as mildly firm and flush with the vagina.  No obvious evidence of tumor extension to the vagina was appreciated. Exam of the uterus revealed the cervix as slightly more firm than normal on bimanual and rectovaginal exam.  Some mild thickening was appreciated along the left parametria, though Dr. Berline Lopes noted that it was difficult to tell if this was related to her recent cone biopsy or cancer. ? ?Cervical length on ultrasound from Dr. Ivor Costa office September 8 was estimated to be 2.63 cm ? ?Pertinent imaging this far includes: ?--PET on 08/30/21 which demonstrated the focal hypermetabolism in the region of the cervix, consistent with known primary cervical carcinoma. No evidence of pelvic lymph node involvement, or distant metastatic disease was appreciated. ? ?The patient has recently completed her pelvic external beam radiation therapy along with radiosensitizing chemotherapy.  She did have significant problems with diarrhea but this issue has been improving with use of Imodium and Lomotil.  She did require some IV fluid supplementation through medical oncology. ? ?PREVIOUS RADIATION THERAPY: No ? ?PAST MEDICAL HISTORY:  ?Past Medical History:  ?Diagnosis Date  ? Abnormal Pap smear of cervix   ? Arthritis   ? Cervical cancer ([place])   ? Depression   ? GAD (generalized anxiety disorder)   ? GERD (gastroesophageal reflux disease)   ? History of migraine   ? History of transient ischemic attack (TIA) 01/2021  ? neruologist--- dr Leonie Man;  [facility]/  2022 in [facility] ,  per note complicated migraine vs anxiety,  less likely TIA  ? HLD  (hyperlipidemia)   ? Osteoporosis   ? Pain disorder   ? Scoliosis   ? ? ?PAST SURGICAL HISTORY: ?Past Surgical History:  ?Procedure Laterality Date  ? CERVICAL CONIZATION W/BX N/A 08/04/2021  ? Procedure: CONIZATION CERVIX WITH BIOPSY: DILATION AND CURETTAGE OF UTERUS;  Surgeon: Sherlyn Hay, DO;  Location: [facility];  Service: Gynecology;  Laterality: N/A;  ? CHOLECYSTECTOMY OPEN  2005  ? approx  ? IR IMAGING GUIDED PORT INSERTION  09/08/2021  ? TENDON REPAIR Left 2008  ? Ankle/ foot  ? ? ?FAMILY HISTORY:  ?Family History  ?Problem Relation Age of Onset  ? Mental illness Mother   ? Stroke Mother   ? Dementia Mother   ? Alzheimer's disease Mother   ? Bladder Cancer Father   ? Healthy Brother   ? Healthy Brother   ? Healthy Brother   ? CAD Maternal Grandmother   ? CAD Maternal Grandfather   ? Healthy Son   ? Healthy Son   ? Colon cancer Neg Hx   ? Breast cancer Neg Hx   ? Ovarian cancer Neg Hx   ? Endometrial cancer Neg Hx   ? Pancreatic cancer Neg Hx   ? Prostate cancer Neg Hx   ? ? ?SOCIAL HISTORY:  ?Social History  ? ?Tobacco Use  ? Smoking status: Former  ?  Packs/day: 1.00  ?  Years: 15.00  ?  Pack years: 15.00  ?  Types: Cigarettes  ?  Quit date: 1997  ?  Years since quitting: 26.1  ? Smokeless tobacco: Never  ?Vaping Use  ? Vaping Use: Never used  ?Substance Use Topics  ? Alcohol use: Yes  ?  Alcohol/week: 3.0 standard drinks  ?  Types: 3 Standard drinks or equivalent per week  ?  Comment: occ  ? Drug use: Never  ? ? ?ALLERGIES: No Known Allergies ? ?MEDICATIONS:  ?No current facility-administered medications for this encounter.  ? ?Current Outpatient Medications  ?Medication Sig Dispense Refill  ? aspirin EC 81 MG EC tablet Take 1 tablet (81 mg total) by mouth daily. Swallow whole. 30 tablet 11  ? atorvastatin (LIPITOR) 40 MG tablet Take 1 tablet (40 mg total) by mouth at bedtime. 90 tablet 3  ? Calcium Carbonate (CALCIUM-CARB 600 PO) Take 1 tablet by mouth daily.    ? cholecalciferol  (VITAMIN D) 1000 UNITS tablet Take 1,000 Units by mouth daily.    ? dexamethasone (DECADRON) 4 MG tablet Take 1 tablet (4 mg total) by mouth daily. 30 tablet 0  ? diphenoxylate-atropine (LOMOTIL) 2.5-0.025 MG tablet Take 1-2 tablets by mouth 4 (four) times daily as needed for diarrhea or loose stools. 60 tablet 0  ? escitalopram (LEXAPRO) 10 MG tablet Take 1 tablet (10 mg total) by mouth daily. 90 tablet 3  ? ondansetron (ZOFRAN) 8 MG tablet Take 1 tablet by mouth every 8  hours as needed. 30 tablet 1  ? pantoprazole (PROTONIX) 20 MG tablet Take 1 tablet (20 mg total) by mouth daily. 90 tablet 3  ? prochlorperazine (COMPAZINE) 10 MG tablet Take 1 tablet  by mouth every 6 hours as needed (Nausea or vomiting). 30 tablet 1  ? acetaminophen (TYLENOL) 500 MG tablet Take 500 mg by mouth as needed.    ? alendronate (FOSAMAX) 70 MG tablet Take 1 tablet (70 mg total) by mouth once a week. Take with  a full glass of water on an empty stomach and sit upright for 30 minutes. (Patient taking differently: Take 70 mg by mouth every Sunday. Take with a full glass of water on an empty stomach and sit upright for 30 minutes. ?Sunday's) 12 tablet 3  ? ALPRAZolam (XANAX) 0.5 MG tablet Take 0.5-1 tablets (0.25-0.5 mg total) by mouth daily as needed for anxiety (panic symptoms). 30 tablet 0  ? lidocaine-prilocaine (EMLA) cream Apply to affected area once 30 g 3  ? ? ?REVIEW OF SYSTEMS:  A 10+ POINT REVIEW OF SYSTEMS WAS OBTAINED including neurology, dermatology, psychiatry, cardiac, respiratory, lymph, extremities, GI, GU, musculoskeletal, constitutional, reproductive, HEENT.  She denies any pelvic pain.  She denies any urinary symptoms such as incontinence or dysuria.  ? ?PHYSICAL EXAM:  height is 5' 6.5" (1.689 m) and weight is 155 lb (70.3 kg).   ?General: Alert and oriented, in no acute distress ?HEENT: Head is normocephalic. Extraocular movements are intact.  ?Neck: Neck is supple, no palpable cervical or supraclavicular  lymphadenopathy. ?Heart: Regular in rate and rhythm with no murmurs, rubs, or gallops. ?Chest: Clear to auscultation bilaterally, with no rhonchi, wheezes, or rales. ?Abdomen: Soft, nontender, nondistended, with no r

## 2021-10-23 NOTE — H&P (Signed)
Radiation Oncology         (336) [phone] ________________________________  History and physical examination  Name: Taylor Buchanan MRN: [id number]  Date: 09/15/2021  DOB: 05-19-1956  CC:Leamon Arnt, MD  No ref. provider found   REFERRING PHYSICIAN: No ref. provider found  DIAGNOSIS: At least stage IB1 SCC grade 2 of the cervix (p16 positive)  HISTORY OF PRESENT ILLNESS::Taylor Buchanan is a 66 y.o. female who is recently diagnosed cervical cancer. The patient presented this past September of 2022 with post-menopausal bleeding. Subsequently, the patient underwent a Pap smear on 05/11/21 which revealed ASCUS with + HR HPV. Per Dr. Berline Lopes, the patient also underwent pelvic ultrasounds prior to this which showed a thin endometrial lining. Endometrial biopsy performed on 05/18/21 showed fragments of squamous mucosa with high-grade dysplasia and scattered fragments of benign inactive endometrium.   Given these findings, the patient underwent endocervical biopsies on an unknown date (per Dr. Berline Lopes) showing koilocytic effect and squamous mucosa (CIN-1).  No high-grade dysplasia was identified.   Given persistent bleeding as well as high-grade dysplasia noted on endometrial biopsy, the patient underwent a cold knife cone cervical biopsy on 08/04/21 which revealed invasive squamous cell carcinoma (p16 positive), focally keratinizing, and moderately differentiated with circumferential involvement. All margins, including the deep margin were noted as positive for SCC. Endometrial curetting's also performed revealed blood clots containing numerous strips of squamous epithelium, with features of high-grade SIL (CIN-3/severe dysplasia).  Subsequently, the patient was referred to Dr. Berline Lopes on 08/16/21 for further evaluation and recommendation regarding treatment. During this visit, the patient reported a year of postmenopausal bleeding, detailed as very light spotting for a couple of days, followed by sometimes  weeks without bleeding. When her bleeding episodes were heavy, the patient reported using a panty liner, and noted that heavy bleeding was typically preceded by intercourse. The patient also reported intermittent pelvic pain for which she takes Tylenol, though noted that she is unsure if this is related to her recent procedure. Pelvic exam performed during this visit revealed the cervix as mildly firm and flush with the vagina.  No obvious evidence of tumor extension to the vagina was appreciated. Exam of the uterus revealed the cervix as slightly more firm than normal on bimanual and rectovaginal exam.  Some mild thickening was appreciated along the left parametria, though Dr. Berline Lopes noted that it was difficult to tell if this was related to her recent cone biopsy or cancer.  Cervical length on ultrasound from Dr. Ivor Costa office September 8 was estimated to be 2.63 cm  Pertinent imaging this far includes: --PET on 08/30/21 which demonstrated the focal hypermetabolism in the region of the cervix, consistent with known primary cervical carcinoma. No evidence of pelvic lymph node involvement, or distant metastatic disease was appreciated.  The patient has recently completed her pelvic external beam radiation therapy along with radiosensitizing chemotherapy.  She did have significant problems with diarrhea but this issue has been improving with use of Imodium and Lomotil.  She did require some IV fluid supplementation through medical oncology.  PREVIOUS RADIATION THERAPY: No  PAST MEDICAL HISTORY:  Past Medical History:  Diagnosis Date   Abnormal Pap smear of cervix    Arthritis    Cervical cancer (HCC)    Depression    GAD (generalized anxiety disorder)    GERD (gastroesophageal reflux disease)    History of migraine    History of transient ischemic attack (TIA) 01/2021   neruologist--- dr Leonie Man;  [facility] in [facility] ,  per note complicated migraine vs anxiety,  less likely TIA   HLD  (hyperlipidemia)    Osteoporosis    Pain disorder    Scoliosis     PAST SURGICAL HISTORY: Past Surgical History:  Procedure Laterality Date   CERVICAL CONIZATION W/BX N/A 08/04/2021   Procedure: CONIZATION CERVIX WITH BIOPSY: DILATION AND CURETTAGE OF UTERUS;  Surgeon: Sherlyn Hay, DO;  Location: [facility];  Service: Gynecology;  Laterality: N/A;   CHOLECYSTECTOMY OPEN  2005   approx   IR IMAGING GUIDED PORT INSERTION  09/08/2021   TENDON REPAIR Left 2008   Ankle/ foot    FAMILY HISTORY:  Family History  Problem Relation Age of Onset   Mental illness Mother    Stroke Mother    Dementia Mother    Alzheimer's disease Mother    Bladder Cancer Father    Healthy Brother    Healthy Brother    Healthy Brother    CAD Maternal Grandmother    CAD Maternal Grandfather    Healthy Son    Healthy Son    Colon cancer Neg Hx    Breast cancer Neg Hx    Ovarian cancer Neg Hx    Endometrial cancer Neg Hx    Pancreatic cancer Neg Hx    Prostate cancer Neg Hx     SOCIAL HISTORY:  Social History   Tobacco Use   Smoking status: Former    Packs/day: 1.00    Years: 15.00    Pack years: 15.00    Types: Cigarettes    Quit date: 1997    Years since quitting: 26.1   Smokeless tobacco: Never  Vaping Use   Vaping Use: Never used  Substance Use Topics   Alcohol use: Yes    Alcohol/week: 3.0 standard drinks    Types: 3 Standard drinks or equivalent per week    Comment: occ   Drug use: Never    ALLERGIES: No Known Allergies  MEDICATIONS:  No current facility-administered medications for this encounter.   Current Outpatient Medications  Medication Sig Dispense Refill   aspirin EC 81 MG EC tablet Take 1 tablet (81 mg total) by mouth daily. Swallow whole. 30 tablet 11   atorvastatin (LIPITOR) 40 MG tablet Take 1 tablet (40 mg total) by mouth at bedtime. 90 tablet 3   Calcium Carbonate (CALCIUM-CARB 600 PO) Take 1 tablet by mouth daily.     cholecalciferol  (VITAMIN D) 1000 UNITS tablet Take 1,000 Units by mouth daily.     dexamethasone (DECADRON) 4 MG tablet Take 1 tablet (4 mg total) by mouth daily. 30 tablet 0   diphenoxylate-atropine (LOMOTIL) 2.5-0.025 MG tablet Take 1-2 tablets by mouth 4 (four) times daily as needed for diarrhea or loose stools. 60 tablet 0   escitalopram (LEXAPRO) 10 MG tablet Take 1 tablet (10 mg total) by mouth daily. 90 tablet 3   ondansetron (ZOFRAN) 8 MG tablet Take 1 tablet by mouth every 8  hours as needed. 30 tablet 1   pantoprazole (PROTONIX) 20 MG tablet Take 1 tablet (20 mg total) by mouth daily. 90 tablet 3   prochlorperazine (COMPAZINE) 10 MG tablet Take 1 tablet  by mouth every 6 hours as needed (Nausea or vomiting). 30 tablet 1   acetaminophen (TYLENOL) 500 MG tablet Take 500 mg by mouth as needed.     alendronate (FOSAMAX) 70 MG tablet Take 1 tablet (70 mg total) by mouth once a week. Take with  a full glass of water on an empty stomach and sit upright for 30 minutes. (Patient taking differently: Take 70 mg by mouth every Sunday. Take with a full glass of water on an empty stomach and sit upright for 30 minutes. Sunday's) 12 tablet 3   ALPRAZolam (XANAX) 0.5 MG tablet Take 0.5-1 tablets (0.25-0.5 mg total) by mouth daily as needed for anxiety (panic symptoms). 30 tablet 0   lidocaine-prilocaine (EMLA) cream Apply to affected area once 30 g 3    REVIEW OF SYSTEMS:  A 10+ POINT REVIEW OF SYSTEMS WAS OBTAINED including neurology, dermatology, psychiatry, cardiac, respiratory, lymph, extremities, GI, GU, musculoskeletal, constitutional, reproductive, HEENT.  She denies any pelvic pain.  She denies any urinary symptoms such as incontinence or dysuria.   PHYSICAL EXAM:  height is 5' 6.5" (1.689 m) and weight is 155 lb (70.3 kg).   General: Alert and oriented, in no acute distress HEENT: Head is normocephalic. Extraocular movements are intact.  Neck: Neck is supple, no palpable cervical or supraclavicular  lymphadenopathy. Heart: Regular in rate and rhythm with no murmurs, rubs, or gallops. Chest: Clear to auscultation bilaterally, with no rhonchi, wheezes, or rales. Abdomen: Soft, nontender, nondistended, with no rigidity or guarding. Extremities: No cyanosis or edema. Lymphatics: see Neck Exam Skin: No concerning lesions. Musculoskeletal: symmetric strength and muscle tone throughout. Neurologic: Cranial nerves II through XII are grossly intact. No obvious focalities. Speech is fluent. Coordination is intact. Psychiatric: Judgment and insight are intact. Affect is appropriate. On pelvic examination the external genitalia were unremarkable. A speculum exam was performed.  The cervical area was noted to have surgical changes consistent with recent cone.  On bimanual and rectovaginal examination the cervix os was noted to be expanded consistent with recent cone. The cervix felt to be somewhat firm and slightly enlarged.  No obvious parametrial extension but difficult to evaluate in the outpatient setting.    LABORATORY DATA:  Lab Results  Component Value Date   WBC 2.3 (L) 10/17/2021   HGB 11.2 (L) 10/17/2021   HCT 31.4 (L) 10/17/2021   MCV 92.6 10/17/2021   PLT 109 (L) 10/17/2021   NEUTROABS 1.5 (L) 10/17/2021   Lab Results  Component Value Date   NA 136 10/17/2021   K 3.1 (L) 10/17/2021   CL 100 10/17/2021   CO2 27 10/17/2021   GLUCOSE 113 (H) 10/17/2021   CREATININE 0.85 10/17/2021   CALCIUM 9.4 10/17/2021      RADIOGRAPHY: No results found.    IMPRESSION: At least stage IB1 SCC grade 2 of the cervix (p16 positive)  She has recently completed her external beam and radiosensitizing chemotherapy.  She is now ready to proceed with brachytherapy.   PLAN: She will be taken to the operating room on March 7 at 7:30 AM.  She will proceed to have an exam under anesthesia by Dr. Berline Lopes.  Depending on results of this exam the patient will either proceed with 1 high-dose-rate treatment  directed at the cervical area followed by an extrafascial hysterectomy or proceed with definitive treatments which would include a total of 5 high-dose-rate brachytherapy treatments directed at the cervix.    ------------------------------------------------  Blair Promise, PhD, MD  This document serves as a record of services personally performed by Gery Pray, MD. It was created on his behalf by Roney Mans, a trained medical scribe. The creation of this record is based on the scribe's personal observations and the provider's statements to them. This document has been checked and  approved by the attending provider.

## 2021-10-23 NOTE — H&P (View-Only) (Signed)
?Radiation Oncology         (336) [phone] ?________________________________ ? ?History and physical examination ? ?Name: Taylor Buchanan MRN: [id number]  ?Date: 09/15/2021  DOB: [birth year] ? ?RW:ERXV, Karie Fetch, MD  No ref. provider found  ? ?REFERRING PHYSICIAN: No ref. provider found ? ?DIAGNOSIS: At least stage IB1 SCC grade 2 of the cervix (p16 positive) ? ?HISTORY OF PRESENT ILLNESS::Taylor Buchanan is a 66 y.o. female who is recently diagnosed cervical cancer. The patient presented this past September of 2022 with post-menopausal bleeding. Subsequently, the patient underwent a Pap smear on 05/11/21 which revealed ASCUS with + HR HPV. Per Dr. Berline Lopes, the patient also underwent pelvic ultrasounds prior to this which showed a thin endometrial lining. Endometrial biopsy performed on 05/18/21 showed fragments of squamous mucosa with high-grade dysplasia and scattered fragments of benign inactive endometrium.  ? ?Given these findings, the patient underwent endocervical biopsies on an unknown date (per Dr. Berline Lopes) showing koilocytic effect and squamous mucosa (CIN-1).  No high-grade dysplasia was identified.  ? ?Given persistent bleeding as well as high-grade dysplasia noted on endometrial biopsy, the patient underwent a cold knife cone cervical biopsy on 08/04/21 which revealed invasive squamous cell carcinoma (p16 positive), focally keratinizing, and moderately differentiated with circumferential involvement. All margins, including the deep margin were noted as positive for SCC. Endometrial curetting's also performed revealed blood clots containing numerous strips of squamous epithelium, with features of high-grade SIL (CIN-3/severe dysplasia). ? ?Subsequently, the patient was referred to Dr. Berline Lopes on 08/16/21 for further evaluation and recommendation regarding treatment. During this visit, the patient reported a year of postmenopausal bleeding, detailed as very light spotting for a couple of days, followed by sometimes  weeks without bleeding. When her bleeding episodes were heavy, the patient reported using a panty liner, and noted that heavy bleeding was typically preceded by intercourse. The patient also reported intermittent pelvic pain for which she takes Tylenol, though noted that she is unsure if this is related to her recent procedure. Pelvic exam performed during this visit revealed the cervix as mildly firm and flush with the vagina.  No obvious evidence of tumor extension to the vagina was appreciated. Exam of the uterus revealed the cervix as slightly more firm than normal on bimanual and rectovaginal exam.  Some mild thickening was appreciated along the left parametria, though Dr. Berline Lopes noted that it was difficult to tell if this was related to her recent cone biopsy or cancer. ? ?Cervical length on ultrasound from Dr. Ivor Costa office September 8 was estimated to be 2.63 cm ? ?Pertinent imaging this far includes: ?--PET on 08/30/21 which demonstrated the focal hypermetabolism in the region of the cervix, consistent with known primary cervical carcinoma. No evidence of pelvic lymph node involvement, or distant metastatic disease was appreciated. ? ?The patient has recently completed her pelvic external beam radiation therapy along with radiosensitizing chemotherapy.  She did have significant problems with diarrhea but this issue has been improving with use of Imodium and Lomotil.  She did require some IV fluid supplementation through medical oncology. ? ?PREVIOUS RADIATION THERAPY: No ? ?PAST MEDICAL HISTORY:  ?Past Medical History:  ?Diagnosis Date  ? Abnormal Pap smear of cervix   ? Arthritis   ? Cervical cancer ([facility])   ? Depression   ? GAD (generalized anxiety disorder)   ? GERD (gastroesophageal reflux disease)   ? History of migraine   ? History of transient ischemic attack (TIA) 01/2021  ? neruologist--- dr Leonie Man;  [facility]/  20[facility] in [facility] ,  per note complicated migraine vs anxiety,  less likely TIA  ? HLD  (hyperlipidemia)   ? Osteoporosis   ? Pain disorder   ? Scoliosis   ? ? ?PAST SURGICAL HISTORY: ?Past Surgical History:  ?Procedure Laterality Date  ? CERVICAL CONIZATION W/BX N/A 08/04/2021  ? Procedure: CONIZATION CERVIX WITH BIOPSY: DILATION AND CURETTAGE OF UTERUS;  Surgeon: Sherlyn Hay, DO;  Location: [facility];  Service: Gynecology;  Laterality: N/A;  ? CHOLECYSTECTOMY OPEN  2005  ? approx  ? IR IMAGING GUIDED PORT INSERTION  09/08/2021  ? TENDON REPAIR Left 2008  ? Ankle/ foot  ? ? ?FAMILY HISTORY:  ?Family History  ?Problem Relation Age of Onset  ? Mental illness Mother   ? Stroke Mother   ? Dementia Mother   ? Alzheimer's disease Mother   ? Bladder Cancer Father   ? Healthy Brother   ? Healthy Brother   ? Healthy Brother   ? CAD Maternal Grandmother   ? CAD Maternal Grandfather   ? Healthy Son   ? Healthy Son   ? Colon cancer Neg Hx   ? Breast cancer Neg Hx   ? Ovarian cancer Neg Hx   ? Endometrial cancer Neg Hx   ? Pancreatic cancer Neg Hx   ? Prostate cancer Neg Hx   ? ? ?SOCIAL HISTORY:  ?Social History  ? ?Tobacco Use  ? Smoking status: Former  ?  Packs/day: 1.00  ?  Years: 15.00  ?  Pack years: 15.00  ?  Types: Cigarettes  ?  Quit date: 1997  ?  Years since quitting: 26.1  ? Smokeless tobacco: Never  ?Vaping Use  ? Vaping Use: Never used  ?Substance Use Topics  ? Alcohol use: Yes  ?  Alcohol/week: 3.0 standard drinks  ?  Types: 3 Standard drinks or equivalent per week  ?  Comment: occ  ? Drug use: Never  ? ? ?ALLERGIES: No Known Allergies ? ?MEDICATIONS:  ?No current facility-administered medications for this encounter.  ? ?Current Outpatient Medications  ?Medication Sig Dispense Refill  ? aspirin EC 81 MG EC tablet Take 1 tablet (81 mg total) by mouth daily. Swallow whole. 30 tablet 11  ? atorvastatin (LIPITOR) 40 MG tablet Take 1 tablet (40 mg total) by mouth at bedtime. 90 tablet 3  ? Calcium Carbonate (CALCIUM-CARB 600 PO) Take 1 tablet by mouth daily.    ? cholecalciferol  (VITAMIN D) 1000 UNITS tablet Take 1,000 Units by mouth daily.    ? dexamethasone (DECADRON) 4 MG tablet Take 1 tablet (4 mg total) by mouth daily. 30 tablet 0  ? diphenoxylate-atropine (LOMOTIL) 2.5-0.025 MG tablet Take 1-2 tablets by mouth 4 (four) times daily as needed for diarrhea or loose stools. 60 tablet 0  ? escitalopram (LEXAPRO) 10 MG tablet Take 1 tablet (10 mg total) by mouth daily. 90 tablet 3  ? ondansetron (ZOFRAN) 8 MG tablet Take 1 tablet by mouth every 8  hours as needed. 30 tablet 1  ? pantoprazole (PROTONIX) 20 MG tablet Take 1 tablet (20 mg total) by mouth daily. 90 tablet 3  ? prochlorperazine (COMPAZINE) 10 MG tablet Take 1 tablet  by mouth every 6 hours as needed (Nausea or vomiting). 30 tablet 1  ? acetaminophen (TYLENOL) 500 MG tablet Take 500 mg by mouth as needed.    ? alendronate (FOSAMAX) 70 MG tablet Take 1 tablet (70 mg total) by mouth once a week. Take with  a full glass of water on an empty stomach and sit upright for 30 minutes. (Patient taking differently: Take 70 mg by mouth every Sunday. Take with a full glass of water on an empty stomach and sit upright for 30 minutes. ?Sunday's) 12 tablet 3  ? ALPRAZolam (XANAX) 0.5 MG tablet Take 0.5-1 tablets (0.25-0.5 mg total) by mouth daily as needed for anxiety (panic symptoms). 30 tablet 0  ? lidocaine-prilocaine (EMLA) cream Apply to affected area once 30 g 3  ? ? ?REVIEW OF SYSTEMS:  A 10+ POINT REVIEW OF SYSTEMS WAS OBTAINED including neurology, dermatology, psychiatry, cardiac, respiratory, lymph, extremities, GI, GU, musculoskeletal, constitutional, reproductive, HEENT.  She denies any pelvic pain.  She denies any urinary symptoms such as incontinence or dysuria.  ? ?PHYSICAL EXAM:  height is 5' 6.5" (1.689 m) and weight is 155 lb (70.3 kg).   ?General: Alert and oriented, in no acute distress ?HEENT: Head is normocephalic. Extraocular movements are intact.  ?Neck: Neck is supple, no palpable cervical or supraclavicular  lymphadenopathy. ?Heart: Regular in rate and rhythm with no murmurs, rubs, or gallops. ?Chest: Clear to auscultation bilaterally, with no rhonchi, wheezes, or rales. ?Abdomen: Soft, nontender, nondistended, with no r

## 2021-10-24 ENCOUNTER — Encounter: Payer: Self-pay | Admitting: Radiation Oncology

## 2021-10-24 NOTE — Anesthesia Preprocedure Evaluation (Addendum)
Anesthesia Evaluation  ?Patient identified by MRN, date of birth, ID band ?Patient awake ? ? ? ?Reviewed: ?Allergy & Precautions, H&P , NPO status , Patient's Chart, lab work & pertinent test results ? ?Airway ?Mallampati: I ? ?TM Distance: >3 FB ?Neck ROM: Full ? ? ? Dental ?no notable dental hx. ?(+) Teeth Intact, Dental Advisory Given ?  ?Pulmonary ?neg pulmonary ROS, former smoker,  ?  ?Pulmonary exam normal ?breath sounds clear to auscultation ? ? ? ? ? ? Cardiovascular ?Exercise Tolerance: Good ?negative cardio ROS ? ? ?Rhythm:Regular Rate:Normal ? ? ?  ?Neuro/Psych ?Anxiety Depression TIA  ? GI/Hepatic ?Neg liver ROS, GERD  Medicated,  ?Endo/Other  ?negative endocrine ROS ? Renal/GU ?negative Renal ROS  ?negative genitourinary ?  ?Musculoskeletal ? ?(+) Arthritis , Osteoarthritis,   ? Abdominal ?  ?Peds ? Hematology ?negative hematology ROS ?(+)   ?Anesthesia Other Findings ? ? Reproductive/Obstetrics ?negative OB ROS ? ?  ? ? ? ? ? ? ? ? ? ? ? ? ? ?  ?  ? ? ? ? ? ? ? ?Anesthesia Physical ?Anesthesia Plan ? ?ASA: 2 ? ?Anesthesia Plan: General  ? ?Post-op Pain Management: Tylenol PO (pre-op)*  ? ?Induction: Intravenous ? ?PONV Risk Score and Plan: 4 or greater and Ondansetron, Midazolam, Dexamethasone, Propofol infusion, TIVA and Scopolamine patch - Pre-op ? ?Airway Management Planned: LMA ? ?Additional Equipment:  ? ?Intra-op Plan:  ? ?Post-operative Plan: Extubation in OR ? ?Informed Consent: I have reviewed the patients History and Physical, chart, labs and discussed the procedure including the risks, benefits and alternatives for the proposed anesthesia with the patient or authorized representative who has indicated his/her understanding and acceptance.  ? ? ? ?Dental advisory given ? ?Plan Discussed with: CRNA ? ?Anesthesia Plan Comments:   ? ? ? ? ? ?Anesthesia Quick Evaluation ? ?

## 2021-10-24 NOTE — Progress Notes (Signed)
?  Radiation Oncology         (336) [phone] ?________________________________ ? ?Name: Taylor Buchanan MRN: [id number]  ?Date: 10/25/2021  DOB: 01-21-1956 ? ?CC: Leamon Arnt, MD  Lafonda Mosses, MD ? ?HDR BRACHYTHERAPY NOTE ? ?DIAGNOSIS: The encounter diagnosis was Malignant neoplasm of cervix, unspecified site [facility]). ?  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 ?NARRATIVE: ?The patient was brought to the [facility] suite. Identity was confirmed. All relevant records and images related to the planned course of therapy were reviewed. The patient freely provided informed written consent to proceed with treatment after reviewing the details related to the planned course of therapy. The consent form was witnessed and verified by the simulation staff. Then, the patient was set-up in a stable reproducible supine position for radiation therapy. The tandem ring system was accessed and fiducial markers were placed within the tandem and ring.  ? ?Simple treatment device note: ?On the operating room the patient had construction of her custom tandem ring system. She will be treated with a 45? tandem/ring system. The patient had placement of a 40 mm tandem. A cervical ring with a small shielding was used for her treatment. A rectal paddle was also part of her custom set up device. ? ?Verification simulation note: ?An AP and lateral film was obtained through the pelvis area. This was compared to the patient's planning films documenting accurate position of the tandem/ring system for treatment. ? ?High-dose-rate brachytherapy treatment note:   ?The remote afterloading device was accessed through catheter system and attached to the tandem ring system. Patient then proceeded to undergo her first high-dose-rate treatment directed at the cervix. The patient was prescribed a dose of 5.5 gray to be delivered to the [place]. Patient was treated with 2 channels using 20 dwell positions. Treatment time was 330.9 seconds. The  patient tolerated the procedure well. After completion of her therapy, a radiation survey was performed documenting return of the iridium source into the [employer] safe. The patient was then transferred to the nursing suite.  She then had removal of the rectal paddle followed by the tandem and ring system. The patient tolerated the removal well. ? ?PLAN: The patient will return next week for her second high-dose-rate treatment. ?________________________________   ? ?----------------------------------- ? ?Blair Promise, PhD, MD ? ?This document serves as a record of services personally performed by Gery Pray, MD. It was created on his behalf by Roney Mans, a trained medical scribe. The creation of this record is based on the scribe's personal observations and the provider's statements to them. This document has been checked and approved by the attending provider. ? ? ?

## 2021-10-25 ENCOUNTER — Ambulatory Visit (HOSPITAL_BASED_OUTPATIENT_CLINIC_OR_DEPARTMENT_OTHER)
Admission: RE | Admit: 2021-10-25 | Discharge: 2021-10-25 | Disposition: A | Discharge status: Home / Self Care | Payer: Medicare Other | Source: Ambulatory Visit | Attending: Radiation Oncology | Admitting: Radiation Oncology

## 2021-10-25 ENCOUNTER — Encounter (HOSPITAL_BASED_OUTPATIENT_CLINIC_OR_DEPARTMENT_OTHER): Payer: Self-pay | Admitting: Radiation Oncology

## 2021-10-25 ENCOUNTER — Ambulatory Visit (HOSPITAL_BASED_OUTPATIENT_CLINIC_OR_DEPARTMENT_OTHER): Payer: Medicare Other | Admitting: Anesthesiology

## 2021-10-25 ENCOUNTER — Ambulatory Visit
Admission: RE | Admit: 2021-10-25 | Discharge: 2021-10-25 | Disposition: A | Discharge status: Home / Self Care | Payer: Medicare Other | Source: Ambulatory Visit | Attending: Radiation Oncology | Admitting: Radiation Oncology

## 2021-10-25 ENCOUNTER — Other Ambulatory Visit (HOSPITAL_COMMUNITY): Payer: Self-pay

## 2021-10-25 ENCOUNTER — Ambulatory Visit (HOSPITAL_COMMUNITY)
Admission: RE | Admit: 2021-10-25 | Discharge: 2021-10-25 | Disposition: A | Discharge status: Home / Self Care | Payer: Medicare Other | Source: Ambulatory Visit | Attending: Radiation Oncology | Admitting: Radiation Oncology

## 2021-10-25 ENCOUNTER — Encounter (HOSPITAL_BASED_OUTPATIENT_CLINIC_OR_DEPARTMENT_OTHER)
Admission: RE | Disposition: A | Discharge status: Home / Self Care | Payer: Self-pay | Source: Ambulatory Visit | Attending: Radiation Oncology

## 2021-10-25 ENCOUNTER — Other Ambulatory Visit: Payer: Self-pay

## 2021-10-25 VITALS — BP 109/69 | HR 66 | Temp 97.0°F | Resp 18

## 2021-10-25 DIAGNOSIS — Z8673 Personal history of transient ischemic attack (TIA), and cerebral infarction without residual deficits: Secondary | ICD-10-CM | POA: Diagnosis not present

## 2021-10-25 DIAGNOSIS — F418 Other specified anxiety disorders: Secondary | ICD-10-CM | POA: Diagnosis not present

## 2021-10-25 DIAGNOSIS — K219 Gastro-esophageal reflux disease without esophagitis: Secondary | ICD-10-CM | POA: Insufficient documentation

## 2021-10-25 DIAGNOSIS — C539 Malignant neoplasm of cervix uteri, unspecified: Secondary | ICD-10-CM

## 2021-10-25 DIAGNOSIS — Z87891 Personal history of nicotine dependence: Secondary | ICD-10-CM | POA: Insufficient documentation

## 2021-10-25 HISTORY — PX: OPERATIVE ULTRASOUND: SHX5996

## 2021-10-25 HISTORY — PX: TANDEM RING INSERTION: SHX6199

## 2021-10-25 SURGERY — INSERTION, UTERINE TANDEM AND RING OR CYLINDER, FOR BRACHYTHERAPY
Anesthesia: General | Site: Vagina

## 2021-10-25 MED ORDER — SODIUM CHLORIDE 0.9 % IR SOLN
Status: DC | PRN
  Administered 2021-10-25: 1000 mL via INTRAVESICAL

## 2021-10-25 MED ORDER — EPHEDRINE SULFATE (PRESSORS) 50 MG/ML IJ SOLN
INTRAMUSCULAR | Status: DC | PRN
  Administered 2021-10-25: 15 mg via INTRAVENOUS
  Administered 2021-10-25: 10 mg via INTRAVENOUS

## 2021-10-25 MED ORDER — FENTANYL CITRATE (PF) 100 MCG/2ML IJ SOLN: 25.0000 ug | INTRAMUSCULAR | Status: DC | PRN

## 2021-10-25 MED ORDER — LIDOCAINE HCL (CARDIAC) PF 100 MG/5ML IV SOSY
PREFILLED_SYRINGE | INTRAVENOUS | Status: DC | PRN
  Administered 2021-10-25: 50 mg via INTRAVENOUS

## 2021-10-25 MED ORDER — ONDANSETRON HCL 4 MG/2ML IJ SOLN
INTRAMUSCULAR | Status: DC | PRN
  Administered 2021-10-25: 4 mg via INTRAVENOUS

## 2021-10-25 MED ORDER — MIDAZOLAM HCL 2 MG/2ML IJ SOLN
INTRAMUSCULAR | Status: DC | PRN
  Administered 2021-10-25: 2 mg via INTRAVENOUS

## 2021-10-25 MED ORDER — SCOPOLAMINE 1 MG/3DAYS TD PT72
MEDICATED_PATCH | TRANSDERMAL | Status: AC
  Filled 2021-10-25: qty 1

## 2021-10-25 MED ORDER — MIDAZOLAM HCL 2 MG/2ML IJ SOLN
INTRAMUSCULAR | Status: AC
  Filled 2021-10-25: qty 2

## 2021-10-25 MED ORDER — FENTANYL CITRATE (PF) 100 MCG/2ML IJ SOLN
INTRAMUSCULAR | Status: DC | PRN
  Administered 2021-10-25 (×2): 50 ug via INTRAVENOUS

## 2021-10-25 MED ORDER — SCOPOLAMINE 1 MG/3DAYS TD PT72
1.0000 | MEDICATED_PATCH | TRANSDERMAL | Status: DC
  Administered 2021-10-25: 1.5 mg via TRANSDERMAL

## 2021-10-25 MED ORDER — PROPOFOL 10 MG/ML IV BOLUS
INTRAVENOUS | Status: DC | PRN
  Administered 2021-10-25: 100 mg via INTRAVENOUS
  Administered 2021-10-25: 50 mg via INTRAVENOUS

## 2021-10-25 MED ORDER — OXYCODONE-ACETAMINOPHEN 5-325 MG PO TABS
1.0000 | ORAL_TABLET | Freq: Four times a day (QID) | ORAL | 0 refills | Status: DC | PRN
  Filled 2021-10-25: qty 10, 3d supply, fill #0

## 2021-10-25 MED ORDER — DEXAMETHASONE SODIUM PHOSPHATE 10 MG/ML IJ SOLN
INTRAMUSCULAR | Status: DC | PRN
  Administered 2021-10-25: 10 mg via INTRAVENOUS

## 2021-10-25 MED ORDER — LACTATED RINGERS IV SOLN
INTRAVENOUS | Status: DC
  Filled 2021-10-25 (×2): qty 250

## 2021-10-25 MED ORDER — LIDOCAINE HCL (PF) 2 % IJ SOLN
INTRAMUSCULAR | Status: AC
  Filled 2021-10-25: qty 5

## 2021-10-25 MED ORDER — LACTATED RINGERS IV SOLN: INTRAVENOUS | Status: DC

## 2021-10-25 MED ORDER — LACTATED RINGERS IV SOLN
INTRAVENOUS | Status: DC | PRN
Start: 2021-10-25 — End: 2021-10-25

## 2021-10-25 MED ORDER — PHENYLEPHRINE HCL (PRESSORS) 10 MG/ML IV SOLN
INTRAVENOUS | Status: DC | PRN
  Administered 2021-10-25: 80 ug via INTRAVENOUS
  Administered 2021-10-25: 120 ug via INTRAVENOUS
  Administered 2021-10-25: 40 ug via INTRAVENOUS
  Administered 2021-10-25 (×2): 80 ug via INTRAVENOUS

## 2021-10-25 MED ORDER — FENTANYL CITRATE (PF) 100 MCG/2ML IJ SOLN
INTRAMUSCULAR | Status: AC
Start: 2021-10-25 — End: ?
  Filled 2021-10-25: qty 2

## 2021-10-25 MED ORDER — ACETAMINOPHEN 500 MG PO TABS
ORAL_TABLET | ORAL | Status: AC
  Filled 2021-10-25: qty 2

## 2021-10-25 MED ORDER — ACETAMINOPHEN 500 MG PO TABS
1000.0000 mg | ORAL_TABLET | Freq: Once | ORAL | Status: AC
  Administered 2021-10-25: 1000 mg via ORAL

## 2021-10-25 MED ORDER — ACETAMINOPHEN 500 MG PO TABS: 1000.0000 mg | ORAL_TABLET | Freq: Once | ORAL | Status: DC

## 2021-10-25 MED ORDER — PROPOFOL 500 MG/50ML IV EMUL
INTRAVENOUS | Status: DC | PRN
Start: 2021-10-25 — End: 2021-10-25
  Administered 2021-10-25: 200 ug/kg/min via INTRAVENOUS

## 2021-10-25 MED ORDER — PROPOFOL 10 MG/ML IV BOLUS
INTRAVENOUS | Status: AC
Start: 2021-10-25 — End: ?
  Filled 2021-10-25: qty 20

## 2021-10-25 MED ORDER — HYDROMORPHONE HCL 1 MG/ML IJ SOLN
0.5000 mg | Freq: Once | INTRAMUSCULAR | Status: AC
  Administered 2021-10-25: 0.5 mg via INTRAVENOUS
  Filled 2021-10-25: qty 1

## 2021-10-25 SURGICAL SUPPLY — 19 items
BNDG CONFORM 2 STRL LF (GAUZE/BANDAGES/DRESSINGS) IMPLANT
DILATOR CANAL MILEX (MISCELLANEOUS) IMPLANT
DRSG PAD ABDOMINAL 8X10 ST (GAUZE/BANDAGES/DRESSINGS) ×3 IMPLANT
DRSG TELFA 3X8 NADH (GAUZE/BANDAGES/DRESSINGS) ×3 IMPLANT
GAUZE 4X4 16PLY ~~LOC~~+RFID DBL (SPONGE) ×6 IMPLANT
GLOVE SURG ENC MOIS LTX SZ7.5 (GLOVE) ×6 IMPLANT
GOWN STRL REUS W/TWL LRG LVL3 (GOWN DISPOSABLE) ×3 IMPLANT
HOLDER FOLEY CATH W/STRAP (MISCELLANEOUS) ×3 IMPLANT
IV NS 1000ML (IV SOLUTION) ×3
IV NS 1000ML BAXH (IV SOLUTION) ×2 IMPLANT
IV SET EXTENSION GRAVITY 40 LF (IV SETS) ×3 IMPLANT
KIT TURNOVER CYSTO (KITS) ×3 IMPLANT
MAT PREVALON FULL STRYKER (MISCELLANEOUS) ×3 IMPLANT
PACK VAGINAL MINOR WOMEN LF (CUSTOM PROCEDURE TRAY) ×3 IMPLANT
PACKING VAGINAL (PACKING) IMPLANT
PAD DRESSING TELFA 3X8 NADH (GAUZE/BANDAGES/DRESSINGS) ×2 IMPLANT
TOWEL OR 17X26 10 PK STRL BLUE (TOWEL DISPOSABLE) ×3 IMPLANT
TRAY FOLEY W/BAG SLVR 14FR LF (SET/KITS/TRAYS/PACK) ×3 IMPLANT
WATER STERILE IRR 500ML POUR (IV SOLUTION) ×3 IMPLANT

## 2021-10-25 NOTE — Anesthesia Procedure Notes (Signed)
Procedure Name: LMA Insertion ?Date/Time: 10/25/2021 7:51 AM ?Performed by: Verita Lamb, CRNA ?Pre-anesthesia Checklist: Patient identified, Emergency Drugs available, Suction available and Patient being monitored ?Patient Re-evaluated:Patient Re-evaluated prior to induction ?Oxygen Delivery Method: Circle system utilized ?Preoxygenation: Pre-oxygenation with 100% oxygen ?Induction Type: IV induction ?Ventilation: Mask ventilation without difficulty ?LMA: LMA inserted ?LMA Size: 4.0 ?Number of attempts: 1 ?Airway Equipment and Method: Bite block ?Placement Confirmation: positive ETCO2 ?Tube secured with: Tape ?Dental Injury: Teeth and Oropharynx as per pre-operative assessment  ? ? ? ? ?

## 2021-10-25 NOTE — Transfer of Care (Signed)
Immediate Anesthesia Transfer of Care Note ? ?Patient: Taylor Buchanan ? ?Procedure(s) Performed: TANDEM RING INSERTION (Uterus) ?OPERATIVE ULTRASOUND (Uterus) ?EXAM UNDER ANESTHESIA (Vagina ) ? ?Patient Location: PACU ? ?Anesthesia Type:General ? ?Level of Consciousness: awake, alert  and oriented ? ?Airway & Oxygen Therapy: Patient Spontanous Breathing and Patient connected to face mask oxygen ? ?Post-op Assessment: Report given to RN and Post -op Vital signs reviewed and stable ? ?Post vital signs: Reviewed and stable ? ?Last Vitals:  ?Vitals Value Taken Time  ?BP 109/67 10/25/21 0827  ?Temp 36.4 ?C 10/25/21 0827  ?Pulse 80 10/25/21 0829  ?Resp 21 10/25/21 0829  ?SpO2 98 % 10/25/21 0829  ?Vitals shown include unvalidated device data. ? ?Last Pain:  ?Vitals:  ? 10/25/21 0603  ?TempSrc: Oral  ?PainSc: 0-No pain  ?   ? ?Patients Stated Pain Goal: 5 (10/25/21 0603) ? ?Complications: No notable events documented. ?

## 2021-10-25 NOTE — Anesthesia Postprocedure Evaluation (Signed)
Anesthesia Post Note ? ?Patient: Taylor Buchanan ? ?Procedure(s) Performed: TANDEM RING INSERTION (Uterus) ?OPERATIVE ULTRASOUND (Uterus) ?EXAM UNDER ANESTHESIA (Vagina ) ? ?  ? ?Patient location during evaluation: PACU ?Anesthesia Type: General ?Level of consciousness: awake and alert ?Pain management: pain level controlled ?Vital Signs Assessment: post-procedure vital signs reviewed and stable ?Respiratory status: spontaneous breathing, nonlabored ventilation and respiratory function stable ?Cardiovascular status: blood pressure returned to baseline and stable ?Postop Assessment: no apparent nausea or vomiting ?Anesthetic complications: no ? ? ?No notable events documented. ? ?Last Vitals:  ?Vitals:  ? 10/25/21 0845 10/25/21 0900  ?BP: 114/68 114/65  ?Pulse: 75 71  ?Resp: 20 20  ?Temp:  (!) 36.4 ?C  ?SpO2: 95% 94%  ?  ?Last Pain:  ?Vitals:  ? 10/25/21 0900  ?TempSrc:   ?PainSc: 0-No pain  ? ? ?  ?  ?  ?  ?  ?  ? ?[name],W. EDMOND ? ? ? ? ?

## 2021-10-25 NOTE — Interval H&P Note (Signed)
History and Physical Interval Note: ? ?10/25/2021 ?7:20 AM ? ?Taylor Buchanan  has presented today for surgery, with the diagnosis of CERVICAL CANCER.  The various methods of treatment have been discussed with the patient and family. After consideration of risks, benefits and other options for treatment, the patient has consented to  Procedure(s): ?TANDEM RING INSERTION (N/A) ?OPERATIVE ULTRASOUND (N/A) as a surgical intervention.  The patient's history has been reviewed, patient examined, no change in status, stable for surgery.  I have reviewed the patient's chart and labs.  Questions were answered to the patient's satisfaction.   ? ? ?Gery Pray ? ? ?

## 2021-10-25 NOTE — Op Note (Signed)
10/25/2021 ? ?8:42 AM ? ?PATIENT:  Taylor Buchanan  66 y.o. female ? ?PRE-OPERATIVE DIAGNOSIS:  CERVICAL CANCER ? ?POST-OPERATIVE DIAGNOSIS:  CERVICAL CANCER ? ?PROCEDURE:  Procedure(s) with comments: ?TANDEM RING INSERTION (N/A) ?OPERATIVE ULTRASOUND (N/A) ?EXAM UNDER ANESTHESIA (N/A) - Drc Berline Lopes ? ?SURGEON:  Surgeon(s) and Role: ?   Gery Pray, MD - Primary ?   Lafonda Mosses, MD - Assisting ? ?PHYSICIAN ASSISTANT:  ? ?ASSISTANTS: none  ? ?ANESTHESIA:   general ? ?EBL:  5 mL  ? ?BLOOD ADMINISTERED:none ? ?DRAINS: Urinary Catheter (Foley)  ? ?LOCAL MEDICATIONS USED:  NONE ? ?SPECIMEN:  No Specimen ? ?DISPOSITION OF SPECIMEN:  N/A ? ?COUNTS:  YES ? ?TOURNIQUET:  * No tourniquets in log * ? ?DICTATION: The patient was prepped and draped in usual sterile fashion and placed in the dorsolithotomy position.  Timeout for the procedure preoperative medications and antibiotics was performed.  Dr. Jeral Pinch proceeded to undergo an exam under anesthesia.  Based on her examination, the patient is felt to be better served by definitive radiation therapy rather than proceeding with an extrafascial hysterectomy after her first brachytherapy procedure.  The patient proceeded to undergo exam under anesthesia.  There was erythema throughout the cervical region.  On bimanual and rectovaginal examination the cervix was firm but no extent into the parametrial tissues or vaginal region.  The cervix was estimated to be approximately 2.75 cm x 3 cm. she then proceeded to undergo sounding of the uterus.  Initially good images were obtained on transabdominal ultrasound.  The uterus was anteverted.  Uterus sounded to approximately 5-1/2 cm.  She proceeded to undergo dilation of the cervical os.  Initially a 60 mm tandem was placed but this was too long based on patient's anatomy.  She then had placement of a 40 mm tandem with a cervical sleeve in place.  Ultrasound images showed good placement of the tandem within the upper  three quarters of the uterus.  She then proceeded to have placement of a 45 degree cervical ring with a small shielding cap in place.  It was then followed by placement of a rectal paddle posteriorly to limit dose to the rectal mucosal.  She tolerated the procedure well.  Patient was subsequently taken to the recovery room in stable condition.  Later in the day the patient will be taken to radiation oncology for simulation, planning and her first high-dose-rate treatment.  She is to receive 5.5 Pearline Cables to the high risk clinical target volume.  Iridium 192 will be the high-dose-rate source. ? ?PLAN OF CARE:  Transfer to radiation oncology for planning and treatment ? ?PATIENT DISPOSITION:  PACU - hemodynamically stable. ?  ?Delay start of Pharmacological VTE agent (>24hrs) due to surgical blood loss or risk of bleeding: not applicable ? ?

## 2021-10-25 NOTE — Patient Instructions (Signed)
IMMEDIATELY FOLLOWING SURGERY: Do not drive or operate machinery for the first twenty four hours after surgery. Do not make any important decisions for twenty four hours after surgery or while taking narcotic pain medications or sedatives. If you develop intractable nausea and vomiting or a severe headache please notify your doctor immediately.  ? ?FOLLOW-UP: You do not need to follow up with anesthesia unless specifically instructed to do so.  ? ?WOUND CARE INSTRUCTIONS (if applicable): Expect some mild vaginal bleeding, but if large amount of bleeding occurs please contact Dr. Sondra Come at [phone] or the Radiation On-Call physician. Call for any fever greater than 101.0 degrees or increasing vaginal//abdominal pain or trouble urinating.  ? ?QUESTIONS?: Please feel free to call your physician or the hospital operator if you have any questions, and they will be happy to assist you. Resume all medications: as listed on your after visit summary. Your next appointment is: ? ?Future Appointments  ?Date Time Provider [place]  ?10/31/2021 10:30 AM WL-US 2 WL-US [place]  ?11/01/2021  2:00 PM Gery Pray, MD [facility] None  ?11/11/2021  8:30 AM WL-US 2 WL-US [place]  ?11/11/2021  2:00 PM Gery Pray, MD [facility] None  ?11/14/2021 10:30 AM WL-US 2 WL-US [place]  ?11/18/2021  7:30 AM WL-US 2 WL-US [place]  ?11/18/2021  2:00 PM Gery Pray, MD [facility] None  ?11/22/2021 10:30 AM WL-US 2 WL-US [place]  ?11/22/2021  3:00 PM Gery Pray, MD [facility] None  ?12/12/2021  9:45 AM [facility] None  ?12/12/2021 10:20 AM Heath Lark, MD [facility] None  ?01/25/2022 11:00 AM Leamon Arnt, MD [facility]  ? ? ?  ?

## 2021-10-26 ENCOUNTER — Encounter (HOSPITAL_BASED_OUTPATIENT_CLINIC_OR_DEPARTMENT_OTHER): Payer: Self-pay | Admitting: Radiation Oncology

## 2021-10-27 ENCOUNTER — Encounter: Payer: Self-pay | Admitting: Gynecologic Oncology

## 2021-10-27 NOTE — Progress Notes (Signed)
I called the patient to discuss questions that she had sent in a mychart message. I discussed findings from her recent exam under anesthesia at time of her first HDR treatment. We reviewed the typical course of treatment in the setting of early-stage cervix cancer, and the factors that go into deciding to proceed with primary surgery versus chemotherapy and radiation. I also discussed the data for interval hysterectomy in the setting of  larger tumors where there is concern that the size of the tumor or patient?s anatomy will preclude adequate treatment with primary radiation. This data does not show improved oncologic outcomes, but it may lead to decreased risk of local recurrence. I discussed the increased risk related to surgery after radiation. Given the size of her initial tumor and her response to treatment, my recommendation is to proceed with recommended primar y chemoradiation without interval hysterectomy. All of the patients questions were answered. She voiced being comfortable with the plan to move forward with completion of HDR. ? ?Dorian Pod MD

## 2021-10-31 ENCOUNTER — Ambulatory Visit (HOSPITAL_BASED_OUTPATIENT_CLINIC_OR_DEPARTMENT_OTHER): Admit: 2021-10-31 | Payer: Medicare Other | Admitting: Radiation Oncology

## 2021-10-31 ENCOUNTER — Encounter (HOSPITAL_BASED_OUTPATIENT_CLINIC_OR_DEPARTMENT_OTHER): Payer: Self-pay | Admitting: Radiation Oncology

## 2021-10-31 ENCOUNTER — Encounter (HOSPITAL_BASED_OUTPATIENT_CLINIC_OR_DEPARTMENT_OTHER): Payer: Self-pay

## 2021-10-31 ENCOUNTER — Other Ambulatory Visit: Payer: Self-pay

## 2021-10-31 ENCOUNTER — Ambulatory Visit (HOSPITAL_COMMUNITY)
Admission: RE | Admit: 2021-10-31 | Discharge: 2021-10-31 | Disposition: A | Discharge status: Home / Self Care | Payer: Medicare Other | Source: Ambulatory Visit | Attending: Radiation Oncology | Admitting: Radiation Oncology

## 2021-10-31 ENCOUNTER — Ambulatory Visit: Payer: Medicare Other | Admitting: Radiation Oncology

## 2021-10-31 SURGERY — INSERTION, UTERINE TANDEM AND RING OR CYLINDER, FOR BRACHYTHERAPY
Anesthesia: General

## 2021-10-31 NOTE — Progress Notes (Signed)
Spoke w/ via phone for pre-op interview---pt ?Lab needs dos----   none per anesthesia, left message with shirley at dr Sondra Come office requesting surgery orders             ?Lab results------echo 01-31-2021 epic, cbc with dif, bmp both done 10-17-2021 cancer center epic ?COVID test -----patient states asymptomatic no test needed ?Arrive at -------700 am 11-03-2021 ?NPO after MN NO Solid Food.  Clear liquids from MN until---600 am ?Med rec completed ?Medications to take morning of surgery -----lexapro, protonix, xanax prn, lomotil prn, zofran prn, compazine prn. ?Diabetic medication -----n/a ?Patient instructed no nail polish to be worn day of surgery ?Patient instructed to bring photo id and insurance card day of surgery ?Patient aware to have Driver (ride ) / caregiver   husband dean Crownover  for 24 hours after surgery  ?Patient Special Instructions -----none ?Pre-Op special Istructions -----none ?Patient verbalized understanding of instructions that were given at this phone interview. ?Patient denies shortness of breath, chest pain, fever, cough at this phone interview.  ?

## 2021-11-01 ENCOUNTER — Ambulatory Visit: Payer: Medicare Other | Admitting: Radiation Oncology

## 2021-11-02 NOTE — Progress Notes (Signed)
?  Radiation Oncology         [phone] ?________________________________ ? ?Name: Taylor Buchanan MRN: [id number]  ?Date: 11/03/2021  DOB: [birth year] ? ?CC: Leamon Arnt, MD  Lafonda Mosses, MD ? ?HDR BRACHYTHERAPY NOTE ? ?DIAGNOSIS: The encounter diagnosis was Malignant neoplasm of cervix, unspecified site [facility]). ?  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 ?NARRATIVE: ?The patient was brought to the [place] suite. Identity was confirmed. All relevant records and images related to the planned course of therapy were reviewed. The patient freely provided informed written consent to proceed with treatment after reviewing the details related to the planned course of therapy. The consent form was witnessed and verified by the simulation staff. Then, the patient was set-up in a stable reproducible supine position for radiation therapy. The tandem ring system was accessed and fiducial markers were placed within the tandem and ring.  ? ?Simple treatment device note: ?On the operating room the patient had construction of her custom tandem ring system. She will be treated with a 45? tandem/ring system. The patient had placement of a 40 mm tandem. A cervical ring with a small shielding was used for her treatment. A rectal paddle was also part of her custom set up device. ? ?Verification simulation note: ?An AP and lateral film was obtained through the pelvis area. This was compared to the patient's planning films documenting accurate position of the tandem/ring system for treatment. ? ?High-dose-rate brachytherapy treatment note:   ?The remote afterloading device was accessed through catheter system and attached to the tandem ring system. Patient then proceeded to undergo her second high-dose-rate treatment directed at the cervix. The patient was prescribed a dose of 5.5 gray to be delivered to the [place]. Patient was treated with 2 channels using 22 dwell positions. Treatment time was 339.5 seconds.  The patient tolerated the procedure well. After completion of her therapy, a radiation survey was performed documenting return of the iridium source into the [employer] safe. The patient was then transferred to the nursing suite.  She then had removal of the rectal paddle followed by the tandem and ring system. The patient tolerated the removal well. ? ?PLAN: The patient will return next week for her third high-dose-rate treatment. ?________________________________   ? ?----------------------------------- ? ?Blair Promise, PhD, MD ? ?This document serves as a record of services personally performed by Gery Pray, MD. It was created on his behalf by Roney Mans, a trained medical scribe. The creation of this record is based on the scribe's personal observations and the provider's statements to them. This document has been checked and approved by the attending provider. ? ? ?

## 2021-11-02 NOTE — Anesthesia Preprocedure Evaluation (Addendum)
Anesthesia Evaluation  ?Patient identified by MRN, date of birth, ID band ?Patient awake ? ? ? ?Reviewed: ?Allergy & Precautions, NPO status , Patient's Chart, lab work & pertinent test results ? ?Airway ?Mallampati: I ? ?TM Distance: >3 FB ?Neck ROM: Full ? ? ? Dental ?no notable dental hx. ?(+) Teeth Intact, Dental Advisory Given ?  ?Pulmonary ?former smoker,  ?  ?Pulmonary exam normal ?breath sounds clear to auscultation ? ? ? ? ? ? Cardiovascular ?Exercise Tolerance: Good ?Normal cardiovascular exam ?Rhythm:Regular Rate:Normal ? ? ?  ?Neuro/Psych ?Anxiety   ? GI/Hepatic ?Neg liver ROS, GERD  ,  ?Endo/Other  ?negative endocrine ROS ? Renal/GU ?Lab Results ?     Component                Value               Date                 ?     CREATININE               0.85                10/17/2021           ?     BUN                      17                  10/17/2021           ?     NA                       136                 10/17/2021           ?     K                        3.1 (L)             10/17/2021           ?     CL                       100                 10/17/2021           ?     CO2                      27                  10/17/2021           ?  ?Female GU complaint (Cervical CA) ? ? ?  ?Musculoskeletal ? ? Abdominal ?  ?Peds ? Hematology ? ?(+) Blood dyscrasia, anemia , Lab Results ?     Component                Value               Date                 ?     WBC  2.3 (L)             10/17/2021           ?     HGB                      11.2 (L)            10/17/2021           ?     HCT                      31.4 (L)            10/17/2021           ?     MCV                      92.6                10/17/2021           ?     PLT                      109 (L)             10/17/2021           ?   ?Anesthesia Other Findings ? ? Reproductive/Obstetrics ?negative OB ROS ? ?  ? ? ? ? ? ? ? ? ? ? ? ? ? ?  ?  ? ? ? ? ? ? ? ?Anesthesia Physical ?Anesthesia  Plan ? ?ASA: 2 ? ?Anesthesia Plan: General  ? ?Post-op Pain Management: Tylenol PO (pre-op)*  ? ?Induction: Intravenous ? ?PONV Risk Score and Plan: 4 or greater and Treatment may vary due to age or medical condition, Midazolam, Dexamethasone and Ondansetron ? ?Airway Management Planned: LMA ? ?Additional Equipment: None ? ?Intra-op Plan:  ? ?Post-operative Plan:  ? ?Informed Consent: I have reviewed the patients History and Physical, chart, labs and discussed the procedure including the risks, benefits and alternatives for the proposed anesthesia with the patient or authorized representative who has indicated his/her understanding and acceptance.  ? ? ? ?Dental advisory given ? ?Plan Discussed with: CRNA and Anesthesiologist ? ?Anesthesia Plan Comments:   ? ? ? ? ? ?Anesthesia Quick Evaluation ? ?

## 2021-11-03 ENCOUNTER — Encounter (HOSPITAL_BASED_OUTPATIENT_CLINIC_OR_DEPARTMENT_OTHER): Payer: Self-pay | Admitting: Radiation Oncology

## 2021-11-03 ENCOUNTER — Ambulatory Visit (HOSPITAL_BASED_OUTPATIENT_CLINIC_OR_DEPARTMENT_OTHER)
Admission: RE | Admit: 2021-11-03 | Discharge: 2021-11-03 | Disposition: A | Discharge status: Home / Self Care | Payer: Medicare Other | Attending: Radiation Oncology | Admitting: Radiation Oncology

## 2021-11-03 ENCOUNTER — Telehealth: Payer: Self-pay | Admitting: Oncology

## 2021-11-03 ENCOUNTER — Other Ambulatory Visit: Payer: Self-pay | Admitting: Hematology and Oncology

## 2021-11-03 ENCOUNTER — Ambulatory Visit (HOSPITAL_COMMUNITY)
Admission: RE | Admit: 2021-11-03 | Discharge: 2021-11-03 | Disposition: A | Discharge status: Home / Self Care | Payer: Medicare Other | Source: Home / Self Care | Attending: Radiation Oncology | Admitting: Radiation Oncology

## 2021-11-03 ENCOUNTER — Ambulatory Visit
Admission: RE | Admit: 2021-11-03 | Discharge: 2021-11-03 | Disposition: A | Discharge status: Home / Self Care | Payer: Medicare Other | Source: Ambulatory Visit | Attending: Radiation Oncology | Admitting: Radiation Oncology

## 2021-11-03 ENCOUNTER — Ambulatory Visit (HOSPITAL_BASED_OUTPATIENT_CLINIC_OR_DEPARTMENT_OTHER): Payer: Medicare Other | Admitting: Certified Registered Nurse Anesthetist

## 2021-11-03 ENCOUNTER — Encounter (HOSPITAL_BASED_OUTPATIENT_CLINIC_OR_DEPARTMENT_OTHER)
Admission: RE | Disposition: A | Discharge status: Home / Self Care | Payer: Self-pay | Source: Home / Self Care | Attending: Radiation Oncology

## 2021-11-03 ENCOUNTER — Other Ambulatory Visit: Payer: Self-pay

## 2021-11-03 VITALS — BP 103/61 | HR 65 | Temp 96.8°F | Resp 20

## 2021-11-03 DIAGNOSIS — D539 Nutritional anemia, unspecified: Secondary | ICD-10-CM | POA: Insufficient documentation

## 2021-11-03 DIAGNOSIS — N950 Postmenopausal bleeding: Secondary | ICD-10-CM | POA: Diagnosis not present

## 2021-11-03 DIAGNOSIS — R197 Diarrhea, unspecified: Secondary | ICD-10-CM | POA: Insufficient documentation

## 2021-11-03 DIAGNOSIS — C539 Malignant neoplasm of cervix uteri, unspecified: Secondary | ICD-10-CM

## 2021-11-03 DIAGNOSIS — Z9221 Personal history of antineoplastic chemotherapy: Secondary | ICD-10-CM | POA: Diagnosis not present

## 2021-11-03 DIAGNOSIS — Z923 Personal history of irradiation: Secondary | ICD-10-CM | POA: Diagnosis not present

## 2021-11-03 HISTORY — PX: OPERATIVE ULTRASOUND: SHX5996

## 2021-11-03 HISTORY — PX: TANDEM RING INSERTION: SHX6199

## 2021-11-03 LAB — BASIC METABOLIC PANEL
Anion gap: 5 (ref 5–15)
BUN: 25 mg/dL — ABNORMAL HIGH (ref 8–23)
CO2: 25 mmol/L (ref 22–32)
Calcium: 8.4 mg/dL — ABNORMAL LOW (ref 8.9–10.3)
Chloride: 109 mmol/L (ref 98–111)
Creatinine, Ser: 0.86 mg/dL (ref 0.44–1.00)
GFR, Estimated: >60 mL/min (ref >60)
Glucose, Bld: 109 mg/dL — ABNORMAL HIGH (ref 70–99)
Potassium: 4 mmol/L (ref 3.5–5.1)
Sodium: 139 mmol/L (ref 135–145)

## 2021-11-03 LAB — CBC WITH DIFFERENTIAL/PLATELET
Abs Immature Granulocytes: 0.06 10*3/uL (ref 0.00–0.07)
Basophils Absolute: 0 10*3/uL (ref 0.0–0.1)
Basophils Relative: 0 %
Eosinophils Absolute: 0 10*3/uL (ref 0.0–0.5)
Eosinophils Relative: 0 %
HCT: 25.8 % — ABNORMAL LOW (ref 36.0–46.0)
Hemoglobin: 8.5 g/dL — ABNORMAL LOW (ref 12.0–15.0)
Immature Granulocytes: 2 %
Lymphocytes Relative: 12 %
Lymphs Abs: 0.4 10*3/uL — ABNORMAL LOW (ref 0.7–4.0)
MCH: 34.1 pg — ABNORMAL HIGH (ref 26.0–34.0)
MCHC: 32.9 g/dL (ref 30.0–36.0)
MCV: 103.6 fL — ABNORMAL HIGH (ref 80.0–100.0)
Monocytes Absolute: 0.5 10*3/uL (ref 0.1–1.0)
Monocytes Relative: 15 %
Neutro Abs: 2.2 10*3/uL (ref 1.7–7.7)
Neutrophils Relative %: 71 %
Platelets: 395 10*3/uL (ref 150–400)
RBC: 2.49 MIL/uL — ABNORMAL LOW (ref 3.87–5.11)
RDW: 16.5 % — ABNORMAL HIGH (ref 11.5–15.5)
WBC: 3.1 10*3/uL — ABNORMAL LOW (ref 4.0–10.5)
nRBC: 0.7 % — ABNORMAL HIGH (ref 0.0–0.2)

## 2021-11-03 SURGERY — INSERTION, UTERINE TANDEM AND RING OR CYLINDER, FOR BRACHYTHERAPY
Anesthesia: General | Site: Cervix

## 2021-11-03 MED ORDER — DEXAMETHASONE SODIUM PHOSPHATE 10 MG/ML IJ SOLN
INTRAMUSCULAR | Status: AC
  Filled 2021-11-03: qty 1

## 2021-11-03 MED ORDER — ONDANSETRON HCL 4 MG/2ML IJ SOLN
INTRAMUSCULAR | Status: AC
  Filled 2021-11-03: qty 2

## 2021-11-03 MED ORDER — LIDOCAINE HCL (PF) 2 % IJ SOLN
INTRAMUSCULAR | Status: AC
  Filled 2021-11-03: qty 5

## 2021-11-03 MED ORDER — LIDOCAINE 2% (20 MG/ML) 5 ML SYRINGE
INTRAMUSCULAR | Status: DC | PRN
  Administered 2021-11-03: 80 mg via INTRAVENOUS

## 2021-11-03 MED ORDER — SODIUM CHLORIDE 0.9% FLUSH
10.0000 mL | Freq: Once | INTRAVENOUS | Status: AC
  Administered 2021-11-03: 10 mL via INTRAVENOUS

## 2021-11-03 MED ORDER — ONDANSETRON HCL 4 MG/2ML IJ SOLN
INTRAMUSCULAR | Status: DC | PRN
  Administered 2021-11-03: 4 mg via INTRAVENOUS

## 2021-11-03 MED ORDER — EPHEDRINE 5 MG/ML INJ
INTRAVENOUS | Status: AC
  Filled 2021-11-03: qty 5

## 2021-11-03 MED ORDER — FENTANYL CITRATE (PF) 250 MCG/5ML IJ SOLN
INTRAMUSCULAR | Status: DC | PRN
Start: 2021-11-03 — End: 2021-11-03
  Administered 2021-11-03: 50 ug via INTRAVENOUS
  Administered 2021-11-03: 25 ug via INTRAVENOUS

## 2021-11-03 MED ORDER — SODIUM CHLORIDE 0.9% FLUSH: 10.0000 mL | INTRAVENOUS | Status: DC | PRN

## 2021-11-03 MED ORDER — PROPOFOL 10 MG/ML IV BOLUS
INTRAVENOUS | Status: AC
Start: 2021-11-03 — End: ?
  Filled 2021-11-03: qty 20

## 2021-11-03 MED ORDER — PROPOFOL 10 MG/ML IV BOLUS
INTRAVENOUS | Status: DC | PRN
  Administered 2021-11-03: 180 mg via INTRAVENOUS

## 2021-11-03 MED ORDER — HEPARIN SOD (PORK) LOCK FLUSH 100 UNIT/ML IV SOLN
500.0000 [IU] | Freq: Once | INTRAVENOUS | Status: AC
  Administered 2021-11-03: 500 [IU] via INTRAVENOUS

## 2021-11-03 MED ORDER — LACTATED RINGERS IV SOLN: INTRAVENOUS | Status: DC

## 2021-11-03 MED ORDER — DEXAMETHASONE SODIUM PHOSPHATE 10 MG/ML IJ SOLN
INTRAMUSCULAR | Status: DC | PRN
Start: 2021-11-03 — End: 2021-11-03
  Administered 2021-11-03: 10 mg via INTRAVENOUS

## 2021-11-03 MED ORDER — ONDANSETRON HCL 4 MG/2ML IJ SOLN: 4.0000 mg | Freq: Once | INTRAMUSCULAR | Status: DC | PRN

## 2021-11-03 MED ORDER — MIDAZOLAM HCL 2 MG/2ML IJ SOLN
INTRAMUSCULAR | Status: AC
  Filled 2021-11-03: qty 2

## 2021-11-03 MED ORDER — FENTANYL CITRATE (PF) 100 MCG/2ML IJ SOLN: 25.0000 ug | INTRAMUSCULAR | Status: DC | PRN

## 2021-11-03 MED ORDER — WHITE PETROLATUM EX OINT
TOPICAL_OINTMENT | CUTANEOUS | Status: AC
  Filled 2021-11-03: qty 5

## 2021-11-03 MED ORDER — EPHEDRINE SULFATE-NACL 50-0.9 MG/10ML-% IV SOSY
PREFILLED_SYRINGE | INTRAVENOUS | Status: DC | PRN
  Administered 2021-11-03 (×2): 5 mg via INTRAVENOUS

## 2021-11-03 MED ORDER — PHENYLEPHRINE 40 MCG/ML (10ML) SYRINGE FOR IV PUSH (FOR BLOOD PRESSURE SUPPORT)
PREFILLED_SYRINGE | INTRAVENOUS | Status: DC | PRN
  Administered 2021-11-03 (×3): 80 ug via INTRAVENOUS

## 2021-11-03 MED ORDER — CHLORHEXIDINE GLUCONATE CLOTH 2 % EX PADS: 6.0000 | MEDICATED_PAD | Freq: Every day | CUTANEOUS | Status: DC

## 2021-11-03 MED ORDER — PHENYLEPHRINE 40 MCG/ML (10ML) SYRINGE FOR IV PUSH (FOR BLOOD PRESSURE SUPPORT)
PREFILLED_SYRINGE | INTRAVENOUS | Status: AC
  Filled 2021-11-03: qty 10

## 2021-11-03 MED ORDER — LACTATED RINGERS IV SOLN
INTRAVENOUS | Status: DC
  Filled 2021-11-03 (×2): qty 250

## 2021-11-03 MED ORDER — MIDAZOLAM HCL 2 MG/2ML IJ SOLN
INTRAMUSCULAR | Status: DC | PRN
  Administered 2021-11-03: 2 mg via INTRAVENOUS

## 2021-11-03 MED ORDER — ACETAMINOPHEN 10 MG/ML IV SOLN: 1000.0000 mg | Freq: Once | INTRAVENOUS | Status: DC | PRN

## 2021-11-03 MED ORDER — SODIUM CHLORIDE 0.9 % IR SOLN
Status: DC | PRN
  Administered 2021-11-03: 200 mL

## 2021-11-03 MED ORDER — 0.9 % SODIUM CHLORIDE (POUR BTL) OPTIME
TOPICAL | Status: DC | PRN
  Administered 2021-11-03: 500 mL

## 2021-11-03 MED ORDER — FENTANYL CITRATE (PF) 100 MCG/2ML IJ SOLN
INTRAMUSCULAR | Status: AC
  Filled 2021-11-03: qty 2

## 2021-11-03 MED ORDER — HYDROMORPHONE HCL 1 MG/ML IJ SOLN
0.5000 mg | Freq: Once | INTRAMUSCULAR | Status: AC
  Administered 2021-11-03: 0.5 mg via INTRAVENOUS
  Filled 2021-11-03: qty 1

## 2021-11-03 SURGICAL SUPPLY — 20 items
BNDG CONFORM 2 STRL LF (GAUZE/BANDAGES/DRESSINGS) IMPLANT
DILATOR CANAL MILEX (MISCELLANEOUS) IMPLANT
DRSG PAD ABDOMINAL 8X10 ST (GAUZE/BANDAGES/DRESSINGS) ×3 IMPLANT
DRSG TELFA 3X8 NADH (GAUZE/BANDAGES/DRESSINGS) ×3 IMPLANT
GAUZE 4X4 16PLY ~~LOC~~+RFID DBL (SPONGE) ×6 IMPLANT
GLOVE SURG ENC MOIS LTX SZ7.5 (GLOVE) ×6 IMPLANT
GOWN STRL REUS W/TWL LRG LVL3 (GOWN DISPOSABLE) ×3 IMPLANT
HOLDER FOLEY CATH W/STRAP (MISCELLANEOUS) ×3 IMPLANT
IV NS 1000ML (IV SOLUTION) ×3
IV NS 1000ML BAXH (IV SOLUTION) ×2 IMPLANT
IV SET EXTENSION GRAVITY 40 LF (IV SETS) ×3 IMPLANT
KIT TURNOVER CYSTO (KITS) ×3 IMPLANT
MAT PREVALON FULL STRYKER (MISCELLANEOUS) ×3 IMPLANT
PACK VAGINAL MINOR WOMEN LF (CUSTOM PROCEDURE TRAY) ×3 IMPLANT
PACKING VAGINAL (PACKING) IMPLANT
PAD DRESSING TELFA 3X8 NADH (GAUZE/BANDAGES/DRESSINGS) ×2 IMPLANT
SURGILUBE 2OZ TUBE FLIPTOP (MISCELLANEOUS) ×1 IMPLANT
TOWEL OR 17X26 10 PK STRL BLUE (TOWEL DISPOSABLE) ×3 IMPLANT
TRAY FOLEY W/BAG SLVR 14FR LF (SET/KITS/TRAYS/PACK) ×3 IMPLANT
WATER STERILE IRR 500ML POUR (IV SOLUTION) ×3 IMPLANT

## 2021-11-03 NOTE — Anesthesia Postprocedure Evaluation (Signed)
Anesthesia Post Note ? ?Patient: Taylor Buchanan ? ?Procedure(s) Performed: TANDEM RING INSERTION (Cervix) ?OPERATIVE ULTRASOUND (Abdomen) ? ?  ? ?Patient location during evaluation: PACU ?Anesthesia Type: General ?Level of consciousness: awake and alert ?Pain management: pain level controlled ?Vital Signs Assessment: post-procedure vital signs reviewed and stable ?Respiratory status: spontaneous breathing, nonlabored ventilation, respiratory function stable and patient connected to nasal cannula oxygen ?Cardiovascular status: blood pressure returned to baseline and stable ?Postop Assessment: no apparent nausea or vomiting ?Anesthetic complications: no ? ? ?No notable events documented. ? ?Last Vitals:  ?Vitals:  ? 11/03/21 1027 11/03/21 1030  ?BP:    ?Pulse: 69 65  ?Resp: 14 19  ?Temp:    ?SpO2: 96% 98%  ?  ?Last Pain:  ?Vitals:  ? 11/03/21 1027  ?TempSrc:   ?PainSc: 2   ? ? ?  ?  ?  ?  ?  ?  ? ?[name] ? ? ? ? ?

## 2021-11-03 NOTE — Patient Instructions (Signed)
IMMEDIATELY FOLLOWING SURGERY: Do not drive or operate machinery for the first twenty four hours after surgery. Do not make any important decisions for twenty four hours after surgery or while taking narcotic pain medications or sedatives. If you develop intractable nausea and vomiting or a severe headache please notify your doctor immediately.  ? ?FOLLOW-UP: You do not need to follow up with anesthesia unless specifically instructed to do so.  ? ?WOUND CARE INSTRUCTIONS (if applicable): Expect some mild vaginal bleeding, but if large amount of bleeding occurs please contact Dr. Sondra Come at [phone] or the Radiation On-Call physician. Call for any fever greater than 101.0 degrees or increasing vaginal//abdominal pain or trouble urinating.  ? ?QUESTIONS?: Please feel free to call your physician or the hospital operator if you have any questions, and they will be happy to assist you. Resume all medications: as listed on your after visit summary. Your next appointment is: ? ?Future Appointments  ?Date Time Provider [place]  ?11/11/2021  8:30 AM WL-US 2 WL-US [place]  ?11/11/2021 11:00 AM Gery Pray, MD [facility] None  ?11/11/2021  2:00 PM Gery Pray, MD [facility] None  ?11/14/2021 10:30 AM WL-US 2 WL-US [place]  ?11/18/2021  7:30 AM WL-US 2 WL-US [place]  ?11/18/2021  9:30 AM Gery Pray, MD [facility] None  ?11/18/2021  2:00 PM Gery Pray, MD [facility] None  ?11/22/2021 10:30 AM WL-US 2 WL-US [place]  ?11/22/2021  1:30 PM Gery Pray, MD [facility] None  ?11/22/2021  3:00 PM Gery Pray, MD [facility] None  ?12/12/2021  9:45 AM [facility] None  ?12/12/2021 10:20 AM Heath Lark, MD [facility] None  ?01/25/2022 11:00 AM Leamon Arnt, MD [facility]  ? ? ? ? ?

## 2021-11-03 NOTE — Interval H&P Note (Signed)
History and Physical Interval Note: ? ?11/03/2021 ?9:05 AM ? ?[name]  has presented today for surgery, with the diagnosis of CERVICAL CANCER.  The various methods of treatment have been discussed with the patient and family. After consideration of risks, benefits and other options for treatment, the patient has consented to  Procedure(s): ?TANDEM RING INSERTION (N/A) ?OPERATIVE ULTRASOUND (N/A) as a surgical intervention.  The patient's history has been reviewed, patient examined, no change in status, stable for surgery.  I have reviewed the patient's chart and labs.  Questions were answered to the patient's satisfaction.   ? ? ?Gery Pray ? ? ?

## 2021-11-03 NOTE — Anesthesia Procedure Notes (Signed)
Procedure Name: LMA Insertion ?Date/Time: 11/03/2021 9:16 AM ?Performed by: Clearnce Sorrel, CRNA ?Pre-anesthesia Checklist: Patient identified, Emergency Drugs available, Suction available and Patient being monitored ?Patient Re-evaluated:Patient Re-evaluated prior to induction ?Oxygen Delivery Method: Circle System Utilized ?Preoxygenation: Pre-oxygenation with 100% oxygen ?Induction Type: IV induction ?Ventilation: Mask ventilation without difficulty ?LMA: LMA inserted ?LMA Size: 4.0 ?Number of attempts: 1 ?Airway Equipment and Method: Bite block ?Placement Confirmation: positive ETCO2 ?Tube secured with: Tape ?Dental Injury: Teeth and Oropharynx as per pre-operative assessment  ? ? ? ? ?

## 2021-11-03 NOTE — Op Note (Signed)
11/03/2021 ? ?10:03 AM ? ?PATIENT:  Taylor Buchanan  66 y.o. female ? ?PRE-OPERATIVE DIAGNOSIS:  CERVICAL CANCER ? ?POST-OPERATIVE DIAGNOSIS:  CERVICAL CANCER ? ?PROCEDURE:  Procedure(s): ?TANDEM RING INSERTION (N/A) ?OPERATIVE ULTRASOUND (N/A) ? ?SURGEON:  Surgeon(s) and Role: ?   Gery Pray, MD - Primary ? ?PHYSICIAN ASSISTANT:  ? ?ASSISTANTS: none  ? ?ANESTHESIA:   general ? ?EBL:  0 mL  ? ?BLOOD ADMINISTERED:none ? ?DRAINS: Urinary Catheter (Foley)  ? ?LOCAL MEDICATIONS USED:  NONE ? ?SPECIMEN:  No Specimen ? ?DISPOSITION OF SPECIMEN:  N/A ? ?COUNTS:  YES ? ?TOURNIQUET:  * No tourniquets in log * ? ?DICTATION: .The patient was prepped and draped in usual sterile fashion and placed in the dorsolithotomy position.  Timeout for the procedure preoperative medications and antibiotics was performed.  The bladder was backfilled with approximately 200 cc of sterile water for ultrasound imaging purposes. the patient proceeded to undergo exam under anesthesia.  There was erythema throughout the cervical region.  On bimanual and rectovaginal examination the cervix was firm but no extent into the parametrial tissues or vaginal region.  The cervix was estimated to be approximately 2.75 cm x 3 cm.  The cervical sleeve placed last week remained in place.  This was a 40 mm sleeve.  Adequate images on transabdominal ultrasound were adequate but not ideal in light of bowel gas.   The uterus was anteverted.  The tandem was placed within the cervical sleeve.  ultrasound images showed good placement of the 45 degree , 40 mm tandem within the upper three quarters of the uterus.  She then proceeded to have placement of a 45 degree cervical ring with a small shielding cap in place.  It was then followed by placement of a rectal paddle posteriorly to limit dose to the rectal mucosal.  She tolerated the procedure well.  Patient was subsequently taken to the recovery room in stable condition.  Later in the day the patient will be taken  to radiation oncology for simulation, planning and her second high-dose-rate treatment.  She is to receive 5.5 Pearline Cables to the high risk clinical target volume.  Iridium 192 will be the high-dose-rate source. ? ?PLAN OF CARE:  Transfer to  radiation oncology for planning and treatment ? ?PATIENT DISPOSITION:  PACU - hemodynamically stable. ?  ?Delay start of Pharmacological VTE agent (>24hrs) due to surgical blood loss or risk of bleeding: not applicable ? ?

## 2021-11-03 NOTE — Telephone Encounter (Signed)
Taylor Buchanan and scheduled lab appointment for tomorrow at 9:15 for iron studies and to check B12 levels.   ?

## 2021-11-03 NOTE — Transfer of Care (Signed)
Immediate Anesthesia Transfer of Care Note ? ?Patient: Taylor Buchanan ? ?Procedure(s) Performed: TANDEM RING INSERTION (Cervix) ?OPERATIVE ULTRASOUND (Abdomen) ? ?Patient Location: PACU ? ?Anesthesia Type:General ? ?Level of Consciousness: awake, alert  and oriented ? ?Airway & Oxygen Therapy: Patient Spontanous Breathing ? ?Post-op Assessment: Report given to RN and Post -op Vital signs reviewed and stable ? ?Post vital signs: Reviewed and stable ? ?Last Vitals:  ?Vitals Value Taken Time  ?BP 100/55 11/03/21 0945  ?Temp 36.4 ?C 11/03/21 0945  ?Pulse 77 11/03/21 0946  ?Resp 17 11/03/21 0946  ?SpO2 94 % 11/03/21 0946  ?Vitals shown include unvalidated device data. ? ?Last Pain:  ?Vitals:  ? 11/03/21 0720  ?TempSrc: Oral  ?PainSc: 0-No pain  ?   ? ?Patients Stated Pain Goal: 5 (11/03/21 0720) ? ?Complications: No notable events documented. ?

## 2021-11-04 ENCOUNTER — Encounter (HOSPITAL_BASED_OUTPATIENT_CLINIC_OR_DEPARTMENT_OTHER): Payer: Self-pay | Admitting: Radiation Oncology

## 2021-11-04 ENCOUNTER — Inpatient Hospital Stay: Payer: Medicare Other | Attending: Gynecologic Oncology

## 2021-11-04 ENCOUNTER — Telehealth: Payer: Self-pay | Admitting: Oncology

## 2021-11-04 ENCOUNTER — Other Ambulatory Visit: Payer: Self-pay

## 2021-11-04 DIAGNOSIS — Z7982 Long term (current) use of aspirin: Secondary | ICD-10-CM | POA: Diagnosis not present

## 2021-11-04 DIAGNOSIS — K219 Gastro-esophageal reflux disease without esophagitis: Secondary | ICD-10-CM | POA: Diagnosis not present

## 2021-11-04 DIAGNOSIS — Z79899 Other long term (current) drug therapy: Secondary | ICD-10-CM | POA: Diagnosis not present

## 2021-11-04 DIAGNOSIS — F32A Depression, unspecified: Secondary | ICD-10-CM | POA: Diagnosis not present

## 2021-11-04 DIAGNOSIS — D539 Nutritional anemia, unspecified: Secondary | ICD-10-CM

## 2021-11-04 DIAGNOSIS — Z780 Asymptomatic menopausal state: Secondary | ICD-10-CM | POA: Insufficient documentation

## 2021-11-04 DIAGNOSIS — R102 Pelvic and perineal pain: Secondary | ICD-10-CM | POA: Diagnosis not present

## 2021-11-04 DIAGNOSIS — M419 Scoliosis, unspecified: Secondary | ICD-10-CM | POA: Insufficient documentation

## 2021-11-04 DIAGNOSIS — Z8673 Personal history of transient ischemic attack (TIA), and cerebral infarction without residual deficits: Secondary | ICD-10-CM | POA: Diagnosis not present

## 2021-11-04 DIAGNOSIS — M1990 Unspecified osteoarthritis, unspecified site: Secondary | ICD-10-CM | POA: Insufficient documentation

## 2021-11-04 DIAGNOSIS — E785 Hyperlipidemia, unspecified: Secondary | ICD-10-CM | POA: Insufficient documentation

## 2021-11-04 DIAGNOSIS — C539 Malignant neoplasm of cervix uteri, unspecified: Secondary | ICD-10-CM | POA: Diagnosis present

## 2021-11-04 DIAGNOSIS — M810 Age-related osteoporosis without current pathological fracture: Secondary | ICD-10-CM | POA: Insufficient documentation

## 2021-11-04 DIAGNOSIS — F419 Anxiety disorder, unspecified: Secondary | ICD-10-CM | POA: Diagnosis not present

## 2021-11-04 DIAGNOSIS — R7989 Other specified abnormal findings of blood chemistry: Secondary | ICD-10-CM

## 2021-11-04 LAB — IRON AND IRON BINDING CAPACITY (CC-WL,HP ONLY)
Iron: 108 ug/dL (ref 28–170)
Saturation Ratios: 35 % — ABNORMAL HIGH (ref 10.4–31.8)
TIBC: 307 ug/dL (ref 250–450)
UIBC: 199 ug/dL (ref 148–442)

## 2021-11-04 LAB — RETICULOCYTES
Immature Retic Fract: 38.7 % — ABNORMAL HIGH (ref 2.3–15.9)
RBC.: 2.57 MIL/uL — ABNORMAL LOW (ref 3.87–5.11)
Retic Count, Absolute: 111.3 10*3/uL (ref 19.0–186.0)
Retic Ct Pct: 4.3 % — ABNORMAL HIGH (ref 0.4–3.1)

## 2021-11-04 LAB — BASIC METABOLIC PANEL - CANCER CENTER ONLY
Anion gap: 7 (ref 5–15)
BUN: 18 mg/dL (ref 8–23)
CO2: 25 mmol/L (ref 22–32)
Calcium: 9 mg/dL (ref 8.9–10.3)
Chloride: 106 mmol/L (ref 98–111)
Creatinine: 0.76 mg/dL (ref 0.44–1.00)
GFR, Estimated: >60 mL/min (ref >60)
Glucose, Bld: 91 mg/dL (ref 70–99)
Potassium: 3.5 mmol/L (ref 3.5–5.1)
Sodium: 138 mmol/L (ref 135–145)

## 2021-11-04 LAB — CBC WITH DIFFERENTIAL (CANCER CENTER ONLY)
Abs Immature Granulocytes: 0.09 10*3/uL — ABNORMAL HIGH (ref 0.00–0.07)
Basophils Absolute: 0 10*3/uL (ref 0.0–0.1)
Basophils Relative: 0 %
Eosinophils Absolute: 0 10*3/uL (ref 0.0–0.5)
Eosinophils Relative: 0 %
HCT: 25.7 % — ABNORMAL LOW (ref 36.0–46.0)
Hemoglobin: 8.6 g/dL — ABNORMAL LOW (ref 12.0–15.0)
Immature Granulocytes: 2 %
Lymphocytes Relative: 13 %
Lymphs Abs: 0.7 10*3/uL (ref 0.7–4.0)
MCH: 33.5 pg (ref 26.0–34.0)
MCHC: 33.5 g/dL (ref 30.0–36.0)
MCV: 100 fL (ref 80.0–100.0)
Monocytes Absolute: 0.8 10*3/uL (ref 0.1–1.0)
Monocytes Relative: 13 %
Neutro Abs: 4 10*3/uL (ref 1.7–7.7)
Neutrophils Relative %: 72 %
Platelet Count: 416 10*3/uL — ABNORMAL HIGH (ref 150–400)
RBC: 2.57 MIL/uL — ABNORMAL LOW (ref 3.87–5.11)
RDW: 16.7 % — ABNORMAL HIGH (ref 11.5–15.5)
WBC Count: 5.6 10*3/uL (ref 4.0–10.5)
nRBC: 0.4 % — ABNORMAL HIGH (ref 0.0–0.2)

## 2021-11-04 LAB — TSH: TSH: 1.866 u[IU]/mL (ref 0.308–3.960)

## 2021-11-04 LAB — MAGNESIUM: Magnesium: 1.6 mg/dL — ABNORMAL LOW (ref 1.7–2.4)

## 2021-11-04 LAB — VITAMIN B12: Vitamin B-12: 214 pg/mL (ref 180–914)

## 2021-11-04 LAB — FERRITIN: Ferritin: 323 ng/mL — ABNORMAL HIGH (ref 11–307)

## 2021-11-04 LAB — SEDIMENTATION RATE: Sed Rate: 40 mm/hr — ABNORMAL HIGH (ref 0–22)

## 2021-11-04 NOTE — Telephone Encounter (Signed)
Called Taylor Buchanan and left a message advising that Dr. Alvy Bimler has reviewed her lab results from today and they show anemia of chronic disease from treatment.  She does not have any recommendations/treatments to make it go up.  Encouraged her to call back with any questions. ?

## 2021-11-05 DIAGNOSIS — S83519A Sprain of anterior cruciate ligament of unspecified knee, initial encounter: Secondary | ICD-10-CM

## 2021-11-05 HISTORY — DX: Sprain of anterior cruciate ligament of unspecified knee, initial encounter: S83.519A

## 2021-11-06 ENCOUNTER — Other Ambulatory Visit: Payer: Self-pay

## 2021-11-06 ENCOUNTER — Emergency Department (HOSPITAL_BASED_OUTPATIENT_CLINIC_OR_DEPARTMENT_OTHER)
Admission: EM | Admit: 2021-11-06 | Discharge: 2021-11-06 | Disposition: A | Discharge status: Home / Self Care | Payer: Medicare Other | Attending: Emergency Medicine | Admitting: Emergency Medicine

## 2021-11-06 ENCOUNTER — Emergency Department (HOSPITAL_BASED_OUTPATIENT_CLINIC_OR_DEPARTMENT_OTHER): Payer: Medicare Other

## 2021-11-06 ENCOUNTER — Encounter (HOSPITAL_BASED_OUTPATIENT_CLINIC_OR_DEPARTMENT_OTHER): Payer: Self-pay | Admitting: Emergency Medicine

## 2021-11-06 DIAGNOSIS — X58XXXA Exposure to other specified factors, initial encounter: Secondary | ICD-10-CM | POA: Diagnosis not present

## 2021-11-06 DIAGNOSIS — Z8541 Personal history of malignant neoplasm of cervix uteri: Secondary | ICD-10-CM | POA: Insufficient documentation

## 2021-11-06 DIAGNOSIS — M25532 Pain in left wrist: Secondary | ICD-10-CM | POA: Diagnosis not present

## 2021-11-06 DIAGNOSIS — S6992XA Unspecified injury of left wrist, hand and finger(s), initial encounter: Secondary | ICD-10-CM | POA: Diagnosis present

## 2021-11-06 DIAGNOSIS — Z7982 Long term (current) use of aspirin: Secondary | ICD-10-CM | POA: Diagnosis not present

## 2021-11-06 DIAGNOSIS — M25332 Other instability, left wrist: Secondary | ICD-10-CM

## 2021-11-06 DIAGNOSIS — S63392A Traumatic rupture of other ligament of left wrist, initial encounter: Secondary | ICD-10-CM | POA: Diagnosis not present

## 2021-11-06 MED ORDER — KETOROLAC TROMETHAMINE 30 MG/ML IJ SOLN
30.0000 mg | Freq: Once | INTRAMUSCULAR | Status: AC
Start: 2021-11-06 — End: 2021-11-06
  Administered 2021-11-06: 30 mg via INTRAMUSCULAR
  Filled 2021-11-06: qty 1

## 2021-11-06 MED ORDER — IBUPROFEN 800 MG PO TABS: 800.0000 mg | ORAL_TABLET | Freq: Three times a day (TID) | ORAL | 0 refills | Status: DC

## 2021-11-06 MED ORDER — HYDROCODONE-ACETAMINOPHEN 5-325 MG PO TABS
1.0000 | ORAL_TABLET | Freq: Once | ORAL | Status: AC
  Administered 2021-11-06: 1 via ORAL
  Filled 2021-11-06: qty 1

## 2021-11-06 NOTE — Discharge Instructions (Addendum)
It was a pleasure caring for you today in the emergency department. ? ?Take motrin with FOOD ? ?Please return to the emergency department for any worsening or worrisome symptoms. ? ?

## 2021-11-06 NOTE — ED Triage Notes (Signed)
Patient arrives ambulatory POV c/o left wrist pain/soreness and swelling when waking up yesterday morning. Denies any injury.  ?

## 2021-11-06 NOTE — ED Provider Notes (Signed)
?[facility] EMERGENCY DEPT ?Provider Note ? ? ?CSN: [id number] ?Arrival date & time: 11/06/21  1029 ? ?  ? ?History ? ?Chief Complaint  ?Patient presents with  ? Wrist Pain  ? ? ?Taylor Buchanan is a 66 y.o. female. ? ?This is a 66 y.o. female with significant medical history as below, including porosis, GAD, GERD, TIA, HLD who presents to the ED with complaint of left wrist pain.  Patient woke up this morning with discomfort to her left wrist.  She is right-hand dominant.  Denies falls or recent injuries.  Does feel she might of slept on the wrist but is unsure.  No fevers, chills, nausea or vomiting.  No recent medication or diet changes.  She took home Percocet which did mildly improve her symptoms.  No numbness or tingling.  No IV drug use.  Symptoms returned to she reports to the ER for evaluation ? ? ?Past Medical History: ?No date: Abnormal Pap smear of cervix ?No date: Arthritis ?No date: Cervical cancer ([place]) ?No date: Depression ?No date: GAD (generalized anxiety disorder) ?No date: GERD (gastroesophageal reflux disease) ?No date: History of migraine ?01/2021: History of transient ischemic attack (TIA) ?    Comment:  neruologist--- dr Leonie Man;  [facility]/ 2022 in [facility] ,   ?             per note complicated migraine vs anxiety,  less likely  ?             TIA ?No date: HLD (hyperlipidemia) ?No date: Osteoporosis ?No date: Pain disorder ?No date: Scoliosis ? ?Past Surgical History: ?08/04/2021: CERVICAL CONIZATION W/BX; N/A ?    Comment:  Procedure: CONIZATION CERVIX WITH BIOPSY: DILATION AND  ?             CURETTAGE OF UTERUS;  Surgeon: Sherlyn Hay, DO; ?             Location: [facility];  Service:  ?             Gynecology;  Laterality: N/A; ?2005: CHOLECYSTECTOMY OPEN ?    Comment:  approx ?09/08/2021: IR IMAGING GUIDED PORT INSERTION ?10/25/2021: OPERATIVE ULTRASOUND; N/A ?    Comment:  Procedure: OPERATIVE ULTRASOUND;  Surgeon: Sondra Come,  ?             Jeneen Rinks, MD;   Location: [facility];   ?             Service: Urology;  Laterality: N/A; ?11/03/2021: OPERATIVE ULTRASOUND; N/A ?    Comment:  Procedure: OPERATIVE ULTRASOUND;  Surgeon: Sondra Come,  ?             Jeneen Rinks, MD;  Location: [facility];   ?             Service: Urology;  Laterality: N/A; ?10/25/2021: TANDEM RING INSERTION; N/A ?    Comment:  Procedure: TANDEM RING INSERTION;  Surgeon: Sondra Come,  ?             Jeneen Rinks, MD;  Location: [facility];   ?             Service: Urology;  Laterality: N/A; ?11/03/2021: TANDEM RING INSERTION; N/A ?    Comment:  Procedure: TANDEM RING INSERTION;  Surgeon: Sondra Come,  ?             Jeneen Rinks, MD;  Location: [facility];   ?  Service: Urology;  Laterality: N/A; ?2008: TENDON REPAIR; Left ?    Comment:  Ankle/ foot  ? ? ?The history is provided by the patient and the spouse. No language interpreter was used.  ?Wrist Pain ?Pertinent negatives include no chest pain, no abdominal pain, no headaches and no shortness of breath.  ? ?  ? ?Home Medications ?Prior to Admission medications   ?Medication Sig Start Date End Date Taking? Authorizing Provider  ?ibuprofen (ADVIL) 800 MG tablet Take 1 tablet (800 mg total) by mouth 3 (three) times daily. 11/06/21  Yes Wynona Dove A, DO  ?acetaminophen (TYLENOL) 500 MG tablet Take 500 mg by mouth as needed.    [provider]  ?alendronate (FOSAMAX) 70 MG tablet Take 1 tablet (70 mg total) by mouth once a week. Take with a full glass of water on an empty stomach and sit upright for 30 minutes. ?Patient taking differently: Take 70 mg by mouth every Sunday. Take with a full glass of water on an empty stomach and sit upright for 30 minutes. ?Sunday's 05/31/21   Leamon Arnt, MD  ?ALPRAZolam Duanne Moron) 0.5 MG tablet Take 0.5-1 tablets (0.25-0.5 mg total) by mouth daily as needed for anxiety (panic symptoms). 08/09/21   Leamon Arnt, MD  ?aspirin EC 81 MG EC tablet Take 1 tablet (81 mg total)  by mouth daily. Swallow whole. 02/01/21   Wouk, Ailene Rud, MD  ?atorvastatin (LIPITOR) 40 MG tablet Take 1 tablet (40 mg total) by mouth at bedtime. 09/22/21   Leamon Arnt, MD  ?Calcium Carbonate (CALCIUM-CARB 600 PO) Take 1 tablet by mouth daily.    [provider]  ?cholecalciferol (VITAMIN D) 1000 UNITS tablet Take 1,000 Units by mouth daily.    [provider]  ?dexamethasone (DECADRON) 4 MG tablet Take 1 tablet (4 mg total) by mouth daily. 09/26/21   Heath Lark, MD  ?diphenoxylate-atropine (LOMOTIL) 2.5-0.025 MG tablet Take 1-2 tablets by mouth 4 (four) times daily as needed for diarrhea or loose stools. 10/03/21   Eppie Gibson, MD  ?escitalopram (LEXAPRO) 10 MG tablet Take 1 tablet (10 mg total) by mouth daily. 09/22/21   Leamon Arnt, MD  ?lidocaine-prilocaine (EMLA) cream Apply to affected area once ?Patient not taking: Reported on 10/25/2021 09/01/21   Heath Lark, MD  ?ondansetron (ZOFRAN) 8 MG tablet Take 1 tablet by mouth every 8  hours as needed. 09/01/21   Heath Lark, MD  ?oxyCODONE-acetaminophen (PERCOCET/ROXICET) 5-325 MG tablet Take 1 tablet by mouth every 6 hours as needed for severe pain. 10/25/21   Gery Pray, MD  ?pantoprazole (PROTONIX) 20 MG tablet Take 1 tablet (20 mg total) by mouth daily. 09/22/21   Leamon Arnt, MD  ?prochlorperazine (COMPAZINE) 10 MG tablet Take 1 tablet  by mouth every 6 hours as needed (Nausea or vomiting). 09/01/21   Heath Lark, MD  ?   ? ?Allergies    ?Patient has no known allergies.   ? ?Review of Systems   ?Review of Systems  ?Constitutional:  Negative for chills and fever.  ?HENT:  Negative for facial swelling and trouble swallowing.   ?Eyes:  Negative for photophobia and visual disturbance.  ?Respiratory:  Negative for cough and shortness of breath.   ?Cardiovascular:  Negative for chest pain and palpitations.  ?Gastrointestinal:  Negative for abdominal pain, nausea and vomiting.  ?Endocrine: Negative for polydipsia and polyuria.   ?Genitourinary:  Negative for difficulty urinating and hematuria.  ?Musculoskeletal:  Positive for arthralgias. Negative for gait  problem and joint swelling.  ?Skin:  Negative for pallor and rash.  ?Neurological:  Negative for syncope and headaches.  ?Psychiatric/Behavioral:  Negative for agitation and confusion.   ? ?Physical Exam ?Updated Vital Signs ?BP 104/74 (BP Location: Right Arm)   Pulse 92   Temp 98.6 ?F (37 ?C) (Oral)   Resp 17   Ht 5' 6.5" (1.689 m)   Wt 73 kg   LMP 08/21/2002   SpO2 98%   BMI 25.60 kg/m?  ?Physical Exam ?Vitals and nursing note reviewed.  ?Constitutional:   ?   General: She is not in acute distress. ?   Appearance: Normal appearance.  ?HENT:  ?   Head: Normocephalic and atraumatic.  ?   Right Ear: External ear normal.  ?   Left Ear: External ear normal.  ?   Nose: Nose normal.  ?   Mouth/Throat:  ?   Mouth: Mucous membranes are moist.  ?Eyes:  ?   General: No scleral icterus.    ?   Right eye: No discharge.     ?   Left eye: No discharge.  ?Cardiovascular:  ?   Rate and Rhythm: Normal rate and regular rhythm.  ?   Pulses: Normal pulses.  ?   Heart sounds: Normal heart sounds.  ?Pulmonary:  ?   Effort: Pulmonary effort is normal. No respiratory distress.  ?   Breath sounds: Normal breath sounds.  ?Abdominal:  ?   General: Abdomen is flat.  ?   Tenderness: There is no abdominal tenderness.  ?Musculoskeletal:     ?   General: Normal range of motion.  ?     Arms: ? ?   Cervical back: Normal range of motion.  ?   Right lower leg: No edema.  ?   Left lower leg: No edema.  ?   Comments: TTP to dorsum of wrist.  Mild swelling.  ? ?Capillary refill is brisk.  Radial pulses equal and symmetric.  No overlying cellulitis.  No wound or drainage.  Crepitus, no purulence.  No induration.  ? ?Neurovascular intact to radial, ulnar and median nerve distributions.  ROM limited secondary to discomfort  ?Skin: ?   General: Skin is warm and dry.  ?   Capillary Refill: Capillary refill takes less than  2 seconds.  ?Neurological:  ?   Mental Status: She is alert.  ?Psychiatric:     ?   Mood and Affect: Mood normal.     ?   Behavior: Behavior normal.  ? ? ?ED Results / Procedures / Treatments   ?Labs ?(all labs ordered are list

## 2021-11-07 ENCOUNTER — Encounter: Payer: Self-pay | Admitting: Radiation Oncology

## 2021-11-07 ENCOUNTER — Encounter: Payer: Self-pay | Admitting: Family Medicine

## 2021-11-07 ENCOUNTER — Other Ambulatory Visit: Payer: Self-pay

## 2021-11-07 DIAGNOSIS — M25532 Pain in left wrist: Secondary | ICD-10-CM

## 2021-11-08 ENCOUNTER — Encounter (HOSPITAL_BASED_OUTPATIENT_CLINIC_OR_DEPARTMENT_OTHER): Payer: Self-pay | Admitting: Radiation Oncology

## 2021-11-08 NOTE — Progress Notes (Addendum)
Spoke w/ via phone for pre-op interview---pt ?Lab needs dos----   none per anesthesia, left message with shirley at dr Sondra Come office requesting surgery orders             ?Lab results------echo 01-31-2021 epic, cbc with dif, bmp both done  [facility] ?COVID test -----patient states asymptomatic no test needed ?Arrive at ------- 845 am 11-10-2021 ?NPO after MN NO Solid Food.  Clear liquids from MN until---745 am ?Med rec completed ?Medications to take morning of surgery -----lexapro, protonix, xanax prn, lomotil prn, zofran prn, compazine prn. ?Diabetic medication -----n/a ?Patient instructed no nail polish to be worn day of surgery ?Patient instructed to bring photo id and insurance card day of surgery ?Patient aware to have Driver (ride ) / caregiver   husband dean Thayne  for 24 hours after surgery  ?Patient Special Instructions -----none ?Pre-Op special Istructions -----none ?Patient verbalized understanding of instructions that were given at this phone interview. ?Patient denies shortness of breath, chest pain, fever, cough at this phone interview.  ?

## 2021-11-09 NOTE — Anesthesia Preprocedure Evaluation (Addendum)
Anesthesia Evaluation  ?Patient identified by MRN, date of birth, ID band ?Patient awake ? ? ? ?Reviewed: ?Allergy & Precautions, NPO status , Patient's Chart, lab work & pertinent test results ? ?Airway ?Mallampati: I ? ?TM Distance: >3 FB ?Neck ROM: Full ? ? ? Dental ?no notable dental hx. ?(+) Teeth Intact, Dental Advisory Given ?  ?Pulmonary ?former smoker,  ?  ?Pulmonary exam normal ?breath sounds clear to auscultation ? ? ? ? ? ? Cardiovascular ?Exercise Tolerance: Good ?Normal cardiovascular exam ?Rhythm:Regular Rate:Normal ? ? ?  ?Neuro/Psych ?Anxiety   ? GI/Hepatic ?Neg liver ROS, GERD  ,  ?Endo/Other  ?negative endocrine ROS ? Renal/GU ?Lab Results ?          Component                Value               Date                 ?     CREATININE               0.76                11/04/2021           ?     BUN                      18                  11/04/2021           ?     K                        3.5                 11/04/2021           ?       ?Female GU complaint (Cervical CA) ? ? ?  ?Musculoskeletal ? ? Abdominal ?  ?Peds ? Hematology ? ?(+) Blood dyscrasia, anemia , Lab Results ?     Component                Value               Date                 ?     WBC                      5.6                 11/04/2021           ?     HGB                      8.6 (L)             11/04/2021           ?     HCT                      25.7 (L)            11/04/2021          ?     PLT  416 (H)             11/04/2021           ?   ?Anesthesia Other Findings ?NKA ? Reproductive/Obstetrics ?negative OB ROS ? ?  ? ? ? ? ? ? ? ? ? ? ? ? ? ?  ?  ? ? ? ? ? ? ? ?Anesthesia Physical ?Anesthesia Plan ? ?ASA: 3 ? ?Anesthesia Plan: General  ? ?Post-op Pain Management:   ? ?Induction: Intravenous ? ?PONV Risk Score and Plan: Treatment may vary due to age or medical condition, Midazolam, Ondansetron and Dexamethasone ? ?Airway Management Planned: LMA ? ?Additional Equipment:  None ? ?Intra-op Plan:  ? ?Post-operative Plan:  ? ?Informed Consent: I have reviewed the patients History and Physical, chart, labs and discussed the procedure including the risks, benefits and alternatives for the proposed anesthesia with the patient or authorized representative who has indicated his/her understanding and acceptance.  ? ? ? ?Dental advisory given ? ?Plan Discussed with: CRNA ? ?Anesthesia Plan Comments:   ? ? ? ? ? ?Anesthesia Quick Evaluation ? ?

## 2021-11-09 NOTE — Progress Notes (Signed)
?  Radiation Oncology         [phone] ?________________________________ ? ?Name: Taylor Buchanan MRN: [id number]  ?Date: 11/10/2021  DOB: October 26, 1955 ? ?CC: Leamon Arnt, MD  Lafonda Mosses, MD ? ?HDR BRACHYTHERAPY NOTE ? ?DIAGNOSIS: The encounter diagnosis was Malignant neoplasm of cervix, unspecified site [facility]). ?  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 ?NARRATIVE: ?The patient was brought to the [facility] suite. Identity was confirmed. All relevant records and images related to the planned course of therapy were reviewed. The patient freely provided informed written consent to proceed with treatment after reviewing the details related to the planned course of therapy. The consent form was witnessed and verified by the simulation staff. Then, the patient was set-up in a stable reproducible supine position for radiation therapy. The tandem ring system was accessed and fiducial markers were placed within the tandem and ring.  ? ?Simple treatment device note: ?On the operating room the patient had construction of her custom tandem ring system. She will be treated with a 45? tandem/ring system. The patient had placement of a 40 mm tandem. A cervical ring with a small shielding was used for her treatment. A rectal paddle was also part of her custom set up device. ? ?Verification simulation note: ?An AP and lateral film was obtained through the pelvis area. This was compared to the patient's planning films documenting accurate position of the tandem/ring system for treatment. ? ?High-dose-rate brachytherapy treatment note:   ?The remote afterloading device was accessed through catheter system and attached to the tandem ring system. Patient then proceeded to undergo her third high-dose-rate treatment directed at the cervix. The patient was prescribed a dose of 5.5 gray to be delivered to the [place]. Patient was treated with 2 channels using 24 dwell positions. Treatment time was 399.4 seconds. The  patient tolerated the procedure well. After completion of her therapy, a radiation survey was performed documenting return of the iridium source into the [employer] safe. The patient was then transferred to the nursing suite.  She then had removal of the rectal paddle followed by the tandem and ring system. The patient tolerated the removal well. ? ?PLAN: The patient will return next week for her fourth high-dose-rate treatment. ?________________________________   ? ?----------------------------------- ? ?Blair Promise, PhD, MD ? ?This document serves as a record of services personally performed by Gery Pray, MD. It was created on his behalf by Roney Mans, a trained medical scribe. The creation of this record is based on the scribe's personal observations and the provider's statements to them. This document has been checked and approved by the attending provider. ? ? ?

## 2021-11-10 ENCOUNTER — Ambulatory Visit
Admission: RE | Admit: 2021-11-10 | Discharge: 2021-11-10 | Disposition: A | Discharge status: Home / Self Care | Payer: Medicare Other | Source: Ambulatory Visit | Attending: Radiation Oncology | Admitting: Radiation Oncology

## 2021-11-10 ENCOUNTER — Encounter (HOSPITAL_COMMUNITY): Payer: Self-pay

## 2021-11-10 ENCOUNTER — Encounter (HOSPITAL_BASED_OUTPATIENT_CLINIC_OR_DEPARTMENT_OTHER)
Admission: RE | Disposition: A | Discharge status: Home / Self Care | Payer: Self-pay | Source: Ambulatory Visit | Attending: Radiation Oncology

## 2021-11-10 ENCOUNTER — Other Ambulatory Visit: Payer: Self-pay

## 2021-11-10 ENCOUNTER — Ambulatory Visit (HOSPITAL_COMMUNITY): Payer: Medicare Other

## 2021-11-10 ENCOUNTER — Encounter (HOSPITAL_BASED_OUTPATIENT_CLINIC_OR_DEPARTMENT_OTHER): Payer: Self-pay | Admitting: Radiation Oncology

## 2021-11-10 ENCOUNTER — Ambulatory Visit (HOSPITAL_BASED_OUTPATIENT_CLINIC_OR_DEPARTMENT_OTHER): Payer: Medicare Other | Admitting: Anesthesiology

## 2021-11-10 ENCOUNTER — Ambulatory Visit (HOSPITAL_COMMUNITY)
Admission: RE | Admit: 2021-11-10 | Discharge: 2021-11-10 | Disposition: A | Discharge status: Home / Self Care | Payer: Medicare Other | Source: Ambulatory Visit | Attending: Radiation Oncology | Admitting: Radiation Oncology

## 2021-11-10 ENCOUNTER — Ambulatory Visit (HOSPITAL_BASED_OUTPATIENT_CLINIC_OR_DEPARTMENT_OTHER)
Admission: RE | Admit: 2021-11-10 | Discharge: 2021-11-10 | Disposition: A | Discharge status: Home / Self Care | Payer: Medicare Other | Source: Ambulatory Visit | Attending: Radiation Oncology | Admitting: Radiation Oncology

## 2021-11-10 VITALS — BP 111/64 | HR 68 | Temp 97.8°F | Resp 18

## 2021-11-10 DIAGNOSIS — F419 Anxiety disorder, unspecified: Secondary | ICD-10-CM | POA: Insufficient documentation

## 2021-11-10 DIAGNOSIS — D630 Anemia in neoplastic disease: Secondary | ICD-10-CM | POA: Diagnosis not present

## 2021-11-10 DIAGNOSIS — Z9221 Personal history of antineoplastic chemotherapy: Secondary | ICD-10-CM | POA: Diagnosis not present

## 2021-11-10 DIAGNOSIS — Z923 Personal history of irradiation: Secondary | ICD-10-CM | POA: Diagnosis not present

## 2021-11-10 DIAGNOSIS — K219 Gastro-esophageal reflux disease without esophagitis: Secondary | ICD-10-CM | POA: Insufficient documentation

## 2021-11-10 DIAGNOSIS — D649 Anemia, unspecified: Secondary | ICD-10-CM | POA: Diagnosis not present

## 2021-11-10 DIAGNOSIS — Z87891 Personal history of nicotine dependence: Secondary | ICD-10-CM | POA: Diagnosis not present

## 2021-11-10 DIAGNOSIS — Z79899 Other long term (current) drug therapy: Secondary | ICD-10-CM | POA: Insufficient documentation

## 2021-11-10 DIAGNOSIS — C539 Malignant neoplasm of cervix uteri, unspecified: Secondary | ICD-10-CM

## 2021-11-10 DIAGNOSIS — D759 Disease of blood and blood-forming organs, unspecified: Secondary | ICD-10-CM | POA: Insufficient documentation

## 2021-11-10 HISTORY — PX: OPERATIVE ULTRASOUND: SHX5996

## 2021-11-10 HISTORY — PX: TANDEM RING INSERTION: SHX6199

## 2021-11-10 SURGERY — INSERTION, UTERINE TANDEM AND RING OR CYLINDER, FOR BRACHYTHERAPY
Anesthesia: General | Site: Cervix

## 2021-11-10 MED ORDER — LACTATED RINGERS IV SOLN: INTRAVENOUS | Status: DC

## 2021-11-10 MED ORDER — EPHEDRINE 5 MG/ML INJ
INTRAVENOUS | Status: AC
Start: 2021-11-10 — End: ?
  Filled 2021-11-10: qty 5

## 2021-11-10 MED ORDER — KETOROLAC TROMETHAMINE 30 MG/ML IJ SOLN
INTRAMUSCULAR | Status: AC
  Filled 2021-11-10: qty 1

## 2021-11-10 MED ORDER — MIDAZOLAM HCL 5 MG/5ML IJ SOLN
INTRAMUSCULAR | Status: DC | PRN
Start: 2021-11-10 — End: 2021-11-10
  Administered 2021-11-10: 2 mg via INTRAVENOUS

## 2021-11-10 MED ORDER — CHLORHEXIDINE GLUCONATE CLOTH 2 % EX PADS: 6.0000 | MEDICATED_PAD | Freq: Every day | CUTANEOUS | Status: DC

## 2021-11-10 MED ORDER — FENTANYL CITRATE (PF) 100 MCG/2ML IJ SOLN
INTRAMUSCULAR | Status: AC
  Filled 2021-11-10: qty 2

## 2021-11-10 MED ORDER — EPHEDRINE 5 MG/ML INJ
INTRAVENOUS | Status: AC
  Filled 2021-11-10: qty 5

## 2021-11-10 MED ORDER — PROPOFOL 10 MG/ML IV BOLUS
INTRAVENOUS | Status: DC | PRN
  Administered 2021-11-10: 120 mg via INTRAVENOUS

## 2021-11-10 MED ORDER — MIDAZOLAM HCL 2 MG/2ML IJ SOLN
INTRAMUSCULAR | Status: AC
  Filled 2021-11-10: qty 2

## 2021-11-10 MED ORDER — DEXAMETHASONE SODIUM PHOSPHATE 10 MG/ML IJ SOLN
INTRAMUSCULAR | Status: DC | PRN
  Administered 2021-11-10: 10 mg via INTRAVENOUS

## 2021-11-10 MED ORDER — SODIUM CHLORIDE 0.9% FLUSH: 10.0000 mL | Freq: Two times a day (BID) | INTRAVENOUS | Status: DC

## 2021-11-10 MED ORDER — GLYCOPYRROLATE PF 0.2 MG/ML IJ SOSY
PREFILLED_SYRINGE | INTRAMUSCULAR | Status: DC | PRN
  Administered 2021-11-10: .2 mg via INTRAVENOUS

## 2021-11-10 MED ORDER — HYDROMORPHONE HCL 1 MG/ML IJ SOLN
0.5000 mg | Freq: Once | INTRAMUSCULAR | Status: AC
  Administered 2021-11-10: 0.5 mg via INTRAVENOUS
  Filled 2021-11-10: qty 1

## 2021-11-10 MED ORDER — SODIUM CHLORIDE 0.9% FLUSH
10.0000 mL | Freq: Once | INTRAVENOUS | Status: AC
  Administered 2021-11-10: 10 mL via INTRAVENOUS

## 2021-11-10 MED ORDER — WHITE PETROLATUM EX OINT
TOPICAL_OINTMENT | CUTANEOUS | Status: AC
  Filled 2021-11-10: qty 5

## 2021-11-10 MED ORDER — SODIUM CHLORIDE 0.9% FLUSH: 10.0000 mL | INTRAVENOUS | Status: DC | PRN

## 2021-11-10 MED ORDER — KETOROLAC TROMETHAMINE 30 MG/ML IJ SOLN
INTRAMUSCULAR | Status: DC | PRN
  Administered 2021-11-10: 30 mg via INTRAVENOUS

## 2021-11-10 MED ORDER — ONDANSETRON HCL 4 MG/2ML IJ SOLN
INTRAMUSCULAR | Status: DC | PRN
  Administered 2021-11-10: 4 mg via INTRAVENOUS

## 2021-11-10 MED ORDER — LIDOCAINE HCL (PF) 2 % IJ SOLN
INTRAMUSCULAR | Status: AC
  Filled 2021-11-10: qty 5

## 2021-11-10 MED ORDER — LACTATED RINGERS IV SOLN
INTRAVENOUS | Status: DC
  Filled 2021-11-10 (×2): qty 250

## 2021-11-10 MED ORDER — SODIUM CHLORIDE 0.9 % IR SOLN
Status: DC | PRN
  Administered 2021-11-10: 1000 mL

## 2021-11-10 MED ORDER — LIDOCAINE 2% (20 MG/ML) 5 ML SYRINGE
INTRAMUSCULAR | Status: DC | PRN
  Administered 2021-11-10: 100 mg via INTRAVENOUS

## 2021-11-10 MED ORDER — FENTANYL CITRATE (PF) 100 MCG/2ML IJ SOLN
INTRAMUSCULAR | Status: DC | PRN
  Administered 2021-11-10 (×2): 50 ug via INTRAVENOUS

## 2021-11-10 MED ORDER — PROPOFOL 10 MG/ML IV BOLUS
INTRAVENOUS | Status: AC
  Filled 2021-11-10: qty 20

## 2021-11-10 MED ORDER — HEPARIN SOD (PORK) LOCK FLUSH 100 UNIT/ML IV SOLN
500.0000 [IU] | Freq: Once | INTRAVENOUS | Status: AC
  Administered 2021-11-10: 500 [IU] via INTRAVENOUS

## 2021-11-10 MED ORDER — ONDANSETRON HCL 4 MG/2ML IJ SOLN
INTRAMUSCULAR | Status: AC
  Filled 2021-11-10: qty 2

## 2021-11-10 MED ORDER — EPHEDRINE SULFATE-NACL 50-0.9 MG/10ML-% IV SOSY
PREFILLED_SYRINGE | INTRAVENOUS | Status: DC | PRN
  Administered 2021-11-10 (×2): 10 mg via INTRAVENOUS
  Administered 2021-11-10: 15 mg via INTRAVENOUS

## 2021-11-10 MED ORDER — DEXAMETHASONE SODIUM PHOSPHATE 10 MG/ML IJ SOLN
INTRAMUSCULAR | Status: AC
  Filled 2021-11-10: qty 1

## 2021-11-10 SURGICAL SUPPLY — 19 items
BNDG CONFORM 2 STRL LF (GAUZE/BANDAGES/DRESSINGS) IMPLANT
DILATOR CANAL MILEX (MISCELLANEOUS) IMPLANT
DRSG PAD ABDOMINAL 8X10 ST (GAUZE/BANDAGES/DRESSINGS) ×2 IMPLANT
DRSG TELFA 3X8 NADH (GAUZE/BANDAGES/DRESSINGS) ×2 IMPLANT
GAUZE 4X4 16PLY ~~LOC~~+RFID DBL (SPONGE) ×4 IMPLANT
GLOVE SURG ENC MOIS LTX SZ7.5 (GLOVE) ×4 IMPLANT
GOWN STRL REUS W/TWL LRG LVL3 (GOWN DISPOSABLE) ×2 IMPLANT
HOLDER FOLEY CATH W/STRAP (MISCELLANEOUS) ×2 IMPLANT
IV NS 1000ML (IV SOLUTION) ×2
IV NS 1000ML BAXH (IV SOLUTION) ×1 IMPLANT
IV SET EXTENSION GRAVITY 40 LF (IV SETS) ×2 IMPLANT
KIT TURNOVER CYSTO (KITS) ×2 IMPLANT
MAT PREVALON FULL STRYKER (MISCELLANEOUS) ×2 IMPLANT
PACK VAGINAL MINOR WOMEN LF (CUSTOM PROCEDURE TRAY) ×2 IMPLANT
PACKING VAGINAL (PACKING) IMPLANT
PAD DRESSING TELFA 3X8 NADH (GAUZE/BANDAGES/DRESSINGS) ×1 IMPLANT
TOWEL OR 17X26 10 PK STRL BLUE (TOWEL DISPOSABLE) ×2 IMPLANT
TRAY FOLEY W/BAG SLVR 14FR LF (SET/KITS/TRAYS/PACK) ×2 IMPLANT
WATER STERILE IRR 500ML POUR (IV SOLUTION) ×2 IMPLANT

## 2021-11-10 NOTE — Op Note (Signed)
11/10/2021 ? ?12:00 PM ? ?PATIENT:  Taylor Buchanan  66 y.o. female ? ?PRE-OPERATIVE DIAGNOSIS:  CERVICAL CANCER ? ?POST-OPERATIVE DIAGNOSIS:  CERVICAL CANCER ? ?PROCEDURE:  Procedure(s): ?TANDEM RING INSERTION (N/A) ?OPERATIVE ULTRASOUND (N/A) ? ?SURGEON:  Surgeon(s) and Role: ?   Gery Pray, MD - Primary ? ?PHYSICIAN ASSISTANT:  ? ?ASSISTANTS: none  ? ?ANESTHESIA:   general ? ?EBL:  0 mL  ? ?BLOOD ADMINISTERED:none ? ?DRAINS: Urinary Catheter (Foley)  ? ?LOCAL MEDICATIONS USED:  NONE ? ?SPECIMEN:  No Specimen ? ?DISPOSITION OF SPECIMEN:  N/A ? ?COUNTS:  YES ? ?TOURNIQUET:  * No tourniquets in log * ? ?DICTATION: The patient was prepped and draped in usual sterile fashion and placed in the dorsolithotomy position.  Timeout for the procedure, preoperative medications and antibiotics was performed.  The bladder was backfilled with approximately 200 cc of sterile water for ultrasound imaging purposes. the patient proceeded to undergo exam under anesthesia.  There was erythema throughout the cervical region.  On bimanual and rectovaginal examination the cervix was firm but no extent into the parametrial tissues. excellent images on transabdominal ultrasound were obtained.   The uterus was anteverted.  The 40 mm tandem covered by a cervical sleeve was placed within the endometrial cavity.  Ultrasound images showed good placement of the 45 degree , 40 mm tandem within the upper three quarters of the uterus.  She then proceeded to have placement of a 45 degree cervical ring with a small shielding cap in place.  It was then followed by placement of a rectal paddle posteriorly to limit dose to the rectal mucosal.  She tolerated the procedure well.  Patient was subsequently taken to the recovery room in stable condition.  Later in the day the patient will be taken to radiation oncology for simulation, planning and her third high-dose-rate treatment.  She is to receive 5.5 Pearline Cables to the high risk clinical target volume.   Iridium 192 will be the high-dose-rate source. ? ?PLAN OF CARE:  Transfer to radiation oncology for planning and treatment ? ?PATIENT DISPOSITION:  PACU - hemodynamically stable. ?  ?Delay start of Pharmacological VTE agent (>24hrs) due to surgical blood loss or risk of bleeding: not applicable ? ?

## 2021-11-10 NOTE — Interval H&P Note (Signed)
History and Physical Interval Note: ? ?11/10/2021 ?10:47 AM ? ?Taylor Buchanan  has presented today for surgery, with the diagnosis of CERVICAL CANCER.  The various methods of treatment have been discussed with the patient and family. After consideration of risks, benefits and other options for treatment, the patient has consented to  Procedure(s): ?TANDEM RING INSERTION (N/A) ?OPERATIVE ULTRASOUND (N/A) as a surgical intervention.  The patient's history has been reviewed, patient examined, no change in status, stable for surgery.  I have reviewed the patient's chart and labs.  Questions were answered to the patient's satisfaction.   ? ? ?Gery Pray ? ? ?

## 2021-11-10 NOTE — Anesthesia Procedure Notes (Signed)
Procedure Name: LMA Insertion ?Date/Time: 11/10/2021 11:00 AM ?Performed by: Clearnce Sorrel, CRNA ?Pre-anesthesia Checklist: Patient identified, Emergency Drugs available, Suction available and Patient being monitored ?Patient Re-evaluated:Patient Re-evaluated prior to induction ?Oxygen Delivery Method: Circle System Utilized ?Preoxygenation: Pre-oxygenation with 100% oxygen ?Induction Type: IV induction ?Ventilation: Mask ventilation without difficulty ?LMA: LMA inserted ?LMA Size: 4.0 ?Number of attempts: 1 ?Airway Equipment and Method: Bite block ?Placement Confirmation: positive ETCO2 ?Tube secured with: Tape ?Dental Injury: Teeth and Oropharynx as per pre-operative assessment  ? ? ? ? ?

## 2021-11-10 NOTE — Patient Instructions (Signed)
IMMEDIATELY FOLLOWING SURGERY: Do not drive or operate machinery for the first twenty four hours after surgery. Do not make any important decisions for twenty four hours after surgery or while taking narcotic pain medications or sedatives. If you develop intractable nausea and vomiting or a severe headache please notify your doctor immediately.  ? ?FOLLOW-UP: You do not need to follow up with anesthesia unless specifically instructed to do so.  ? ?WOUND CARE INSTRUCTIONS (if applicable): Expect some mild vaginal bleeding, but if large amount of bleeding occurs please contact Dr. Sondra Come at [phone] or the Radiation On-Call physician. Call for any fever greater than 101.0 degrees or increasing vaginal//abdominal pain or trouble urinating.  ? ?QUESTIONS?: Please feel free to call your physician or the hospital operator if you have any questions, and they will be happy to assist you. Resume all medications: as listed on your after visit summary. Your next appointment is: ? ?Future Appointments  ?Date Time Provider [place]  ?11/10/2021  3:00 PM Gery Pray, MD [facility] None  ?11/18/2021  7:30 AM [facility] 2 [facility]US [place]  ?11/18/2021  9:30 AM Gery Pray, MD [facility] None  ?11/18/2021  2:00 PM Gery Pray, MD [facility] None  ?11/22/2021 10:30 AM [facility]-US 2 [facility]-US [place]  ?11/22/2021  1:30 PM Gery Pray, MD [facility] None  ?11/22/2021  3:00 PM Gery Pray, MD [facility] None  ?12/12/2021  9:45 AM [facility] None  ?12/12/2021 10:20 AM Heath Lark, MD [facility] None  ?01/25/2022 11:00 AM Leamon Arnt, MD [facility]  ? ? ?  ?

## 2021-11-10 NOTE — Anesthesia Postprocedure Evaluation (Signed)
Anesthesia Post Note ? ?Patient: Taylor Buchanan ? ?Procedure(s) Performed: TANDEM RING INSERTION (Cervix) ?OPERATIVE ULTRASOUND ([place]) ? ?  ? ?Patient location during evaluation: PACU ?Anesthesia Type: General ?Level of consciousness: awake and alert ?Pain management: pain level controlled ?Vital Signs Assessment: post-procedure vital signs reviewed and stable ?Respiratory status: spontaneous breathing, nonlabored ventilation, respiratory function stable and patient connected to nasal cannula oxygen ?Cardiovascular status: blood pressure returned to baseline and stable ?Postop Assessment: no apparent nausea or vomiting ?Anesthetic complications: no ? ? ?No notable events documented. ? ?Last Vitals:  ?Vitals:  ? 11/10/21 1200 11/10/21 1215  ?BP: 119/67   ?Pulse: 77 76  ?Resp: (!) 21 20  ?Temp:    ?SpO2: 97% 99%  ?  ?Last Pain:  ?Vitals:  ? 11/10/21 1227  ?TempSrc:   ?PainSc: 0-No pain  ? ? ?  ?  ?  ?  ?  ?  ? ?[name] ? ? ? ? ?

## 2021-11-10 NOTE — Transfer of Care (Signed)
Immediate Anesthesia Transfer of Care Note ? ?Patient: LAUREL HARNDEN ? ?Procedure(s) Performed: TANDEM RING INSERTION (Cervix) ?OPERATIVE ULTRASOUND (Cervix) ? ?Patient Location: PACU ? ?Anesthesia Type:MAC ? ?Level of Consciousness: awake, alert , oriented and patient cooperative ? ?Airway & Oxygen Therapy: Patient Spontanous Breathing ? ?Post-op Assessment: Report given to RN and Post -op Vital signs reviewed and stable ? ?Post vital signs: Reviewed and stable ? ?Last Vitals:  ?Vitals Value Taken Time  ?BP 122/69 11/10/21 1145  ?Temp 36.5 ?C 11/10/21 1145  ?Pulse 100 11/10/21 1145  ?Resp 21 11/10/21 1145  ?SpO2 94 % 11/10/21 1145  ?Vitals shown include unvalidated device data. ? ?Last Pain:  ?Vitals:  ? 11/10/21 0935  ?TempSrc: Oral  ?PainSc: 0-No pain  ?   ? ?Patients Stated Pain Goal: 6 (11/10/21 0935) ? ?Complications: No notable events documented. ?

## 2021-11-11 ENCOUNTER — Ambulatory Visit: Payer: Medicare Other | Admitting: Radiation Oncology

## 2021-11-11 ENCOUNTER — Other Ambulatory Visit (HOSPITAL_COMMUNITY): Payer: Medicare Other

## 2021-11-11 ENCOUNTER — Encounter (HOSPITAL_BASED_OUTPATIENT_CLINIC_OR_DEPARTMENT_OTHER): Payer: Self-pay | Admitting: Radiation Oncology

## 2021-11-14 ENCOUNTER — Ambulatory Visit: Payer: Medicare Other | Admitting: Radiation Oncology

## 2021-11-14 ENCOUNTER — Other Ambulatory Visit: Payer: Self-pay

## 2021-11-14 ENCOUNTER — Encounter (HOSPITAL_BASED_OUTPATIENT_CLINIC_OR_DEPARTMENT_OTHER): Payer: Self-pay | Admitting: Radiation Oncology

## 2021-11-14 ENCOUNTER — Other Ambulatory Visit (HOSPITAL_COMMUNITY): Payer: Medicare Other

## 2021-11-14 ENCOUNTER — Encounter (HOSPITAL_BASED_OUTPATIENT_CLINIC_OR_DEPARTMENT_OTHER): Payer: Self-pay

## 2021-11-14 ENCOUNTER — Ambulatory Visit (HOSPITAL_BASED_OUTPATIENT_CLINIC_OR_DEPARTMENT_OTHER): Admit: 2021-11-14 | Payer: Medicare Other | Admitting: Radiation Oncology

## 2021-11-14 SURGERY — INSERTION, UTERINE TANDEM AND RING OR CYLINDER, FOR BRACHYTHERAPY
Anesthesia: General

## 2021-11-14 NOTE — Progress Notes (Signed)
Spoke w/ via phone for pre-op interview: patient ?Lab needs dos: none ?Lab results: Echo 01/31/21, EKG 10/02/21, CBC and CMP 11/04/21 ?COVID test: patient states asymptomatic no test needed. ?Arrive at [place] 11/18/21 ?NPO after MN except clear liquids.Clear liquids from MN until 0430 ?Med rec completed. ?Medications to take morning of surgery: Lexapro and Protonix ?Diabetic medication: NA ?Patient instructed no nail polish to be worn day of surgery. ?Patient instructed to bring photo id and insurance card day of surgery.  ?Patient aware to have Driver (ride ) / caregiver for 24 hours after surgery. (Husband to drive) ?Patient Special Instructions: NA ?Pre-Op special Istructions: NA ?Patient verbalized understanding of instructions that were given at this phone interview. ?Patient denies shortness of breath, chest pain, fever, cough at this phone interview.  ?

## 2021-11-17 ENCOUNTER — Other Ambulatory Visit: Payer: Self-pay

## 2021-11-17 ENCOUNTER — Encounter (HOSPITAL_BASED_OUTPATIENT_CLINIC_OR_DEPARTMENT_OTHER): Payer: Self-pay | Admitting: Radiation Oncology

## 2021-11-17 NOTE — Progress Notes (Signed)
?  Radiation Oncology         [phone] ?________________________________ ? ?Name: Taylor Buchanan MRN: [id number]  ?Date: 11/18/2021  DOB: 09/22/1955 ? ?CC: Leamon Arnt, MD  Lafonda Mosses, MD ? ?HDR BRACHYTHERAPY NOTE ? ?DIAGNOSIS: The encounter diagnosis was Malignant neoplasm of cervix, unspecified site [facility]). ?  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 ?NARRATIVE: ?The patient was brought to the [place] suite. Identity was confirmed. All relevant records and images related to the planned course of therapy were reviewed. The patient freely provided informed written consent to proceed with treatment after reviewing the details related to the planned course of therapy. The consent form was witnessed and verified by the simulation staff. Then, the patient was set-up in a stable reproducible supine position for radiation therapy. The tandem ring system was accessed and fiducial markers were placed within the tandem and ring.  ? ?Simple treatment device note: ?On the operating room the patient had construction of her custom tandem ring system. She will be treated with a 45? tandem/ring system. The patient had placement of a 40 mm tandem. A cervical ring with a small shielding was used for her treatment. A rectal paddle was also part of her custom set up device. ? ?Verification simulation note: ?An AP and lateral film was obtained through the pelvis area. This was compared to the patient's planning films documenting accurate position of the tandem/ring system for treatment. ? ?High-dose-rate brachytherapy treatment note:   ?The remote afterloading device was accessed through catheter system and attached to the tandem ring system. Patient then proceeded to undergo her fourth high-dose-rate treatment directed at the cervix. The patient was prescribed a dose of 5.5 gray to be delivered to the [place]. Patient was treated with 2 channels using 22 dwell positions. Treatment time was 416.40 seconds.  The patient tolerated the procedure well. After completion of her therapy, a radiation survey was performed documenting return of the iridium source into the GammaMed safe. The patient was then transferred to the nursing suite.  She then had removal of the rectal paddle followed by the tandem and ring system. The patient tolerated the removal well. ? ?PLAN: The patient will return early next week for her fifth high-dose-rate treatment. ?________________________________   ? ?----------------------------------- ? ?Blair Promise, PhD, MD ? ?This document serves as a record of services personally performed by Gery Pray, MD. It was created on his behalf by Roney Mans, a trained medical scribe. The creation of this record is based on the scribe's personal observations and the provider's statements to them. This document has been checked and approved by the attending provider. ? ? ?

## 2021-11-17 NOTE — Anesthesia Preprocedure Evaluation (Addendum)
Anesthesia Evaluation  ?Patient identified by MRN, date of birth, ID band ?Patient awake ? ? ? ?Reviewed: ?Allergy & Precautions, NPO status , Patient's Chart, lab work & pertinent test results ? ?Airway ?Mallampati: I ? ?TM Distance: >3 FB ?Neck ROM: Full ? ? ? Dental ? ?(+) Teeth Intact, Dental Advisory Given ?  ?Pulmonary ?former smoker,  ?15 pack year history, quit smoking 1997 ?  ?Pulmonary exam normal ?breath sounds clear to auscultation ? ? ? ? ? ? Cardiovascular ?negative cardio ROS ?Normal cardiovascular exam ?Rhythm:Regular Rate:Normal ? ? ?  ?Neuro/Psych ? Headaches, PSYCHIATRIC DISORDERS Anxiety Depression TIA  ? GI/Hepatic ?Neg liver ROS, GERD  Medicated and Controlled,  ?Endo/Other  ?negative endocrine ROS ? Renal/GU ?negative Renal ROS  ?negative genitourinary ?  ?Musculoskeletal ? ?(+) Arthritis , Osteoarthritis,   ? Abdominal ?  ?Peds ? Hematology ? ?(+) Blood dyscrasia, anemia , Hb 8.6, plt 416   ?Anesthesia Other Findings ? ? Reproductive/Obstetrics ?Cervical ca ? ?  ? ? ? ? ? ? ? ? ? ? ? ? ? ?  ?  ? ? ? ? ? ? ? ?Anesthesia Physical ?Anesthesia Plan ? ?ASA: 2 ? ?Anesthesia Plan: General  ? ?Post-op Pain Management: Tylenol PO (pre-op)*  ? ?Induction: Intravenous ? ?PONV Risk Score and Plan: 4 or greater and Ondansetron, Dexamethasone, Midazolam and Treatment may vary due to age or medical condition ? ?Airway Management Planned: LMA ? ?Additional Equipment: None ? ?Intra-op Plan:  ? ?Post-operative Plan: Extubation in OR ? ?Informed Consent: I have reviewed the patients History and Physical, chart, labs and discussed the procedure including the risks, benefits and alternatives for the proposed anesthesia with the patient or authorized representative who has indicated his/her understanding and acceptance.  ? ? ? ?Dental advisory given ? ?Plan Discussed with: CRNA ? ?Anesthesia Plan Comments: (4th tandem ring- LMA 4 in past w/o issue)  ? ? ? ? ? ?Anesthesia Quick  Evaluation ? ?

## 2021-11-18 ENCOUNTER — Encounter (HOSPITAL_BASED_OUTPATIENT_CLINIC_OR_DEPARTMENT_OTHER)
Admission: RE | Disposition: A | Discharge status: Other Acute Inpatient Hospital | Payer: Self-pay | Source: Ambulatory Visit | Attending: Radiation Oncology

## 2021-11-18 ENCOUNTER — Other Ambulatory Visit: Payer: Self-pay

## 2021-11-18 ENCOUNTER — Ambulatory Visit
Admission: RE | Admit: 2021-11-18 | Discharge: 2021-11-18 | Disposition: A | Discharge status: Home / Self Care | Payer: Medicare Other | Source: Ambulatory Visit | Attending: Radiation Oncology | Admitting: Radiation Oncology

## 2021-11-18 ENCOUNTER — Encounter (HOSPITAL_BASED_OUTPATIENT_CLINIC_OR_DEPARTMENT_OTHER): Payer: Self-pay | Admitting: Radiation Oncology

## 2021-11-18 ENCOUNTER — Ambulatory Visit (HOSPITAL_BASED_OUTPATIENT_CLINIC_OR_DEPARTMENT_OTHER): Payer: Medicare Other | Admitting: Anesthesiology

## 2021-11-18 ENCOUNTER — Ambulatory Visit (HOSPITAL_BASED_OUTPATIENT_CLINIC_OR_DEPARTMENT_OTHER)
Admission: RE | Admit: 2021-11-18 | Discharge: 2021-11-18 | Disposition: A | Discharge status: Other Acute Inpatient Hospital | Payer: Medicare Other | Source: Ambulatory Visit | Attending: Radiation Oncology | Admitting: Radiation Oncology

## 2021-11-18 ENCOUNTER — Ambulatory Visit (HOSPITAL_COMMUNITY)
Admission: RE | Admit: 2021-11-18 | Discharge: 2021-11-18 | Disposition: A | Discharge status: Home / Self Care | Payer: Medicare Other | Source: Ambulatory Visit | Attending: Radiation Oncology | Admitting: Radiation Oncology

## 2021-11-18 VITALS — BP 98/60 | HR 65 | Temp 97.0°F | Resp 18

## 2021-11-18 DIAGNOSIS — F32A Depression, unspecified: Secondary | ICD-10-CM | POA: Insufficient documentation

## 2021-11-18 DIAGNOSIS — Z87891 Personal history of nicotine dependence: Secondary | ICD-10-CM | POA: Diagnosis not present

## 2021-11-18 DIAGNOSIS — C539 Malignant neoplasm of cervix uteri, unspecified: Secondary | ICD-10-CM

## 2021-11-18 DIAGNOSIS — F419 Anxiety disorder, unspecified: Secondary | ICD-10-CM | POA: Insufficient documentation

## 2021-11-18 DIAGNOSIS — Z9221 Personal history of antineoplastic chemotherapy: Secondary | ICD-10-CM | POA: Insufficient documentation

## 2021-11-18 DIAGNOSIS — Z8673 Personal history of transient ischemic attack (TIA), and cerebral infarction without residual deficits: Secondary | ICD-10-CM | POA: Insufficient documentation

## 2021-11-18 DIAGNOSIS — K219 Gastro-esophageal reflux disease without esophagitis: Secondary | ICD-10-CM | POA: Insufficient documentation

## 2021-11-18 DIAGNOSIS — Z923 Personal history of irradiation: Secondary | ICD-10-CM | POA: Insufficient documentation

## 2021-11-18 HISTORY — PX: OPERATIVE ULTRASOUND: SHX5996

## 2021-11-18 HISTORY — PX: TANDEM RING INSERTION: SHX6199

## 2021-11-18 SURGERY — INSERTION, UTERINE TANDEM AND RING OR CYLINDER, FOR BRACHYTHERAPY
Anesthesia: General | Site: Vagina

## 2021-11-18 MED ORDER — HYDROMORPHONE HCL 1 MG/ML IJ SOLN: 0.2500 mg | INTRAMUSCULAR | Status: DC | PRN

## 2021-11-18 MED ORDER — PROPOFOL 10 MG/ML IV BOLUS
INTRAVENOUS | Status: AC
  Filled 2021-11-18: qty 20

## 2021-11-18 MED ORDER — DEXAMETHASONE SODIUM PHOSPHATE 10 MG/ML IJ SOLN
INTRAMUSCULAR | Status: DC | PRN
  Administered 2021-11-18 (×2): 5 mg via INTRAVENOUS

## 2021-11-18 MED ORDER — KETOROLAC TROMETHAMINE 30 MG/ML IJ SOLN
INTRAMUSCULAR | Status: AC
  Filled 2021-11-18: qty 1

## 2021-11-18 MED ORDER — DEXAMETHASONE SODIUM PHOSPHATE 10 MG/ML IJ SOLN
INTRAMUSCULAR | Status: AC
  Filled 2021-11-18: qty 1

## 2021-11-18 MED ORDER — ONDANSETRON HCL 4 MG/2ML IJ SOLN
INTRAMUSCULAR | Status: AC
  Filled 2021-11-18: qty 2

## 2021-11-18 MED ORDER — ONDANSETRON HCL 4 MG/2ML IJ SOLN: 4.0000 mg | Freq: Once | INTRAMUSCULAR | Status: DC | PRN

## 2021-11-18 MED ORDER — LIDOCAINE 2% (20 MG/ML) 5 ML SYRINGE
INTRAMUSCULAR | Status: DC | PRN
  Administered 2021-11-18: 60 mg via INTRAVENOUS

## 2021-11-18 MED ORDER — SODIUM CHLORIDE 0.9% FLUSH
10.0000 mL | Freq: Once | INTRAVENOUS | Status: AC
  Administered 2021-11-18: 10 mL via INTRAVENOUS

## 2021-11-18 MED ORDER — FENTANYL CITRATE (PF) 100 MCG/2ML IJ SOLN
INTRAMUSCULAR | Status: AC
  Filled 2021-11-18: qty 2

## 2021-11-18 MED ORDER — EPHEDRINE SULFATE-NACL 50-0.9 MG/10ML-% IV SOSY
PREFILLED_SYRINGE | INTRAVENOUS | Status: DC | PRN
  Administered 2021-11-18 (×2): 10 mg via INTRAVENOUS

## 2021-11-18 MED ORDER — PROPOFOL 10 MG/ML IV BOLUS
INTRAVENOUS | Status: DC | PRN
  Administered 2021-11-18: 150 mg via INTRAVENOUS

## 2021-11-18 MED ORDER — ACETAMINOPHEN 500 MG PO TABS
1000.0000 mg | ORAL_TABLET | Freq: Once | ORAL | Status: AC
  Administered 2021-11-18: 1000 mg via ORAL

## 2021-11-18 MED ORDER — ONDANSETRON HCL 4 MG/2ML IJ SOLN
INTRAMUSCULAR | Status: DC | PRN
  Administered 2021-11-18: 4 mg via INTRAVENOUS

## 2021-11-18 MED ORDER — FENTANYL CITRATE (PF) 100 MCG/2ML IJ SOLN
INTRAMUSCULAR | Status: DC | PRN
Start: 2021-11-18 — End: 2021-11-18
  Administered 2021-11-18: 50 ug via INTRAVENOUS

## 2021-11-18 MED ORDER — LIDOCAINE HCL (PF) 2 % IJ SOLN
INTRAMUSCULAR | Status: AC
  Filled 2021-11-18: qty 5

## 2021-11-18 MED ORDER — HEPARIN SOD (PORK) LOCK FLUSH 100 UNIT/ML IV SOLN
500.0000 [IU] | Freq: Once | INTRAVENOUS | Status: AC
  Administered 2021-11-18: 500 [IU] via INTRAVENOUS

## 2021-11-18 MED ORDER — ACETAMINOPHEN 500 MG PO TABS
ORAL_TABLET | ORAL | Status: AC
  Filled 2021-11-18: qty 2

## 2021-11-18 MED ORDER — LACTATED RINGERS IV SOLN
INTRAVENOUS | Status: DC
  Filled 2021-11-18 (×2): qty 250

## 2021-11-18 MED ORDER — MIDAZOLAM HCL 2 MG/2ML IJ SOLN
INTRAMUSCULAR | Status: AC
  Filled 2021-11-18: qty 2

## 2021-11-18 MED ORDER — KETOROLAC TROMETHAMINE 30 MG/ML IJ SOLN
INTRAMUSCULAR | Status: DC | PRN
  Administered 2021-11-18: 30 mg via INTRAVENOUS

## 2021-11-18 MED ORDER — OXYCODONE HCL 5 MG/5ML PO SOLN: 5.0000 mg | Freq: Once | ORAL | Status: DC | PRN

## 2021-11-18 MED ORDER — PHENYLEPHRINE HCL (PRESSORS) 10 MG/ML IV SOLN
INTRAVENOUS | Status: DC | PRN
  Administered 2021-11-18 (×3): 80 ug via INTRAVENOUS

## 2021-11-18 MED ORDER — MIDAZOLAM HCL 2 MG/2ML IJ SOLN
INTRAMUSCULAR | Status: DC | PRN
  Administered 2021-11-18: 2 mg via INTRAVENOUS

## 2021-11-18 MED ORDER — AMISULPRIDE (ANTIEMETIC) 5 MG/2ML IV SOLN: 10.0000 mg | Freq: Once | INTRAVENOUS | Status: DC | PRN

## 2021-11-18 MED ORDER — LACTATED RINGERS IV SOLN: INTRAVENOUS | Status: DC

## 2021-11-18 MED ORDER — SODIUM CHLORIDE 0.9 % IR SOLN
Status: DC | PRN
  Administered 2021-11-18: 200 mL

## 2021-11-18 MED ORDER — OXYCODONE HCL 5 MG PO TABS: 5.0000 mg | ORAL_TABLET | Freq: Once | ORAL | Status: DC | PRN

## 2021-11-18 SURGICAL SUPPLY — 16 items
DRSG PAD ABDOMINAL 8X10 ST (GAUZE/BANDAGES/DRESSINGS) ×3 IMPLANT
DRSG TELFA 3X8 NADH (GAUZE/BANDAGES/DRESSINGS) ×3 IMPLANT
GLOVE SURG ENC MOIS LTX SZ7.5 (GLOVE) ×6 IMPLANT
GLOVE SURG POLYISO LF SZ7 (GLOVE) ×1 IMPLANT
GOWN STRL REUS W/TWL LRG LVL3 (GOWN DISPOSABLE) ×4 IMPLANT
HOLDER FOLEY CATH W/STRAP (MISCELLANEOUS) ×3 IMPLANT
IV NS 1000ML (IV SOLUTION) ×3
IV NS 1000ML BAXH (IV SOLUTION) ×2 IMPLANT
IV SET EXTENSION GRAVITY 40 LF (IV SETS) ×3 IMPLANT
KIT TURNOVER CYSTO (KITS) ×3 IMPLANT
MAT PREVALON FULL STRYKER (MISCELLANEOUS) ×3 IMPLANT
NS IRRIG 500ML POUR BTL (IV SOLUTION) ×1 IMPLANT
PACK VAGINAL MINOR WOMEN LF (CUSTOM PROCEDURE TRAY) ×3 IMPLANT
PAD DRESSING TELFA 3X8 NADH (GAUZE/BANDAGES/DRESSINGS) ×2 IMPLANT
TOWEL OR 17X26 10 PK STRL BLUE (TOWEL DISPOSABLE) ×3 IMPLANT
TRAY FOLEY W/BAG SLVR 14FR LF (SET/KITS/TRAYS/PACK) ×3 IMPLANT

## 2021-11-18 NOTE — Interval H&P Note (Signed)
History and Physical Interval Note: ? ?11/18/2021 ?7:29 AM ? ?Taylor Buchanan  has presented today for surgery, with the diagnosis of CERVICAL CANCER.  The various methods of treatment have been discussed with the patient and family. After consideration of risks, benefits and other options for treatment, the patient has consented to  Procedure(s): ?TANDEM RING INSERTION (N/A) ?OPERATIVE ULTRASOUND (N/A) as a surgical intervention.  The patient's history has been reviewed, patient examined, no change in status, stable for surgery.  I have reviewed the patient's chart and labs.  Questions were answered to the patient's satisfaction.   ? ? ?Gery Pray ? ? ?

## 2021-11-18 NOTE — Anesthesia Postprocedure Evaluation (Signed)
Anesthesia Post Note ? ?Patient: Taylor Buchanan ? ?Procedure(s) Performed: TANDEM RING INSERTION (Vagina ) ?OPERATIVE ULTRASOUND (Abdomen) ? ?  ? ?Patient location during evaluation: PACU ?Anesthesia Type: General ?Level of consciousness: awake and alert, oriented and patient cooperative ?Pain management: pain level controlled ?Vital Signs Assessment: post-procedure vital signs reviewed and stable ?Respiratory status: spontaneous breathing, nonlabored ventilation and respiratory function stable ?Cardiovascular status: blood pressure returned to baseline and stable ?Postop Assessment: no apparent nausea or vomiting ?Anesthetic complications: no ? ? ?No notable events documented. ? ?Last Vitals:  ?Vitals:  ? 11/18/21 0811 11/18/21 0815  ?BP: (!) 102/58 (!) 100/59  ?Pulse: 76 69  ?Resp: 19 19  ?Temp: 36.5 ?C   ?SpO2: 93% 93%  ?  ?Last Pain:  ?Vitals:  ? 11/18/21 0815  ?TempSrc:   ?PainSc: Asleep  ? ? ?  ?  ?  ?  ?  ?  ? ?Jarome Matin [name] ? ? ? ? ?

## 2021-11-18 NOTE — Op Note (Signed)
11/18/2021 ? ?8:35 AM ? ?PATIENT:  Taylor Buchanan  66 y.o. female ? ?PRE-OPERATIVE DIAGNOSIS:  CERVICAL CANCER ? ?POST-OPERATIVE DIAGNOSIS:  CERVICAL CANCER ? ?PROCEDURE:  Procedure(s): ?TANDEM RING INSERTION (N/A) ?OPERATIVE ULTRASOUND (N/A) ? ?SURGEON:  Surgeon(s) and Role: ?   Gery Pray, MD - Primary ? ?PHYSICIAN ASSISTANT:  ? ?ASSISTANTS: none  ? ?ANESTHESIA:   general ? ?EBL:  0 mL  ? ?BLOOD ADMINISTERED:none ? ?DRAINS: Urinary Catheter (Foley)  ? ?LOCAL MEDICATIONS USED:  NONE ? ?SPECIMEN:  No Specimen ? ?DISPOSITION OF SPECIMEN:  N/A ? ?COUNTS:  YES ? ?TOURNIQUET:  * No tourniquets in log * ? ?DICTATION: The patient was prepped and draped in usual sterile fashion and placed in the dorsolithotomy position.  Timeout for the procedure, preoperative medications and antibiotics was performed.  The bladder was backfilled with approximately 200 cc of sterile water for ultrasound imaging purposes. the patient proceeded to undergo exam under anesthesia.  There was erythema throughout the cervical region.  On bimanual and rectovaginal examination the cervix was firm but no extent into the parametrial tissues. excellent images on transabdominal ultrasound were obtained.   The uterus was anteverted.  The cervical sleeve placed last week remained in good position. the 40 mm tandem covered by a cervical sleeve was placed within the endometrial cavity.  Ultrasound images showed good placement of the 45 degree , 40 mm tandem within the upper three quarters of the uterus.  She then proceeded to have placement of a 45 degree cervical ring with a small shielding cap in place.  It was then followed by placement of a rectal paddle posteriorly to limit dose to the rectal mucosal.  No room for additional packing.  She tolerated the procedure well.  Patient was subsequently taken to the recovery room in stable condition.  Later in the day the patient will be taken to radiation oncology for simulation, planning and her fourth  high-dose-rate treatment.  She is to receive 5.5 Pearline Cables to the high risk clinical target volume.  Iridium 192 will be the high-dose-rate source. ? ?PLAN OF CARE:  Transfer to radiation oncology for planning and treatment ? ?PATIENT DISPOSITION:  PACU - hemodynamically stable. ?  ?Delay start of Pharmacological VTE agent (>24hrs) due to surgical blood loss or risk of bleeding: not applicable ? ?

## 2021-11-18 NOTE — Patient Instructions (Signed)
IMMEDIATELY FOLLOWING SURGERY: Do not drive or operate machinery for the first twenty four hours after surgery. Do not make any important decisions for twenty four hours after surgery or while taking narcotic pain medications or sedatives. If you develop intractable nausea and vomiting or a severe headache please notify your doctor immediately.  ? ?FOLLOW-UP: You do not need to follow up with anesthesia unless specifically instructed to do so.  ? ?WOUND CARE INSTRUCTIONS (if applicable): Expect some mild vaginal bleeding, but if large amount of bleeding occurs please contact Dr. Sondra Come at [phone] or the Radiation On-Call physician. Call for any fever greater than 101.0 degrees or increasing vaginal//abdominal pain or trouble urinating.  ? ?QUESTIONS?: Please feel free to call your physician or the hospital operator if you have any questions, and they will be happy to assist you. Resume all medications: as listed on your after visit summary. Your next appointment is: ? ?Future Appointments  ?Date Time Provider [place]  ?11/22/2021 10:30 AM WL-US 2 WL-US [place]  ?11/22/2021  1:30 PM Gery Pray, MD [facility] None  ?11/22/2021  3:00 PM Gery Pray, MD [facility] None  ?12/12/2021  9:45 AM [facility] [place] [facility] None  ?12/12/2021 10:20 AM Heath Lark, MD [facility] None  ?01/25/2022 11:00 AM Leamon Arnt, MD [facility]  ? ? ?  ?

## 2021-11-18 NOTE — Transfer of Care (Signed)
Immediate Anesthesia Transfer of Care Note ? ?Patient: Taylor Buchanan ? ?Procedure(s) Performed: Procedure(s) (LRB): ?TANDEM RING INSERTION (N/A) ?OPERATIVE ULTRASOUND (N/A) ? ?Patient Location: PACU ? ?Anesthesia Type: General ? ?Level of Consciousness: awake, oriented, sedated and patient cooperative ? ?Airway & Oxygen Therapy: Patient Spontanous Breathing and Patient connected to face mask oxygen ? ?Post-op Assessment: Report given to PACU RN and Post -op Vital signs reviewed and stable ? ?Post vital signs: Reviewed and stable ? ?Complications: No apparent anesthesia complications ? ?Last Vitals:  ?Vitals Value Taken Time  ?BP 102/58 11/18/21 0811  ?Temp    ?Pulse 69 11/18/21 0815  ?Resp 19 11/18/21 0815  ?SpO2 93 % 11/18/21 0815  ?Vitals shown include unvalidated device data. ? ?Last Pain:  ?Vitals:  ? 11/18/21 0615  ?TempSrc: Oral  ?PainSc: 0-No pain  ?   ? ?Patients Stated Pain Goal: 5 (11/18/21 0615) ? ?Complications: No notable events documented. ?

## 2021-11-18 NOTE — Anesthesia Procedure Notes (Signed)
Procedure Name: LMA Insertion ?Date/Time: 11/18/2021 7:35 AM ?Performed by: Suan Halter, CRNA ?Pre-anesthesia Checklist: Patient identified, Emergency Drugs available, Suction available and Patient being monitored ?Patient Re-evaluated:Patient Re-evaluated prior to induction ?Oxygen Delivery Method: Circle system utilized ?Preoxygenation: Pre-oxygenation with 100% oxygen ?Induction Type: IV induction ?Ventilation: Mask ventilation without difficulty ?LMA: LMA inserted ?LMA Size: 4.0 ?Number of attempts: 1 ?Airway Equipment and Method: Bite block ?Placement Confirmation: positive ETCO2 ?Tube secured with: Tape ?Dental Injury: Teeth and Oropharynx as per pre-operative assessment  ? ? ? ? ?

## 2021-11-21 ENCOUNTER — Encounter (HOSPITAL_BASED_OUTPATIENT_CLINIC_OR_DEPARTMENT_OTHER): Payer: Self-pay | Admitting: Radiation Oncology

## 2021-11-21 ENCOUNTER — Other Ambulatory Visit: Payer: Self-pay

## 2021-11-21 NOTE — Progress Notes (Signed)
Spoke w/ via phone for pre-op interview---pt ?Lab needs dos---- none            ?Lab results------cbc with dif bmp 11-04-2021 epic ?COVID test -----patient states asymptomatic no test needed ?Arrive at -------845 am 11-22-2021 ?NPO after MN NO Solid Food.  Clear liquids from MN until---745 am ?Med rec completed ?Medications to take morning of surgery ----- Lexapro and Protonix ?Diabetic medication -----n/aolish to be worn day of surgery ?Patient instructed to bring photo id and insurance card day of surgery ?Patient aware to have Driver (ride ) / caregiver    husband for 24 hours after surgery  ?Patient Special Instructions -----na ?Pre-Op special Istructions -----na ?Patient verbalized understanding of instructions that were given at this phone interview. ?Patient denies shortness of breath, chest pain, fever, cough at this phone interview.  ?

## 2021-11-21 NOTE — Progress Notes (Signed)
?  Radiation Oncology         [phone] ?________________________________ ? ?Name: Taylor Buchanan MRN: [id number]  ?Date: 11/22/2021  DOB: [birth year] ? ?CC: Leamon Arnt, MD  Lafonda Mosses, MD ? ?HDR BRACHYTHERAPY NOTE ? ?DIAGNOSIS: The encounter diagnosis was Malignant neoplasm of cervix, unspecified site [facility]). ?  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 ?NARRATIVE: ?The patient was brought to the [place] suite. Identity was confirmed. All relevant records and images related to the planned course of therapy were reviewed. The patient freely provided informed written consent to proceed with treatment after reviewing the details related to the planned course of therapy. The consent form was witnessed and verified by the simulation staff. Then, the patient was set-up in a stable reproducible supine position for radiation therapy. The tandem ring system was accessed and fiducial markers were placed within the tandem and ring.  ? ?Simple treatment device note: ?On the operating room the patient had construction of her custom tandem ring system. She will be treated with a 45? tandem/ring system. The patient had placement of a 40 mm tandem. A cervical ring with a small shielding was used for her treatment. A rectal paddle was also part of her custom set up device. ? ?Verification simulation note: ?An AP and lateral film was obtained through the pelvis area. This was compared to the patient's planning films documenting accurate position of the tandem/ring system for treatment. ? ?High-dose-rate brachytherapy treatment note:   ?The remote afterloading device was accessed through catheter system and attached to the tandem ring system. Patient then proceeded to undergo her fifth high-dose-rate treatment directed at the cervix. The patient was prescribed a dose of 5.5 gray to be delivered to the [place]. Patient was treated with 2 channels using 22 dwell positions. Treatment time was 436.2 seconds. The  patient tolerated the procedure well. After completion of her therapy, a radiation survey was performed documenting return of the iridium source into the GammaMed safe. The patient was then transferred to the nursing suite.  She then had removal of the rectal paddle followed by the tandem and ring system. The patient tolerated the removal well. ? ?PLAN: The patient has completed her fifth and final high-dose-rate treatment. She will return in one month for routine follow-up. She tolerated the brachytherapy portion of her treatment very well. ? ? ?________________________________   ? ?----------------------------------- ? ?Blair Promise, PhD, MD ? ?This document serves as a record of services personally performed by Gery Pray, MD. It was created on his behalf by Roney Mans, a trained medical scribe. The creation of this record is based on the scribe's personal observations and the provider's statements to them. This document has been checked and approved by the attending provider. ? ? ?

## 2021-11-22 ENCOUNTER — Ambulatory Visit (HOSPITAL_BASED_OUTPATIENT_CLINIC_OR_DEPARTMENT_OTHER)
Admission: RE | Admit: 2021-11-22 | Discharge: 2021-11-22 | Disposition: A | Discharge status: Other Acute Inpatient Hospital | Payer: Medicare Other | Source: Ambulatory Visit | Attending: Radiation Oncology | Admitting: Radiation Oncology

## 2021-11-22 ENCOUNTER — Encounter (HOSPITAL_BASED_OUTPATIENT_CLINIC_OR_DEPARTMENT_OTHER): Payer: Self-pay | Admitting: Radiation Oncology

## 2021-11-22 ENCOUNTER — Encounter (HOSPITAL_BASED_OUTPATIENT_CLINIC_OR_DEPARTMENT_OTHER)
Admission: RE | Disposition: A | Discharge status: Other Acute Inpatient Hospital | Payer: Self-pay | Source: Ambulatory Visit | Attending: Radiation Oncology

## 2021-11-22 ENCOUNTER — Ambulatory Visit (HOSPITAL_BASED_OUTPATIENT_CLINIC_OR_DEPARTMENT_OTHER): Payer: Medicare Other | Admitting: Certified Registered"

## 2021-11-22 ENCOUNTER — Other Ambulatory Visit: Payer: Self-pay

## 2021-11-22 ENCOUNTER — Ambulatory Visit
Admission: RE | Admit: 2021-11-22 | Discharge: 2021-11-22 | Disposition: A | Discharge status: Home / Self Care | Payer: Medicare Other | Source: Ambulatory Visit | Attending: Radiation Oncology | Admitting: Radiation Oncology

## 2021-11-22 ENCOUNTER — Encounter: Payer: Self-pay | Admitting: Radiation Oncology

## 2021-11-22 ENCOUNTER — Ambulatory Visit (HOSPITAL_COMMUNITY)
Admission: RE | Admit: 2021-11-22 | Discharge: 2021-11-22 | Disposition: A | Discharge status: Home / Self Care | Payer: Medicare Other | Source: Ambulatory Visit | Attending: Radiation Oncology | Admitting: Radiation Oncology

## 2021-11-22 ENCOUNTER — Encounter: Payer: Self-pay | Admitting: Hematology and Oncology

## 2021-11-22 VITALS — BP 117/68 | HR 61 | Temp 97.6°F | Resp 18

## 2021-11-22 DIAGNOSIS — N950 Postmenopausal bleeding: Secondary | ICD-10-CM | POA: Diagnosis not present

## 2021-11-22 DIAGNOSIS — C539 Malignant neoplasm of cervix uteri, unspecified: Secondary | ICD-10-CM

## 2021-11-22 DIAGNOSIS — Z923 Personal history of irradiation: Secondary | ICD-10-CM | POA: Diagnosis not present

## 2021-11-22 DIAGNOSIS — Z8673 Personal history of transient ischemic attack (TIA), and cerebral infarction without residual deficits: Secondary | ICD-10-CM | POA: Insufficient documentation

## 2021-11-22 DIAGNOSIS — Z9221 Personal history of antineoplastic chemotherapy: Secondary | ICD-10-CM | POA: Diagnosis not present

## 2021-11-22 DIAGNOSIS — K219 Gastro-esophageal reflux disease without esophagitis: Secondary | ICD-10-CM | POA: Insufficient documentation

## 2021-11-22 HISTORY — PX: TANDEM RING INSERTION: SHX6199

## 2021-11-22 HISTORY — PX: OPERATIVE ULTRASOUND: SHX5996

## 2021-11-22 SURGERY — INSERTION, UTERINE TANDEM AND RING OR CYLINDER, FOR BRACHYTHERAPY
Anesthesia: General | Site: Cervix

## 2021-11-22 MED ORDER — KETOROLAC TROMETHAMINE 30 MG/ML IJ SOLN
INTRAMUSCULAR | Status: AC
  Filled 2021-11-22: qty 1

## 2021-11-22 MED ORDER — ONDANSETRON HCL 4 MG/2ML IJ SOLN
INTRAMUSCULAR | Status: DC | PRN
  Administered 2021-11-22: 4 mg via INTRAVENOUS

## 2021-11-22 MED ORDER — FENTANYL CITRATE (PF) 100 MCG/2ML IJ SOLN
INTRAMUSCULAR | Status: DC | PRN
  Administered 2021-11-22: 50 ug via INTRAVENOUS

## 2021-11-22 MED ORDER — SODIUM CHLORIDE 0.9 % IR SOLN
Status: DC | PRN
  Administered 2021-11-22: 200 mL

## 2021-11-22 MED ORDER — CHLORHEXIDINE GLUCONATE CLOTH 2 % EX PADS: 6.0000 | MEDICATED_PAD | Freq: Every day | CUTANEOUS | Status: DC

## 2021-11-22 MED ORDER — 0.9 % SODIUM CHLORIDE (POUR BTL) OPTIME
TOPICAL | Status: DC | PRN
  Administered 2021-11-22: 500 mL

## 2021-11-22 MED ORDER — SODIUM CHLORIDE 0.9% FLUSH: 10.0000 mL | INTRAVENOUS | Status: DC | PRN

## 2021-11-22 MED ORDER — DEXAMETHASONE SODIUM PHOSPHATE 10 MG/ML IJ SOLN
INTRAMUSCULAR | Status: DC | PRN
  Administered 2021-11-22: 5 mg via INTRAVENOUS

## 2021-11-22 MED ORDER — KETOROLAC TROMETHAMINE 30 MG/ML IJ SOLN
INTRAMUSCULAR | Status: DC | PRN
  Administered 2021-11-22: 30 mg via INTRAVENOUS

## 2021-11-22 MED ORDER — ONDANSETRON HCL 4 MG/2ML IJ SOLN
INTRAMUSCULAR | Status: AC
  Filled 2021-11-22: qty 2

## 2021-11-22 MED ORDER — LACTATED RINGERS IV SOLN: INTRAVENOUS | Status: DC

## 2021-11-22 MED ORDER — LACTATED RINGERS IV SOLN
INTRAVENOUS | Status: DC
  Filled 2021-11-22 (×2): qty 250

## 2021-11-22 MED ORDER — SODIUM CHLORIDE 0.9% FLUSH
10.0000 mL | Freq: Once | INTRAVENOUS | Status: AC
  Administered 2021-11-22: 10 mL via INTRAVENOUS

## 2021-11-22 MED ORDER — DEXAMETHASONE SODIUM PHOSPHATE 10 MG/ML IJ SOLN
INTRAMUSCULAR | Status: AC
  Filled 2021-11-22: qty 1

## 2021-11-22 MED ORDER — AMISULPRIDE (ANTIEMETIC) 5 MG/2ML IV SOLN: 10.0000 mg | Freq: Once | INTRAVENOUS | Status: DC | PRN

## 2021-11-22 MED ORDER — PROPOFOL 10 MG/ML IV BOLUS
INTRAVENOUS | Status: DC | PRN
Start: 2021-11-22 — End: 2021-11-22
  Administered 2021-11-22: 150 mg via INTRAVENOUS

## 2021-11-22 MED ORDER — FENTANYL CITRATE (PF) 100 MCG/2ML IJ SOLN: 25.0000 ug | INTRAMUSCULAR | Status: DC | PRN

## 2021-11-22 MED ORDER — ACETAMINOPHEN 500 MG PO TABS
1000.0000 mg | ORAL_TABLET | Freq: Once | ORAL | Status: AC
  Administered 2021-11-22: 1000 mg via ORAL

## 2021-11-22 MED ORDER — FENTANYL CITRATE (PF) 100 MCG/2ML IJ SOLN
INTRAMUSCULAR | Status: AC
  Filled 2021-11-22: qty 2

## 2021-11-22 MED ORDER — SODIUM CHLORIDE 0.9% FLUSH: 10.0000 mL | Freq: Two times a day (BID) | INTRAVENOUS | Status: DC

## 2021-11-22 MED ORDER — MIDAZOLAM HCL 2 MG/2ML IJ SOLN
INTRAMUSCULAR | Status: AC
  Filled 2021-11-22: qty 2

## 2021-11-22 MED ORDER — MIDAZOLAM HCL 2 MG/2ML IJ SOLN
INTRAMUSCULAR | Status: DC | PRN
  Administered 2021-11-22: 1 mg via INTRAVENOUS
  Administered 2021-11-22 (×2): .5 mg via INTRAVENOUS

## 2021-11-22 MED ORDER — ACETAMINOPHEN 500 MG PO TABS
ORAL_TABLET | ORAL | Status: AC
  Filled 2021-11-22: qty 2

## 2021-11-22 MED ORDER — HEPARIN SOD (PORK) LOCK FLUSH 100 UNIT/ML IV SOLN
500.0000 [IU] | Freq: Once | INTRAVENOUS | Status: AC
  Administered 2021-11-22: 500 [IU] via INTRAVENOUS

## 2021-11-22 MED ORDER — LIDOCAINE HCL (PF) 2 % IJ SOLN
INTRAMUSCULAR | Status: AC
  Filled 2021-11-22: qty 5

## 2021-11-22 MED ORDER — POVIDONE-IODINE 10 % EX SWAB: 2.0000 "application " | Freq: Once | CUTANEOUS | Status: DC

## 2021-11-22 SURGICAL SUPPLY — 20 items
BNDG CONFORM 2 STRL LF (GAUZE/BANDAGES/DRESSINGS) IMPLANT
COVER SURGICAL LIGHT HANDLE (MISCELLANEOUS) ×1 IMPLANT
DILATOR CANAL MILEX (MISCELLANEOUS) IMPLANT
DRSG PAD ABDOMINAL 8X10 ST (GAUZE/BANDAGES/DRESSINGS) ×3 IMPLANT
DRSG TELFA 3X8 NADH (GAUZE/BANDAGES/DRESSINGS) ×3 IMPLANT
GAUZE 4X4 16PLY ~~LOC~~+RFID DBL (SPONGE) ×5 IMPLANT
GLOVE SURG ENC MOIS LTX SZ7.5 (GLOVE) ×6 IMPLANT
GOWN STRL REUS W/TWL LRG LVL3 (GOWN DISPOSABLE) ×3 IMPLANT
HOLDER FOLEY CATH W/STRAP (MISCELLANEOUS) ×3 IMPLANT
IV NS 1000ML (IV SOLUTION) ×3
IV NS 1000ML BAXH (IV SOLUTION) ×2 IMPLANT
IV SET EXTENSION GRAVITY 40 LF (IV SETS) ×3 IMPLANT
KIT TURNOVER CYSTO (KITS) ×3 IMPLANT
MAT PREVALON FULL STRYKER (MISCELLANEOUS) ×3 IMPLANT
PACK VAGINAL MINOR WOMEN LF (CUSTOM PROCEDURE TRAY) ×3 IMPLANT
PACKING VAGINAL (PACKING) IMPLANT
PAD DRESSING TELFA 3X8 NADH (GAUZE/BANDAGES/DRESSINGS) ×2 IMPLANT
TOWEL OR 17X26 10 PK STRL BLUE (TOWEL DISPOSABLE) ×3 IMPLANT
TRAY FOLEY W/BAG SLVR 14FR LF (SET/KITS/TRAYS/PACK) ×3 IMPLANT
WATER STERILE IRR 500ML POUR (IV SOLUTION) ×3 IMPLANT

## 2021-11-22 NOTE — Anesthesia Procedure Notes (Signed)
Procedure Name: LMA Insertion ?Date/Time: 11/22/2021 10:50 AM ?Performed by: Suan Halter, CRNA ?Pre-anesthesia Checklist: Patient identified, Emergency Drugs available, Suction available and Patient being monitored ?Patient Re-evaluated:Patient Re-evaluated prior to induction ?Oxygen Delivery Method: Circle system utilized ?Preoxygenation: Pre-oxygenation with 100% oxygen ?Induction Type: IV induction ?Ventilation: Mask ventilation without difficulty ?LMA: LMA inserted ?LMA Size: 4.0 ?Number of attempts: 1 ?Airway Equipment and Method: Bite block ?Placement Confirmation: positive ETCO2 ?Tube secured with: Tape ?Dental Injury: Teeth and Oropharynx as per pre-operative assessment  ? ? ? ? ?

## 2021-11-22 NOTE — Interval H&P Note (Signed)
History and Physical Interval Note: ? ?11/22/2021 ?10:40 AM ? ?Taylor Buchanan  has presented today for surgery, with the diagnosis of CERVICAL CANCER.  The various methods of treatment have been discussed with the patient and family. After consideration of risks, benefits and other options for treatment, the patient has consented to  Procedure(s): ?TANDEM RING INSERTION (N/A) ?OPERATIVE ULTRASOUND (N/A) as a surgical intervention.  The patient's history has been reviewed, patient examined, no change in status, stable for surgery.  I have reviewed the patient's chart and labs.  Questions were answered to the patient's satisfaction.   ? ? ?Gery Pray ? ? ?

## 2021-11-22 NOTE — Patient Instructions (Signed)
IMMEDIATELY FOLLOWING SURGERY: Do not drive or operate machinery for the first twenty four hours after surgery. Do not make any important decisions for twenty four hours after surgery or while taking narcotic pain medications or sedatives. If you develop intractable nausea and vomiting or a severe headache please notify your doctor immediately.  ? ?FOLLOW-UP: You do not need to follow up with anesthesia unless specifically instructed to do so.  ? ?WOUND CARE INSTRUCTIONS (if applicable): Expect some mild vaginal bleeding, but if large amount of bleeding occurs please contact Dr. Sondra Come at [phone] or the Radiation On-Call physician. Call for any fever greater than 101.0 degrees or increasing vaginal//abdominal pain or trouble urinating.  ? ?QUESTIONS?: Please feel free to call your physician or the hospital operator if you have any questions, and they will be happy to assist you. Resume all medications: as listed on your after visit summary. Your next appointment is: ? ?Future Appointments  ?Date Time Provider [place]  ?11/22/2021  3:30 PM Gery Pray, MD [facility] None  ?12/12/2021  9:45 AM [facility] [place] [facility] None  ?12/12/2021 10:20 AM Heath Lark, MD [facility] None  ?12/22/2021 10:45 AM Gery Pray, MD [facility] None  ?01/25/2022 11:00 AM Leamon Arnt, MD [facility]  ? ? ?  ?

## 2021-11-22 NOTE — Op Note (Signed)
11/22/2021 ? ?11:41 AM ? ?PATIENT:  Taylor Buchanan  66 y.o. female ? ?PRE-OPERATIVE DIAGNOSIS:  CERVICAL CANCER ? ?POST-OPERATIVE DIAGNOSIS:  CERVICAL CANCER ? ?PROCEDURE:  Procedure(s): ?TANDEM RING INSERTION (N/A) ?OPERATIVE ULTRASOUND (N/A) ? ?SURGEON:  Surgeon(s) and Role: ?   Gery Pray, MD - Primary ? ?PHYSICIAN ASSISTANT:  ? ?ASSISTANTS: none  ? ?ANESTHESIA:   general ? ?EBL:  0 mL  ? ?BLOOD ADMINISTERED:none ? ?DRAINS: Urinary Catheter (Foley)  ? ?LOCAL MEDICATIONS USED:  NONE ? ?SPECIMEN:  No Specimen ? ?DISPOSITION OF SPECIMEN:  N/A ? ?COUNTS:  YES ? ?TOURNIQUET:  * No tourniquets in log * ? ?DICTATION: The patient was prepped and draped in usual sterile fashion and placed in the dorsolithotomy position.  Timeout for the procedure, preoperative medications and antibiotics was performed.  The bladder was backfilled with approximately 200 cc of sterile water for ultrasound imaging purposes. the patient proceeded to undergo exam under anesthesia.  There was erythema throughout the cervical region.  On bimanual and rectovaginal examination the cervix was firm but no extent into the parametrial tissues. excellent images on transabdominal ultrasound were obtained.   The uterus was anteverted.  She proceeded to undergo sounding of the uterus.  The uterus was estimated to be 6.7 cm on ultrasound imaging.  She then had dilation of the cervical os. the 40 mm tandem covered by a cervical sleeve was placed within the endometrial cavity.  Ultrasound images showed good placement of the 45 degree , 40 mm tandem within the upper three quarters of the uterus.  She then proceeded to have placement of a 45 degree cervical ring with a small shielding cap in place.  It was then followed by placement of a rectal paddle posteriorly to limit dose to the rectal mucosal.  No room for additional packing.  She tolerated the procedure well.  She was subsequently taken to the recovery room in stable condition.  Later in the day the  patient will be taken to radiation oncology for simulation, planning and her fifth high-dose-rate treatment.  She is to receive 5.5 Pearline Cables to the high risk clinical target volume.  Iridium 192 will be the high-dose-rate source.  This is the last high-dose-rate treatment for the patient.  This will complete her planned course of definitive radiation therapy. ? ?PLAN OF CARE:  Transfer to radiation oncology for planning and treatment ? ?PATIENT DISPOSITION:  PACU - hemodynamically stable. ?  ?Delay start of Pharmacological VTE agent (>24hrs) due to surgical blood loss or risk of bleeding: not applicable ? ?

## 2021-11-22 NOTE — Anesthesia Preprocedure Evaluation (Signed)
Anesthesia Evaluation  ?Patient identified by MRN, date of birth, ID band ?Patient awake ? ? ? ?Reviewed: ?Allergy & Precautions, NPO status , Patient's Chart, lab work & pertinent test results ? ?Airway ?Mallampati: II ? ?TM Distance: >3 FB ?Neck ROM: Full ? ? ? Dental ? ?(+) Dental Advisory Given ?  ?Pulmonary ?former smoker,  ?  ?breath sounds clear to auscultation ? ? ? ? ? ? Cardiovascular ?negative cardio ROS ? ? ?Rhythm:Regular Rate:Normal ? ? ?  ?Neuro/Psych ?TIA  ? GI/Hepatic ?Neg liver ROS, GERD  ,  ?Endo/Other  ?negative endocrine ROS ? Renal/GU ?negative Renal ROS  ? ?  ?Musculoskeletal ? ? Abdominal ?  ?Peds ? Hematology ?negative hematology ROS ?(+)   ?Anesthesia Other Findings ? ? Reproductive/Obstetrics ? ?  ? ? ? ? ? ? ? ? ? ? ? ? ? ?  ?  ? ? ? ? ? ? ? ? ?Lab Results  ?Component Value Date  ? WBC 5.6 11/04/2021  ? HGB 8.6 (L) 11/04/2021  ? HCT 25.7 (L) 11/04/2021  ? MCV 100.0 11/04/2021  ? PLT 416 (H) 11/04/2021  ? ?Lab Results  ?Component Value Date  ? CREATININE 0.76 11/04/2021  ? BUN 18 11/04/2021  ? NA 138 11/04/2021  ? K 3.5 11/04/2021  ? CL 106 11/04/2021  ? CO2 25 11/04/2021  ? ? ?Anesthesia Physical ?Anesthesia Plan ? ?ASA: 2 ? ?Anesthesia Plan: General  ? ?Post-op Pain Management: Tylenol PO (pre-op)*, Toradol IV (intra-op)* and Minimal or no pain anticipated  ? ?Induction: Intravenous ? ?PONV Risk Score and Plan: 3 and Ondansetron, Dexamethasone and Treatment may vary due to age or medical condition ? ?Airway Management Planned: LMA ? ?Additional Equipment: None ? ?Intra-op Plan:  ? ?Post-operative Plan: Extubation in OR ? ?Informed Consent: I have reviewed the patients History and Physical, chart, labs and discussed the procedure including the risks, benefits and alternatives for the proposed anesthesia with the patient or authorized representative who has indicated his/her understanding and acceptance.  ? ? ? ?Dental advisory given ? ?Plan Discussed  with: CRNA ? ?Anesthesia Plan Comments:   ? ? ? ? ? ? ?Anesthesia Quick Evaluation ? ?

## 2021-11-22 NOTE — Anesthesia Postprocedure Evaluation (Signed)
Anesthesia Post Note ? ?Patient: Taylor Buchanan ? ?Procedure(s) Performed: TANDEM RING INSERTION (Cervix) ?OPERATIVE ULTRASOUND (Abdomen) ? ?  ? ?Patient location during evaluation: PACU ?Anesthesia Type: General ?Level of consciousness: awake and alert ?Pain management: pain level controlled ?Vital Signs Assessment: post-procedure vital signs reviewed and stable ?Respiratory status: spontaneous breathing, nonlabored ventilation, respiratory function stable and patient connected to nasal cannula oxygen ?Cardiovascular status: blood pressure returned to baseline and stable ?Postop Assessment: no apparent nausea or vomiting ?Anesthetic complications: no ? ? ?No notable events documented. ? ?Last Vitals:  ?Vitals:  ? 11/22/21 1145 11/22/21 1200  ?BP: 129/69 116/73  ?Pulse: 67 68  ?Resp: (!) 24 16  ?Temp:    ?SpO2: 98% 92%  ?  ?Last Pain:  ?Vitals:  ? 11/22/21 1130  ?TempSrc:   ?PainSc: 0-No pain  ? ? ?  ?  ?  ?  ?  ?  ? ?Suzette Battiest E ? ? ? ? ?

## 2021-11-22 NOTE — Transfer of Care (Signed)
Immediate Anesthesia Transfer of Care Note ? ?Patient: Taylor Buchanan ? ?Procedure(s) Performed: Procedure(s) (LRB): ?TANDEM RING INSERTION (N/A) ?OPERATIVE ULTRASOUND (N/A) ? ?Patient Location: PACU ? ?Anesthesia Type: General ? ?Level of Consciousness: awake, oriented, sedated and patient cooperative ? ?Airway & Oxygen Therapy: Patient Spontanous Breathing and Patient connected to face mask oxygen ? ?Post-op Assessment: Report given to PACU RN and Post -op Vital signs reviewed and stable ? ?Post vital signs: Reviewed and stable ? ?Complications: No apparent anesthesia complications ? ?Last Vitals:  ?Vitals Value Taken Time  ?BP 122/71 11/22/21 1127  ?Temp    ?Pulse 66 11/22/21 1130  ?Resp 10 11/22/21 1130  ?SpO2 100 % 11/22/21 1130  ?Vitals shown include unvalidated device data. ? ?Last Pain:  ?Vitals:  ? 11/22/21 0913  ?TempSrc: Oral  ?PainSc: 0-No pain  ?   ? ?Patients Stated Pain Goal: 5 (11/22/21 0913) ? ?Complications: No notable events documented. ?

## 2021-11-23 ENCOUNTER — Encounter (HOSPITAL_BASED_OUTPATIENT_CLINIC_OR_DEPARTMENT_OTHER): Payer: Self-pay | Admitting: Radiation Oncology

## 2021-12-12 ENCOUNTER — Other Ambulatory Visit: Payer: Self-pay

## 2021-12-12 ENCOUNTER — Inpatient Hospital Stay: Payer: Medicare Other | Admitting: Hematology and Oncology

## 2021-12-12 ENCOUNTER — Encounter: Payer: Self-pay | Admitting: Hematology and Oncology

## 2021-12-12 ENCOUNTER — Inpatient Hospital Stay: Payer: Medicare Other | Attending: Gynecologic Oncology

## 2021-12-12 VITALS — BP 121/77 | HR 64 | Temp 98.1°F | Resp 18 | Ht 66.5 in | Wt 170.6 lb

## 2021-12-12 DIAGNOSIS — D539 Nutritional anemia, unspecified: Secondary | ICD-10-CM

## 2021-12-12 DIAGNOSIS — Z7982 Long term (current) use of aspirin: Secondary | ICD-10-CM | POA: Diagnosis not present

## 2021-12-12 DIAGNOSIS — C539 Malignant neoplasm of cervix uteri, unspecified: Secondary | ICD-10-CM

## 2021-12-12 DIAGNOSIS — Z923 Personal history of irradiation: Secondary | ICD-10-CM | POA: Diagnosis not present

## 2021-12-12 DIAGNOSIS — Z79899 Other long term (current) drug therapy: Secondary | ICD-10-CM | POA: Insufficient documentation

## 2021-12-12 DIAGNOSIS — Z8673 Personal history of transient ischemic attack (TIA), and cerebral infarction without residual deficits: Secondary | ICD-10-CM | POA: Diagnosis not present

## 2021-12-12 LAB — CBC WITH DIFFERENTIAL (CANCER CENTER ONLY)
Abs Immature Granulocytes: 0.01 10*3/uL (ref 0.00–0.07)
Basophils Absolute: 0 10*3/uL (ref 0.0–0.1)
Basophils Relative: 1 %
Eosinophils Absolute: 0.3 10*3/uL (ref 0.0–0.5)
Eosinophils Relative: 7 %
HCT: 34.6 % — ABNORMAL LOW (ref 36.0–46.0)
Hemoglobin: 11.8 g/dL — ABNORMAL LOW (ref 12.0–15.0)
Immature Granulocytes: 0 %
Lymphocytes Relative: 14 %
Lymphs Abs: 0.6 10*3/uL — ABNORMAL LOW (ref 0.7–4.0)
MCH: 35.4 pg — ABNORMAL HIGH (ref 26.0–34.0)
MCHC: 34.1 g/dL (ref 30.0–36.0)
MCV: 103.9 fL — ABNORMAL HIGH (ref 80.0–100.0)
Monocytes Absolute: 0.4 10*3/uL (ref 0.1–1.0)
Monocytes Relative: 10 %
Neutro Abs: 2.8 10*3/uL (ref 1.7–7.7)
Neutrophils Relative %: 68 %
Platelet Count: 266 10*3/uL (ref 150–400)
RBC: 3.33 MIL/uL — ABNORMAL LOW (ref 3.87–5.11)
RDW: 16.8 % — ABNORMAL HIGH (ref 11.5–15.5)
WBC Count: 4.2 10*3/uL (ref 4.0–10.5)
nRBC: 0 % (ref 0.0–0.2)

## 2021-12-12 LAB — BASIC METABOLIC PANEL - CANCER CENTER ONLY
Anion gap: 4 — ABNORMAL LOW (ref 5–15)
BUN: 22 mg/dL (ref 8–23)
CO2: 29 mmol/L (ref 22–32)
Calcium: 9.5 mg/dL (ref 8.9–10.3)
Chloride: 104 mmol/L (ref 98–111)
Creatinine: 0.9 mg/dL (ref 0.44–1.00)
GFR, Estimated: >60 mL/min (ref >60)
Glucose, Bld: 101 mg/dL — ABNORMAL HIGH (ref 70–99)
Potassium: 4.1 mmol/L (ref 3.5–5.1)
Sodium: 137 mmol/L (ref 135–145)

## 2021-12-12 LAB — MAGNESIUM: Magnesium: 1.9 mg/dL (ref 1.7–2.4)

## 2021-12-12 MED ORDER — SODIUM CHLORIDE 0.9% FLUSH
10.0000 mL | Freq: Once | INTRAVENOUS | Status: AC
  Administered 2021-12-12: 10 mL

## 2021-12-12 MED ORDER — HEPARIN SOD (PORK) LOCK FLUSH 100 UNIT/ML IV SOLN
500.0000 [IU] | Freq: Once | INTRAVENOUS | Status: AC
  Administered 2021-12-12: 500 [IU]

## 2021-12-12 NOTE — Assessment & Plan Note (Signed)
She has slight persistent anemia since treatment ?Observe only ?

## 2021-12-12 NOTE — Assessment & Plan Note (Signed)
She has fully recovered from side effects of treatment ?I recommend pet imaging to be done first week of July for objective assessment of response to treatment ?I will see her after that to review test results ?

## 2021-12-12 NOTE — Progress Notes (Signed)
[place] ?OFFICE PROGRESS NOTE ? ?Patient Care Team: ?Leamon Arnt, MD as PCP - General (Family Medicine) ?Key, Nelia Shi, NP as Nurse Practitioner (Gynecology) ?Lyndee Hensen, PT as Physical Therapist (Physical Therapy) ?Awanda Mink Craige Cotta, RN as Oncology Nurse Navigator (Oncology) ? ?ASSESSMENT & PLAN:  ?Malignant neoplasm of cervix ([place]) ?She has fully recovered from side effects of treatment ?I recommend pet imaging to be done first week of July for objective assessment of response to treatment ?I will see her after that to review test results ? ?Deficiency anemia ?She has slight persistent anemia since treatment ?Observe only ? ?Orders Placed This Encounter  ?Procedures  ? NM PET Image Restage (PS) Skull Base to Thigh (F-18 FDG)  ?  Standing Status:   Future  ?  Standing Expiration Date:   12/13/2022  ?  Order Specific Question:   If indicated for the ordered procedure, I authorize the administration of a radiopharmaceutical per Radiology protocol  ?  Answer:   Yes  ?  Order Specific Question:   Preferred imaging location?  ?  Answer:   [facility]  ?  Order Specific Question:   Radiology Contrast Protocol - do NOT remove file path  ?  Answer:   [email or web address]  ? ? ?All questions were answered. The patient knows to call the clinic with any problems, questions or concerns. ?The total time spent in the appointment was 20 minutes encounter with patients including review of chart and various tests results, discussions about plan of care and coordination of care plan ?  ?Heath Lark, MD ?12/12/2021 11:03 AM ? ?INTERVAL HISTORY: ?Please see below for problem oriented charting. ?she returns for treatment follow-up  ?She has completed radiation therapy approximately 3 weeks ago ?She is feeling good ?Denies diarrhea or nausea ?No peripheral neuropathy from treatment ? ?REVIEW OF SYSTEMS:   ?Constitutional: Denies fevers, chills or abnormal weight  loss ?Eyes: Denies blurriness of vision ?Ears, nose, mouth, throat, and face: Denies mucositis or sore throat ?Respiratory: Denies cough, dyspnea or wheezes ?Cardiovascular: Denies palpitation, chest discomfort or lower extremity swelling ?Gastrointestinal:  Denies nausea, heartburn or change in bowel habits ?Skin: Denies abnormal skin rashes ?Lymphatics: Denies new lymphadenopathy or easy bruising ?Neurological:Denies numbness, tingling or new weaknesses ?Behavioral/Psych: Mood is stable, no new changes  ?All other systems were reviewed with the patient and are negative. ? ?I have reviewed the past medical history, past surgical history, social history and family history with the patient and they are unchanged from previous note. ? ?ALLERGIES:  has No Known Allergies. ? ?MEDICATIONS:  ?Current Outpatient Medications  ?Medication Sig Dispense Refill  ? acetaminophen (TYLENOL) 500 MG tablet Take 500 mg by mouth as needed.    ? alendronate (FOSAMAX) 70 MG tablet Take 1 tablet (70 mg total) by mouth once a week. Take with a full glass of water on an empty stomach and sit upright for 30 minutes. (Patient taking differently: Take 70 mg by mouth every Sunday. Take with a full glass of water on an empty stomach and sit upright for 30 minutes. ?Sunday's) 12 tablet 3  ? ALPRAZolam (XANAX) 0.5 MG tablet Take 0.5-1 tablets (0.25-0.5 mg total) by mouth daily as needed for anxiety (panic symptoms). 30 tablet 0  ? aspirin EC 81 MG EC tablet Take 1 tablet (81 mg total) by mouth daily. Swallow whole. 30 tablet 11  ? atorvastatin (LIPITOR) 40 MG tablet Take 1 tablet (40 mg total) by mouth at bedtime. 90 tablet 3  ?  Calcium Carbonate (CALCIUM-CARB 600 PO) Take 1 tablet by mouth daily.    ? cholecalciferol (VITAMIN D) 1000 UNITS tablet Take 1,000 Units by mouth daily.    ? dexamethasone (DECADRON) 4 MG tablet Take 1 tablet (4 mg total) by mouth daily. 30 tablet 0  ? escitalopram (LEXAPRO) 10 MG tablet Take 1 tablet (10 mg total) by  mouth daily. 90 tablet 3  ? ibuprofen (ADVIL) 800 MG tablet Take 1 tablet (800 mg total) by mouth 3 (three) times daily. 21 tablet 0  ? lidocaine-prilocaine (EMLA) cream Apply to affected area once (Patient not taking: Reported on 10/25/2021) 30 g 3  ? ondansetron (ZOFRAN) 8 MG tablet Take 1 tablet by mouth every 8  hours as needed. 30 tablet 1  ? oxyCODONE-acetaminophen (PERCOCET/ROXICET) 5-325 MG tablet Take 1 tablet by mouth every 6 hours as needed for severe pain. 10 tablet 0  ? pantoprazole (PROTONIX) 20 MG tablet Take 1 tablet (20 mg total) by mouth daily. 90 tablet 3  ? prochlorperazine (COMPAZINE) 10 MG tablet Take 1 tablet  by mouth every 6 hours as needed (Nausea or vomiting). 30 tablet 1  ? ?No current facility-administered medications for this visit.  ? ? ?SUMMARY OF ONCOLOGIC HISTORY: ?Oncology History  ?Malignant neoplasm of cervix [facility])  ?07/05/2021 Pathology Results  ? A. ENDOMETRIAL CURETTINGS:  ?Blood clots containing numerous strips of squamous epithelium with features of high-grade SIL(CIN-3/severe dysplasia).  ?Normal endocervical tissue or endometrial tissue are not identified.  ? ?B. CERVIX, CONE BIOPSY:  ?Invasive squamous cell carcinoma, focally keratinizing and moderately differentiated with circumferential involvement.  ?All margins including the deep margin are positive for squamous cell carcinoma.  ?Please see comment. P16 positive ?  ?08/16/2021 Initial Diagnosis  ? Malignant neoplasm of cervix [facility]) ?  ?08/30/2021 PET scan  ? Focal hypermetabolism in the region of the cervix, consistent with known primary cervical carcinoma. ?  ?No evidence of pelvic lymph node or distant metastatic disease. ?  ?  ?09/01/2021 Cancer Staging  ? Staging form: Cervix Uteri, AJCC Version 9 ?- Clinical stage from 09/01/2021: FIGO Stage IB1 (cT1b1, cN0, cM0) - Signed by Heath Lark, MD on 09/01/2021 ?Stage prefix: Initial diagnosis ? ?  ?09/08/2021 Procedure  ? Successful placement of a right internal jugular  approach power injectable Port-A-Cath. The catheter is ready for immediate use. ?  ?  ?09/13/2021 - 10/11/2021 Chemotherapy  ? Patient is on Treatment Plan : cervical Cisplatin q7d  ?  ?  ? ? ?PHYSICAL EXAMINATION: ?ECOG PERFORMANCE STATUS: 1 - Symptomatic but completely ambulatory ? ?Vitals:  ? 12/12/21 1047  ?BP: 121/77  ?Pulse: 64  ?Resp: 18  ?Temp: 98.1 ?F (36.7 ?C)  ?SpO2: 98%  ? ?Filed Weights  ? 12/12/21 1047  ?Weight: 170 lb 9.6 oz (77.4 kg)  ? ? ?GENERAL:alert, no distress and comfortable ?NEURO: alert & oriented x 3 with fluent speech, no focal motor/sensory deficits ? ?LABORATORY DATA:  ?I have reviewed the data as listed ?   ?Component Value Date/Time  ? NA 138 11/04/2021 0924  ? K 3.5 11/04/2021 0924  ? CL 106 11/04/2021 0924  ? CO2 25 11/04/2021 0924  ? GLUCOSE 91 11/04/2021 0924  ? BUN 18 11/04/2021 0924  ? CREATININE 0.76 11/04/2021 0924  ? CREATININE 0.70 11/03/2013 1005  ? CALCIUM 9.0 11/04/2021 0924  ? PROT 6.7 10/02/2021 1556  ? ALBUMIN 4.0 10/02/2021 1556  ? AST 28 10/02/2021 1556  ? ALT 32 10/02/2021 1556  ? ALKPHOS 63 10/02/2021  1556  ? BILITOT 0.4 10/02/2021 1556  ? GFRNONAA >60 11/04/2021 0924  ? ? ?No results found for: SPEP, UPEP ? ?Lab Results  ?Component Value Date  ? WBC 4.2 12/12/2021  ? NEUTROABS 2.8 12/12/2021  ? HGB 11.8 (L) 12/12/2021  ? HCT 34.6 (L) 12/12/2021  ? MCV 103.9 (H) 12/12/2021  ? PLT 266 12/12/2021  ? ? ?  Chemistry   ?   ?Component Value Date/Time  ? NA 138 11/04/2021 0924  ? K 3.5 11/04/2021 0924  ? CL 106 11/04/2021 0924  ? CO2 25 11/04/2021 0924  ? BUN 18 11/04/2021 0924  ? CREATININE 0.76 11/04/2021 0924  ? CREATININE 0.70 11/03/2013 1005  ?    ?Component Value Date/Time  ? CALCIUM 9.0 11/04/2021 0924  ? ALKPHOS 63 10/02/2021 1556  ? AST 28 10/02/2021 1556  ? ALT 32 10/02/2021 1556  ? BILITOT 0.4 10/02/2021 1556  ?  ? ? ?

## 2021-12-13 ENCOUNTER — Encounter: Payer: Self-pay | Admitting: Radiology

## 2021-12-21 NOTE — Progress Notes (Incomplete)
?  Radiation Oncology         [phone] ?________________________________ ? ?Patient Name: Taylor Buchanan ?MRN: [id number] ?DOB: 1956/02/28 ?Referring Physician: Jeral Pinch ?Date of Service: 11/22/2021 ?[facility], [place] ? ?                                                      End Of Treatment Note ? ?Diagnoses: C53.9-Malignant neoplasm of cervix uteri, unspecified ? ?Cancer Staging: At least stage IB1 SCC grade 2 of the cervix (p16 positive) ? ?Intent: Curative ? ?Radiation Treatment Dates: 09/12/2021 through 11/22/2021 ?IMRT pelvis: 09/12/21 through 10/14/21 ?Brachytherapy cevix: 10/25/21 through 11/22/21 ?Site Technique Total Dose (Gy) Dose per Fx (Gy) Completed Fx Beam Energies  ?Pelvis: Pelvis IMRT 45/45 1.8 25/25 6X  ?Cervix: Cervix_Bst_Fx1 HDR-brachy 5.5/5.5 5.5 1/1 Ir-192  ?Cervix: Cervix_Bst_Fx2 HDR-brachy 5.5/5.5 5.5 1/1 Ir-192  ?Cervix: Cervix_Bst_Fx3 HDR-brachy 5.5/5.5 5.5 1/1 Ir-192  ?Cervix: Cervix_Bst_Fx4 HDR-brachy 5.5/5.5 5.5 1/1 Ir-192  ?Cervix: Cervix_Bst_Fx5 HDR-brachy 5.5/5.5 5.5 1/1 Ir-192  ? ?Narrative: The patient ttolerated the brachytherapy portion of her treatment very well. On the date of her final weekly treatment check for pelvic IMRT on 10/11/21, the patient reported a new bump to her left groin, and endorsed ongoing issues with diarrhea which was her most significant issue. For diarrhea, she used Imodium alternating with Lomotil.  ? ?Plan: The patient will follow-up with radiation oncology in one month . ? ?________________________________________________ ?----------------------------------- ? ?Blair Promise, PhD, MD ? ?This document serves as a record of services personally performed by Gery Pray, MD. It was created on his behalf by Roney Mans, a trained medical scribe. The creation of this record is based on the scribe's personal observations and the provider's statements to them. This document has been checked and approved by the attending  provider. ? ?

## 2021-12-21 NOTE — Progress Notes (Signed)
?Radiation Oncology         [phone] ?________________________________ ? ?Name: Taylor Buchanan MRN: [id number]  ?Date: 12/22/2021  DOB: [birth year] ? ?Follow-Up Visit Note ? ?CC: Leamon Arnt, MD  Lafonda Mosses, MD ? ?  ICD-10-CM   ?1. Malignant neoplasm of cervix, unspecified site [facility])  C53.9   ?  ? ? ?Diagnosis: The encounter diagnosis was Malignant neoplasm of cervix, unspecified site [facility]). ?  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 ?Interval Since Last Radiation: 1 month  ? ?Intent: Curative ? ?Radiation Treatment Dates: 09/12/2021 through 11/22/2021 ? ?IMRT pelvis: 09/12/21 through 10/14/21 ? ?Brachytherapy cevix: 10/25/21 through 11/22/21 ? ?Site Technique Total Dose (Gy) Dose per Fx (Gy) Completed Fx Beam Energies  ?Pelvis: Pelvis IMRT 45/45 1.8 25/25 6X  ?Cervix: Cervix_Bst_Fx1 HDR-brachy 5.5/5.5 5.5 1/1 Ir-192  ?Cervix: Cervix_Bst_Fx2 HDR-brachy 5.5/5.5 5.5 1/1 Ir-192  ?Cervix: Cervix_Bst_Fx3 HDR-brachy 5.5/5.5 5.5 1/1 Ir-192  ?Cervix: Cervix_Bst_Fx4 HDR-brachy 5.5/5.5 5.5 1/1 Ir-192  ?Cervix: Cervix_Bst_Fx5 HDR-brachy 5.5/5.5 5.5 1/1 Ir-192  ? ? ?Narrative:  The patient returns today for routine 1 month follow-up. The patient tolerated the brachytherapy portion of her treatment very well. On the date of her final weekly treatment check for pelvic IMRT on 10/11/21, the patient reported a new bump to her left groin, and endorsed ongoing issues with diarrhea which was her most significant issue. For diarrhea, she used Imodium alternating with Lomotil.  She also presented to the ED on 10/02/21 due to her significant diarrhea where she received IVFs and zofran with improvement.  ? ?Since her initial consultation date of 08/31/21, the patient completed her chemotherapy course consisting of cisplatin on 10/11/2021 under the care of Dr. Alvy Bimler. During a follow up visit with Dr. Alvy Bimler on 12/12/21 the patient was noted to have fully recovered from her treatment side effects. (She  was noted to have slight persistent anemia since treatment, but she is asymptomatic for this).  ? ?Otherwise, no significant interval history since the patient completed RT. she reports good appetite.  She denies any pelvic pain abdominal bloating vaginal bleeding or discharge.  She denies any rectal bleeding or hematuria.  Overall she is quite pleased with the way she feels.  She has very minimal fatigue at this time. ? ?Of note: The patient is scheduled for restaging PET on 02/22/22.  She will see Dr. Alvy Bimler after this PET scan for review. ? ?Allergies:  has No Known Allergies. ? ?Meds: ?Current Outpatient Medications  ?Medication Sig Dispense Refill  ? acetaminophen (TYLENOL) 500 MG tablet Take 500 mg by mouth as needed.    ? alendronate (FOSAMAX) 70 MG tablet Take 1 tablet (70 mg total) by mouth once a week. Take with a full glass of water on an empty stomach and sit upright for 30 minutes. (Patient taking differently: Take 70 mg by mouth every Sunday. Take with a full glass of water on an empty stomach and sit upright for 30 minutes. ?Sunday's) 12 tablet 3  ? ALPRAZolam (XANAX) 0.5 MG tablet Take 0.5-1 tablets (0.25-0.5 mg total) by mouth daily as needed for anxiety (panic symptoms). 30 tablet 0  ? aspirin EC 81 MG EC tablet Take 1 tablet (81 mg total) by mouth daily. Swallow whole. 30 tablet 11  ? atorvastatin (LIPITOR) 40 MG tablet Take 1 tablet (40 mg total) by mouth at bedtime. 90 tablet 3  ? Calcium Carbonate (CALCIUM-CARB 600 PO) Take 1 tablet by mouth daily.    ?  cholecalciferol (VITAMIN D) 1000 UNITS tablet Take 1,000 Units by mouth daily.    ? escitalopram (LEXAPRO) 10 MG tablet Take 1 tablet (10 mg total) by mouth daily. 90 tablet 3  ? ibuprofen (ADVIL) 800 MG tablet Take 1 tablet (800 mg total) by mouth 3 (three) times daily. 21 tablet 0  ? dexamethasone (DECADRON) 4 MG tablet Take 1 tablet (4 mg total) by mouth daily. (Patient not taking: Reported on 12/22/2021) 30 tablet 0  ? lidocaine-prilocaine  (EMLA) cream Apply to affected area once (Patient not taking: Reported on 10/25/2021) 30 g 3  ? ondansetron (ZOFRAN) 8 MG tablet Take 1 tablet by mouth every 8  hours as needed. (Patient not taking: Reported on 12/22/2021) 30 tablet 1  ? oxyCODONE-acetaminophen (PERCOCET/ROXICET) 5-325 MG tablet Take 1 tablet by mouth every 6 hours as needed for severe pain. (Patient not taking: Reported on 12/22/2021) 10 tablet 0  ? pantoprazole (PROTONIX) 20 MG tablet Take 1 tablet (20 mg total) by mouth daily. (Patient not taking: Reported on 12/22/2021) 90 tablet 3  ? prochlorperazine (COMPAZINE) 10 MG tablet Take 1 tablet  by mouth every 6 hours as needed (Nausea or vomiting). (Patient not taking: Reported on 12/22/2021) 30 tablet 1  ? ?No current facility-administered medications for this encounter.  ? ? ?Physical Findings: ?The patient is in no acute distress. Patient is alert and oriented. ? height is 5' 6.5" (1.689 m) and weight is 171 lb 8 oz (77.8 kg). Her temporal temperature is 97 ?F (36.1 ?C) (abnormal). Her blood pressure is 120/79 and her pulse is 69. Her respiration is 18 and oxygen saturation is 97%. .   Lungs are clear to auscultation bilaterally. Heart has regular rate and rhythm. No palpable cervical, supraclavicular, or axillary adenopathy. Abdomen soft, non-tender, normal bowel sounds.  Pelvic exam deferred in light of recent completion of treatment ? ? ?Lab Findings: ?Lab Results  ?Component Value Date  ? WBC 4.2 12/12/2021  ? HGB 11.8 (L) 12/12/2021  ? HCT 34.6 (L) 12/12/2021  ? MCV 103.9 (H) 12/12/2021  ? PLT 266 12/12/2021  ? ? ?Radiographic Findings: ?No results found. ? ?Impression: At least stage IB1 SCC grade 2 of the cervix (p16 positive) ? ?She has recovered extremely well from her radiation therapy.  Her only issue at this time is very mild fatigue.  Today the patient was given a vaginal dilator and instructions on its use. ? ?Plan: She will proceed with a PET scan on July 5.  She will see Dr. Alvy Bimler July 7.   She will also see Dr. Berline Lopes same day or sometime in July.  Routine follow-up in radiation oncology in 5 months. ? ? ?____________________________________ ? ?Blair Promise, PhD, MD ? ?This document serves as a record of services personally performed by Gery Pray, MD. It was created on his behalf by Roney Mans, a trained medical scribe. The creation of this record is based on the scribe's personal observations and the provider's statements to them. This document has been checked and approved by the attending provider. ? ?

## 2021-12-22 ENCOUNTER — Ambulatory Visit
Admission: RE | Admit: 2021-12-22 | Discharge: 2021-12-22 | Disposition: A | Discharge status: Home / Self Care | Payer: Medicare Other | Source: Ambulatory Visit | Attending: Radiation Oncology | Admitting: Radiation Oncology

## 2021-12-22 ENCOUNTER — Encounter: Payer: Self-pay | Admitting: Radiation Oncology

## 2021-12-22 ENCOUNTER — Other Ambulatory Visit: Payer: Self-pay

## 2021-12-22 ENCOUNTER — Telehealth: Payer: Self-pay | Admitting: Radiation Oncology

## 2021-12-22 DIAGNOSIS — Z7952 Long term (current) use of systemic steroids: Secondary | ICD-10-CM | POA: Insufficient documentation

## 2021-12-22 DIAGNOSIS — Z79899 Other long term (current) drug therapy: Secondary | ICD-10-CM | POA: Insufficient documentation

## 2021-12-22 DIAGNOSIS — C539 Malignant neoplasm of cervix uteri, unspecified: Secondary | ICD-10-CM | POA: Insufficient documentation

## 2021-12-22 DIAGNOSIS — R5383 Other fatigue: Secondary | ICD-10-CM | POA: Diagnosis not present

## 2021-12-22 NOTE — Patient Instructions (Signed)

## 2021-12-22 NOTE — Telephone Encounter (Signed)
Called patient to inform of her 05/25/22 5 month follow up with Dr. Sondra Come. No answer, LVM with appointment date and time. Advised to call back if the appointment does not work with her schedule. Placed appointment reminder in mail. ?

## 2021-12-22 NOTE — Progress Notes (Signed)
Taylor Buchanan is here today for follow up post radiation to the pelvic. ? ?They completed their radiation on: 11/22/21  ? ?Does the patient complain of any of the following: ? ?Pain: No ?Abdominal bloating: No ?Diarrhea/Constipation: Soft stools.  ?Nausea/Vomiting: No ?Vaginal Discharge: No ?Blood in Urine or Stool: No ?Urinary Issues (dysuria/incomplete emptying/ incontinence/ increased frequency/urgency): No ?Does patient report using vaginal dilator 2-3 times a week and/or sexually active 2-3 weeks:  Patient given S+, M dilators with instructions and importance of use. Patient voiced understanding.  ?Post radiation skin changes: No ? ? ?Additional comments if applicable: ? ?Vitals:  ? 12/22/21 1048  ?BP: 120/79  ?Pulse: 69  ?Resp: 18  ?Temp: (!) 97 ?F (36.1 ?C)  ?TempSrc: Temporal  ?SpO2: 97%  ?Weight: 171 lb 8 oz (77.8 kg)  ?Height: 5' 6.5" (1.689 m)  ?  ?  ?

## 2022-01-25 ENCOUNTER — Encounter: Payer: Medicare Other | Admitting: Family Medicine

## 2022-02-08 ENCOUNTER — Ambulatory Visit (INDEPENDENT_AMBULATORY_CARE_PROVIDER_SITE_OTHER): Payer: Medicare Other | Admitting: Family Medicine

## 2022-02-08 ENCOUNTER — Encounter: Payer: Self-pay | Admitting: Family Medicine

## 2022-02-08 VITALS — BP 120/62 | HR 69 | Temp 98.7°F | Ht 66.5 in | Wt 169.0 lb

## 2022-02-08 DIAGNOSIS — F410 Panic disorder [episodic paroxysmal anxiety] without agoraphobia: Secondary | ICD-10-CM | POA: Diagnosis not present

## 2022-02-08 DIAGNOSIS — M810 Age-related osteoporosis without current pathological fracture: Secondary | ICD-10-CM

## 2022-02-08 DIAGNOSIS — E782 Mixed hyperlipidemia: Secondary | ICD-10-CM

## 2022-02-08 DIAGNOSIS — D701 Agranulocytosis secondary to cancer chemotherapy: Secondary | ICD-10-CM

## 2022-02-08 DIAGNOSIS — C539 Malignant neoplasm of cervix uteri, unspecified: Secondary | ICD-10-CM | POA: Diagnosis not present

## 2022-02-08 DIAGNOSIS — K219 Gastro-esophageal reflux disease without esophagitis: Secondary | ICD-10-CM

## 2022-02-08 DIAGNOSIS — Z0000 Encounter for general adult medical examination without abnormal findings: Secondary | ICD-10-CM

## 2022-02-08 DIAGNOSIS — E538 Deficiency of other specified B group vitamins: Secondary | ICD-10-CM

## 2022-02-08 DIAGNOSIS — T451X5A Adverse effect of antineoplastic and immunosuppressive drugs, initial encounter: Secondary | ICD-10-CM

## 2022-02-08 LAB — LIPID PANEL
Cholesterol: 106 mg/dL (ref 0–200)
HDL: 42.6 mg/dL (ref >39.00)
LDL Cholesterol: 48 mg/dL (ref 0–99)
NonHDL: 63.79
Total CHOL/HDL Ratio: 2
Triglycerides: 78 mg/dL (ref 0.0–149.0)
VLDL: 15.6 mg/dL (ref 0.0–40.0)

## 2022-02-08 NOTE — Progress Notes (Signed)
Subjective  Chief Complaint  Patient presents with   Annual Exam    Pt here for Annual exam and is not currently fasting     HPI: Taylor Buchanan is a 66 y.o. female who presents to [facility] today for a Female Wellness Visit. She also has the concerns and/or needs as listed above in the chief complaint. These will be addressed in addition to the Health Maintenance Visit.   Wellness Visit: annual visit with health maintenance review and exam without Pap  Health maintenance: Screens are current.  Due for bone density.  Follow-up osteoporosis on Fosamax for over a year now.  Tolerating well.  Eligible for Prevnar 20 but just completed chemotherapy so would like to defer several months.  Colon cancer screens are current. Chronic disease f/u and/or acute problem visit: (deemed necessary to be done in addition to the wellness visit): Cervical cancer:Patient had ASCUS Pap last October, diagnosed with squamous cell carcinoma of the cervix in December.  She had conization of the cervix, radiation and chemotherapy.  She completed her treatment in April.  Reviewed oncology notes.  Fortunately she is now starting to feel better after her treatment.  Still with some abdominal bloating.  Reviewed recent lab work including CBC, CMP, TSH, B12 levels.  She had some leukopenia and deficiency anemias.  These are trending better.  She is not currently on a B12 supplement and most recent B12 level was low.  She is on chronic PPI for GERD.  This is well controlled. Panic disorder and anxiety disorder: Has done very well on Lexapro 10 daily. Hyperlipidemia on Lipitor 40 mg nightly.  Nonfasting for recheck.  We will push LDL less than 70 given possible history of TIA although the differential diagnosis included a migraine equivalent.  Assessment  1. Annual physical exam   2. Osteoporosis without current pathological fracture, unspecified osteoporosis type   3. Mixed hyperlipidemia   4.  Panic disorder   5. Malignant neoplasm of cervix, unspecified site ([place])   6. Leukopenia due to antineoplastic chemotherapy ([place])   7. Vitamin B12 deficiency   8. Gastroesophageal reflux disease without esophagitis      Plan  Female Wellness Visit: Age appropriate Health Maintenance and Prevention measures were discussed with patient. Included topics are cancer screening recommendations, ways to keep healthy (see AVS) including dietary and exercise recommendations, regular eye and dental care, use of seat belts, and avoidance of moderate alcohol use and tobacco use.  She will continue with Pap smears, mammograms and ordered bone density follow-up osteoporosis on Fosamax BMI: discussed patient's BMI and encouraged positive lifestyle modifications to help get to or maintain a target BMI. HM needs and immunizations were addressed and ordered. See below for orders. See HM and immunization section for updates.  She will return for flu shot and Prevnar 20 in the fall Routine labs and screening tests ordered including cmp, cbc and lipids where appropriate. Discussed recommendations regarding Vit D and calcium supplementation (see AVS)  Chronic disease management visit and/or acute problem visit: Hyperlipidemia is due for recheck and lipid panel as ordered.  LFTs have been normal.  She tolerates atorvastatin 40 mg daily well. Osteoporosis: Tolerating Fosamax weekly well.  Chronic PPI for GERD which is well controlled Cervical cancer status postchemotherapy and radiation therapy.  Still with abdominal GI bloating.  Discussed probiotic, Gas-X and Metamucil.  She will monitor.  Seems to be improving Monitoring CBC.  Numbers are improving Add B12 over-the-counter  daily supplement Continue pantoprazole 20 mg daily. Continue Lexapro 10 daily for anxiety disorder.  Very well controlled  Follow up: 1 year for complete physical Orders Placed This Encounter  Procedures   DG Bone Density   Lipid panel    No orders of the defined types were placed in this encounter.     Body mass index is 26.87 kg/m. Wt Readings from Last 3 Encounters:  02/08/22 169 lb (76.7 kg)  12/22/21 171 lb 8 oz (77.8 kg)  12/12/21 170 lb 9.6 oz (77.4 kg)     Patient Active Problem List   Diagnosis Date Noted   Malignant neoplasm of cervix ([place]) 08/16/2021    Priority: High   Panic disorder 05/31/2021    Priority: High   Mixed hyperlipidemia 02/14/2021    Priority: High   TIA (transient ischemic attack) 01/29/2021    Priority: High    Vs migraine equivalent; evaluated by neuro    Osteoporosis 09/02/2013    Priority: High    Dexa 06/2019: T - -3.3 radius, -2.3 hip. Failed biphosphonates in past. rec prolia. Restarted fosamax 05/2021 due to cost of prolia.    Gastroesophageal reflux disease without esophagitis 02/08/2022    Priority: Low   Vitamin B12 deficiency 02/08/2022    Priority: Low   Deficiency anemia 11/03/2021    Priority: Low   Scoliosis 04/18/2019    Priority: Low   Leukopenia due to antineoplastic chemotherapy ([place]) 10/10/2021   Health Maintenance  Topic Date Due   Pneumonia Vaccine 52+ Years old (1 - PCV) Never done   COVID-19 Vaccine (4 - Booster for [place] series) 02/24/2022 (Originally 01/15/2020)   INFLUENZA VACCINE  03/21/2022   MAMMOGRAM  05/11/2022   DEXA SCAN  07/03/2022   COLONOSCOPY (Pts 45-65yr Insurance coverage will need to be confirmed)  03/22/2023   TETANUS/TDAP  09/03/2023   PAP SMEAR-Modifier  05/11/2026   Hepatitis C Screening  Completed   Zoster Vaccines- Shingrix  Completed   HPV VACCINES  Aged Out   HIV Screening  Discontinued   Immunization History  Administered Date(s) Administered   Influenza,inj,Quad PF,6+ Mos 04/18/2019, 08/05/2020, 05/31/2021   PFIZER(Purple Top)SARS-COV-2 Vaccination 09/26/2019, 10/30/2019, 11/20/2019   Tdap 09/02/2013   Zoster Recombinat (Shingrix) 08/29/2019, 02/26/2020   We updated and reviewed the patient's past history  in detail and it is documented below. Allergies: Patient has No Known Allergies. Past Medical History Patient  has a past medical history of Abnormal Pap smear of cervix, Arthritis, Cervical cancer ([place], Depression, GAD (generalized anxiety disorder), GERD (gastroesophageal reflux disease), History of migraine, History of radiation therapy, History of radiation therapy, History of transient ischemic attack (TIA) (01/2021), HLD (hyperlipidemia), Osteoporosis, Pain disorder, Scoliosis, and Torn ACL (anterior cruciate ligament) left hand (11/05/2021). Past Surgical History Patient  has a past surgical history that includes Cholecystectomy open (2005); Tendon repair (Left, 2008); Cervical conization w/bx (N/A, 08/04/2021); IR IMAGING GUIDED PORT INSERTION (09/08/2021); Tandem ring insertion (N/A, 10/25/2021); Operative ultrasound (N/A, 10/25/2021); Tandem ring insertion (N/A, 11/03/2021); Operative ultrasound (N/A, 11/03/2021); Tandem ring insertion (N/A, 11/10/2021); Operative ultrasound (N/A, 11/10/2021); Tandem ring insertion (N/A, 11/18/2021); Operative ultrasound (N/A, 11/18/2021); Tandem ring insertion (N/A, 11/22/2021); and Operative ultrasound (N/A, 11/22/2021). Family History: Patient family history includes Alzheimer's disease in her mother; Bladder Cancer in her father; CAD in her maternal grandfather and maternal grandmother; Dementia in her mother; Healthy in her brother, brother, brother, son, and son; Mental illness in her mother; Stroke in her mother. Social History:  Patient  reports that  she quit smoking about 26 years ago. Her smoking use included cigarettes. She has a 15.00 pack-year smoking history. She has never used smokeless tobacco. She reports current alcohol use of about 3.0 standard drinks of alcohol per week. She reports that she does not use drugs.  Review of Systems: Constitutional: negative for fever or malaise Ophthalmic: negative for photophobia, double vision or loss of  vision Cardiovascular: negative for chest pain, dyspnea on exertion, or new LE swelling Respiratory: negative for SOB or persistent cough Gastrointestinal: negative for abdominal pain, change in bowel habits or melena Genitourinary: negative for dysuria or gross hematuria, no abnormal uterine bleeding or disharge Musculoskeletal: negative for new gait disturbance or muscular weakness Integumentary: negative for new or persistent rashes, no breast lumps Neurological: negative for TIA or stroke symptoms Psychiatric: negative for SI or delusions Allergic/Immunologic: negative for hives  Patient Care Team    Relationship Specialty Notifications Start End  Leamon Arnt, MD PCP - General Family Medicine  04/18/19   Key, Nelia Shi, NP Nurse Practitioner Gynecology  09/02/13   Lyndee Hensen, PT Physical Therapist Physical Therapy  09/15/19   Heath Lark, MD Consulting Physician Hematology and Oncology  02/08/22   Sharyn Creamer, MD Consulting Physician Gastroenterology  02/08/22     Objective  Vitals: BP 120/62   Pulse 69   Temp 98.7 F (37.1 C)   Ht 5' 6.5" (1.689 m)   Wt 169 lb (76.7 kg)   LMP 08/21/2002   SpO2 95%   BMI 26.87 kg/m  General:  Well developed, well nourished, no acute distress  Psych:  Alert and orientedx3,normal mood and affect HEENT:  Normocephalic, atraumatic, non-icteric sclera,  supple neck without adenopathy, mass or thyromegaly Cardiovascular:  Normal S1, S2, RRR without gallop, rub or murmur Respiratory:  Good breath sounds bilaterally, CTAB with normal respiratory effort, right upper chest wall with port in place, nontender without erythema Gastrointestinal: normal bowel sounds, soft, non-tender, no noted masses. No HSM MSK: no deformities, contusions. Joints are without erythema or swelling.  Skin:  Warm, no rashes or suspicious lesions noted Neurologic:    Mental status is normal. CN 2-11 are normal. Gross motor and sensory exams are normal. Normal gait. No  tremor   Commons side effects, risks, benefits, and alternatives for medications and treatment plan prescribed today were discussed, and the patient expressed understanding of the given instructions. Patient is instructed to call or message via MyChart if he/she has any questions or concerns regarding our treatment plan. No barriers to understanding were identified. We discussed Red Flag symptoms and signs in detail. Patient expressed understanding regarding what to do in case of urgent or emergency type symptoms.  Medication list was reconciled, printed and provided to the patient in AVS. Patient instructions and summary information was reviewed with the patient as documented in the AVS. This note was prepared with assistance of Dragon voice recognition software. Occasional wrong-word or sound-a-like substitutions may have occurred due to the inherent limitations of voice recognition software  This visit occurred during the SARS-CoV-2 public health emergency.  Safety protocols were in place, including screening questions prior to the visit, additional usage of staff PPE, and extensive cleaning of exam room while observing appropriate contact time as indicated for disinfecting solutions.

## 2022-02-08 NOTE — Patient Instructions (Addendum)
Please return in 12 months for your annual complete physical; please come fasting.  You may return in the early fall to receive your flu and Prevnar 20 vaccinations.   Please start OTC Vitamin B12 supplements daily.  You may use a probiotic for 1-3 months, simethicone (GAS-X) and/or metamucil to help your bowels as we discussed.   If you have any questions or concerns, please don't hesitate to send me a message via MyChart or call the office at [phone]. Thank you for visiting with [place] today! It's our pleasure caring for you.

## 2022-02-22 ENCOUNTER — Other Ambulatory Visit: Payer: Self-pay

## 2022-02-22 ENCOUNTER — Inpatient Hospital Stay: Payer: Medicare Other | Attending: Gynecologic Oncology

## 2022-02-22 ENCOUNTER — Encounter (HOSPITAL_COMMUNITY)
Admission: RE | Admit: 2022-02-22 | Discharge: 2022-02-22 | Disposition: A | Discharge status: Home / Self Care | Payer: Medicare Other | Source: Ambulatory Visit | Attending: Hematology and Oncology | Admitting: Hematology and Oncology

## 2022-02-22 DIAGNOSIS — C539 Malignant neoplasm of cervix uteri, unspecified: Secondary | ICD-10-CM | POA: Insufficient documentation

## 2022-02-22 DIAGNOSIS — Z7982 Long term (current) use of aspirin: Secondary | ICD-10-CM | POA: Insufficient documentation

## 2022-02-22 DIAGNOSIS — Z923 Personal history of irradiation: Secondary | ICD-10-CM | POA: Insufficient documentation

## 2022-02-22 DIAGNOSIS — Z79899 Other long term (current) drug therapy: Secondary | ICD-10-CM | POA: Insufficient documentation

## 2022-02-22 DIAGNOSIS — Z9221 Personal history of antineoplastic chemotherapy: Secondary | ICD-10-CM | POA: Insufficient documentation

## 2022-02-22 LAB — CBC WITH DIFFERENTIAL (CANCER CENTER ONLY)
Abs Immature Granulocytes: 0.01 10*3/uL (ref 0.00–0.07)
Basophils Absolute: 0 10*3/uL (ref 0.0–0.1)
Basophils Relative: 1 %
Eosinophils Absolute: 0.1 10*3/uL (ref 0.0–0.5)
Eosinophils Relative: 2 %
HCT: 40.1 % (ref 36.0–46.0)
Hemoglobin: 14.1 g/dL (ref 12.0–15.0)
Immature Granulocytes: 0 %
Lymphocytes Relative: 18 %
Lymphs Abs: 0.7 10*3/uL (ref 0.7–4.0)
MCH: 34.2 pg — ABNORMAL HIGH (ref 26.0–34.0)
MCHC: 35.2 g/dL (ref 30.0–36.0)
MCV: 97.3 fL (ref 80.0–100.0)
Monocytes Absolute: 0.4 10*3/uL (ref 0.1–1.0)
Monocytes Relative: 9 %
Neutro Abs: 2.9 10*3/uL (ref 1.7–7.7)
Neutrophils Relative %: 70 %
Platelet Count: 249 10*3/uL (ref 150–400)
RBC: 4.12 MIL/uL (ref 3.87–5.11)
RDW: 12 % (ref 11.5–15.5)
WBC Count: 4.2 10*3/uL (ref 4.0–10.5)
nRBC: 0 % (ref 0.0–0.2)

## 2022-02-22 LAB — BASIC METABOLIC PANEL - CANCER CENTER ONLY
Anion gap: 7 (ref 5–15)
BUN: 16 mg/dL (ref 8–23)
CO2: 26 mmol/L (ref 22–32)
Calcium: 9.5 mg/dL (ref 8.9–10.3)
Chloride: 105 mmol/L (ref 98–111)
Creatinine: 0.96 mg/dL (ref 0.44–1.00)
GFR, Estimated: >60 mL/min (ref >60)
Glucose, Bld: 107 mg/dL — ABNORMAL HIGH (ref 70–99)
Potassium: 3.9 mmol/L (ref 3.5–5.1)
Sodium: 138 mmol/L (ref 135–145)

## 2022-02-22 LAB — GLUCOSE, CAPILLARY: Glucose-Capillary: 109 mg/dL — ABNORMAL HIGH (ref 70–99)

## 2022-02-22 MED ORDER — SODIUM CHLORIDE 0.9% FLUSH: 10.0000 mL | Freq: Once | INTRAVENOUS | Status: DC

## 2022-02-22 MED ORDER — HEPARIN SOD (PORK) LOCK FLUSH 100 UNIT/ML IV SOLN
500.0000 [IU] | Freq: Once | INTRAVENOUS | Status: AC | PRN
  Administered 2022-02-22: 500 [IU]

## 2022-02-22 MED ORDER — SODIUM CHLORIDE 0.9% FLUSH
10.0000 mL | Freq: Once | INTRAVENOUS | Status: AC
  Administered 2022-02-22: 10 mL

## 2022-02-22 MED ORDER — FLUDEOXYGLUCOSE F - 18 (FDG) INJECTION
8.4500 | Freq: Once | INTRAVENOUS | Status: AC
  Administered 2022-02-22: 8.42 via INTRAVENOUS

## 2022-02-23 ENCOUNTER — Encounter: Payer: Self-pay | Admitting: Hematology and Oncology

## 2022-02-24 ENCOUNTER — Encounter: Payer: Self-pay | Admitting: Hematology and Oncology

## 2022-02-24 ENCOUNTER — Inpatient Hospital Stay: Payer: Medicare Other | Admitting: Hematology and Oncology

## 2022-02-24 ENCOUNTER — Ambulatory Visit: Payer: Medicare Other | Admitting: Hematology and Oncology

## 2022-02-24 DIAGNOSIS — C539 Malignant neoplasm of cervix uteri, unspecified: Secondary | ICD-10-CM

## 2022-02-24 DIAGNOSIS — Z9221 Personal history of antineoplastic chemotherapy: Secondary | ICD-10-CM | POA: Diagnosis not present

## 2022-02-24 DIAGNOSIS — Z7982 Long term (current) use of aspirin: Secondary | ICD-10-CM | POA: Diagnosis not present

## 2022-02-24 DIAGNOSIS — Z923 Personal history of irradiation: Secondary | ICD-10-CM | POA: Diagnosis not present

## 2022-02-24 DIAGNOSIS — Z79899 Other long term (current) drug therapy: Secondary | ICD-10-CM | POA: Diagnosis not present

## 2022-02-24 NOTE — Progress Notes (Signed)
[facility] OFFICE PROGRESS NOTE  Patient Care Team: Leamon Arnt, MD as PCP - General (Family Medicine) Key, Nelia Shi, NP as Nurse Practitioner (Gynecology) Lyndee Hensen, PT as Physical Therapist (Physical Therapy) Heath Lark, MD as Consulting Physician (Hematology and Oncology) Sharyn Creamer, MD as Consulting Physician (Gastroenterology)  ASSESSMENT & PLAN:  Malignant neoplasm of cervix [facility]) She has complete response to treatment We will get her port removed She will continue follow-up with GYN oncologist in the future  Orders Placed This Encounter  Procedures   IR REMOVAL TUN ACCESS W/ PORT W/O FL MOD SED    Standing Status:   Future    Standing Expiration Date:   02/25/2023    Order Specific Question:   Reason for exam:    Answer:   no need port    Order Specific Question:   Preferred Imaging Location?    Answer:   [facility]    All questions were answered. The patient knows to call the clinic with any problems, questions or concerns. The total time spent in the appointment was 20 minutes encounter with patients including review of chart and various tests results, discussions about plan of care and coordination of care plan   Heath Lark, MD 02/24/2022 2:14 PM  INTERVAL HISTORY: Please see below for problem oriented charting. she returns for review of test results She has completed concurrent chemoradiation therapy She has no residual side effects from treatment  REVIEW OF SYSTEMS:   Constitutional: Denies fevers, chills or abnormal weight loss Eyes: Denies blurriness of vision Ears, nose, mouth, throat, and face: Denies mucositis or sore throat Respiratory: Denies cough, dyspnea or wheezes Cardiovascular: Denies palpitation, chest discomfort or lower extremity swelling Gastrointestinal:  Denies nausea, heartburn or change in bowel habits Skin: Denies abnormal skin rashes Lymphatics: Denies new lymphadenopathy or easy  bruising Neurological:Denies numbness, tingling or new weaknesses Behavioral/Psych: Mood is stable, no new changes  All other systems were reviewed with the patient and are negative.  I have reviewed the past medical history, past surgical history, social history and family history with the patient and they are unchanged from previous note.  ALLERGIES:  has No Known Allergies.  MEDICATIONS:  Current Outpatient Medications  Medication Sig Dispense Refill   acetaminophen (TYLENOL) 500 MG tablet Take 500 mg by mouth as needed.     alendronate (FOSAMAX) 70 MG tablet Take 1 tablet (70 mg total) by mouth once a week. Take with a full glass of water on an empty stomach and sit upright for 30 minutes. (Patient taking differently: Take 70 mg by mouth every Sunday. Take with a full glass of water on an empty stomach and sit upright for 30 minutes. Sunday's) 12 tablet 3   ALPRAZolam (XANAX) 0.5 MG tablet Take 0.5-1 tablets (0.25-0.5 mg total) by mouth daily as needed for anxiety (panic symptoms). 30 tablet 0   aspirin EC 81 MG EC tablet Take 1 tablet (81 mg total) by mouth daily. Swallow whole. 30 tablet 11   atorvastatin (LIPITOR) 40 MG tablet Take 1 tablet (40 mg total) by mouth at bedtime. 90 tablet 3   Calcium Carbonate (CALCIUM-CARB 600 PO) Take 1 tablet by mouth daily.     cholecalciferol (VITAMIN D) 1000 UNITS tablet Take 1,000 Units by mouth daily.     escitalopram (LEXAPRO) 10 MG tablet Take 1 tablet (10 mg total) by mouth daily. 90 tablet 3   pantoprazole (PROTONIX) 20 MG tablet Take 1 tablet (20 mg  total) by mouth daily. 90 tablet 3   No current facility-administered medications for this visit.    SUMMARY OF ONCOLOGIC HISTORY: Oncology History  Malignant neoplasm of cervix ([place])  07/05/2021 Pathology Results   A. ENDOMETRIAL CURETTINGS:  Blood clots containing numerous strips of squamous epithelium with features of high-grade SIL(CIN-3/severe dysplasia).  Normal endocervical tissue or  endometrial tissue are not identified.   B. CERVIX, CONE BIOPSY:  Invasive squamous cell carcinoma, focally keratinizing and moderately differentiated with circumferential involvement.  All margins including the deep margin are positive for squamous cell carcinoma.  Please see comment. P16 positive   08/16/2021 Initial Diagnosis   Malignant neoplasm of cervix ([place])   08/30/2021 PET scan   Focal hypermetabolism in the region of the cervix, consistent with known primary cervical carcinoma.   No evidence of pelvic lymph node or distant metastatic disease.     09/01/2021 Cancer Staging   Staging form: Cervix Uteri, AJCC Version 9 - Clinical stage from 09/01/2021: FIGO Stage IB1 (cT1b1, cN0, cM0) - Signed by Heath Lark, MD on 09/01/2021 Stage prefix: Initial diagnosis   09/08/2021 Procedure   Successful placement of a right internal jugular approach power injectable Port-A-Cath. The catheter is ready for immediate use.     09/13/2021 - 10/11/2021 Chemotherapy   Patient is on Treatment Plan : cervical Cisplatin q7d      02/23/2022 PET scan   Marked interval response to therapy with reduction of metabolic activity in the area of the cervix. Residual activity only minimally greater than mediastinal blood pool, attention on follow-up.   No signs of metastatic disease.     PHYSICAL EXAMINATION: ECOG PERFORMANCE STATUS: 0 - Asymptomatic  Vitals:   02/24/22 1246  BP: 108/60  Pulse: 70  Resp: 18  Temp: 97.7 F (36.5 C)  SpO2: 98%   Filed Weights   02/24/22 1246  Weight: 167 lb 3.2 oz (75.8 kg)    GENERAL:alert, no distress and comfortable NEURO: alert & oriented x 3 with fluent speech, no focal motor/sensory deficits  LABORATORY DATA:  I have reviewed the data as listed    Component Value Date/Time   NA 138 02/22/2022 0936   K 3.9 02/22/2022 0936   CL 105 02/22/2022 0936   CO2 26 02/22/2022 0936   GLUCOSE 107 (H) 02/22/2022 0936   BUN 16 02/22/2022 0936   CREATININE 0.96  02/22/2022 0936   CREATININE 0.70 11/03/2013 1005   CALCIUM 9.5 02/22/2022 0936   PROT 6.7 10/02/2021 1556   ALBUMIN 4.0 10/02/2021 1556   AST 28 10/02/2021 1556   ALT 32 10/02/2021 1556   ALKPHOS 63 10/02/2021 1556   BILITOT 0.4 10/02/2021 1556   GFRNONAA >60 02/22/2022 0936    No results found for: "SPEP", "UPEP"  Lab Results  Component Value Date   WBC 4.2 02/22/2022   NEUTROABS 2.9 02/22/2022   HGB 14.1 02/22/2022   HCT 40.1 02/22/2022   MCV 97.3 02/22/2022   PLT 249 02/22/2022      Chemistry      Component Value Date/Time   NA 138 02/22/2022 0936   K 3.9 02/22/2022 0936   CL 105 02/22/2022 0936   CO2 26 02/22/2022 0936   BUN 16 02/22/2022 0936   CREATININE 0.96 02/22/2022 0936   CREATININE 0.70 11/03/2013 1005      Component Value Date/Time   CALCIUM 9.5 02/22/2022 0936   ALKPHOS 63 10/02/2021 1556   AST 28 10/02/2021 1556   ALT 32 10/02/2021 1556  BILITOT 0.4 10/02/2021 1556       RADIOGRAPHIC STUDIES: I have reviewed multiple imaging studies with the patient and her husband I have personally reviewed the radiological images as listed and agreed with the findings in the report. NM PET Image Restage (PS) Skull Base to Thigh (F-18 FDG)  Result Date: 02/22/2022 CLINICAL DATA:  Subsequent treatment strategy for cervical neoplasm, assess treatment response. EXAM: NUCLEAR MEDICINE PET SKULL BASE TO THIGH TECHNIQUE: 8.4 mCi F-18 FDG was injected intravenously. Full-ring PET imaging was performed from the skull base to thigh after the radiotracer. CT data was obtained and used for attenuation correction and anatomic localization. Fasting blood glucose: 109 mg/dl COMPARISON:  August 30, 2021. FINDINGS: Mediastinal blood pool activity: SUV max 2.83 Liver activity: SUV max NA NECK: No hypermetabolic lymph nodes in the neck. Incidental CT findings: none CHEST: No hypermetabolic mediastinal or hilar nodes. No suspicious pulmonary nodules on the CT scan. Incidental CT  findings: RIGHT-sided Port-A-Cath terminates in the upper RIGHT atrium. Heart size is stable. No pericardial effusion or nodularity. Scattered aortic atherosclerosis with calcification. Normal caliber of central pulmonary vessels. Limited assessment of cardiovascular structures given lack of intravenous contrast. No effusion. No consolidation. Minimal basilar atelectasis. Visualized airways are patent. ABDOMEN/PELVIS: Area of concern in the in the region of the cervix with markedly diminished metabolic activity, maximum SUV 3.31. No discrete soft tissue mass in this location on the current study. Previous maximum SUV of 9.73. No signs of adenopathy in the abdomen or pelvis. No solid organ metastasis. Incidental CT findings: Post cholecystectomy with stable prominence of the common bile duct. No acute findings in the abdomen or in the pelvis. Bilateral nephrolithiasis with punctate renal calculi similar to prior imaging. SKELETON: No focal hypermetabolic activity to suggest skeletal metastasis. Incidental CT findings: Rotary levoconvex scoliotic curvature of the thoracolumbar spine is similar to previous imaging. IMPRESSION: Marked interval response to therapy with reduction of metabolic activity in the area of the cervix. Residual activity only minimally greater than mediastinal blood pool, attention on follow-up. No signs of metastatic disease. Electronically Signed   By: Zetta Bills M.D.   On: 02/22/2022 16:24

## 2022-02-24 NOTE — Assessment & Plan Note (Signed)
She has complete response to treatment We will get her port removed She will continue follow-up with GYN oncologist in the future

## 2022-03-03 ENCOUNTER — Ambulatory Visit (HOSPITAL_COMMUNITY)
Admission: RE | Admit: 2022-03-03 | Discharge: 2022-03-03 | Disposition: A | Discharge status: Home / Self Care | Payer: Medicare Other | Source: Ambulatory Visit | Attending: Hematology and Oncology | Admitting: Hematology and Oncology

## 2022-03-03 ENCOUNTER — Telehealth: Payer: Self-pay | Admitting: *Deleted

## 2022-03-03 DIAGNOSIS — C539 Malignant neoplasm of cervix uteri, unspecified: Secondary | ICD-10-CM | POA: Diagnosis not present

## 2022-03-03 DIAGNOSIS — Z452 Encounter for adjustment and management of vascular access device: Secondary | ICD-10-CM | POA: Insufficient documentation

## 2022-03-03 HISTORY — PX: IR REMOVAL TUN ACCESS W/ PORT W/O FL MOD SED: IMG2290

## 2022-03-03 MED ORDER — LIDOCAINE HCL 1 % IJ SOLN
INTRAMUSCULAR | Status: AC
  Filled 2022-03-03: qty 20

## 2022-03-03 NOTE — Telephone Encounter (Signed)
Patient scheduled for a follow up appt with Dr Berline Lopes

## 2022-03-17 ENCOUNTER — Encounter: Payer: Self-pay | Admitting: Gynecologic Oncology

## 2022-03-20 ENCOUNTER — Inpatient Hospital Stay (HOSPITAL_BASED_OUTPATIENT_CLINIC_OR_DEPARTMENT_OTHER): Payer: Medicare Other | Admitting: Gynecologic Oncology

## 2022-03-20 ENCOUNTER — Other Ambulatory Visit: Payer: Self-pay

## 2022-03-20 ENCOUNTER — Encounter: Payer: Self-pay | Admitting: Gynecologic Oncology

## 2022-03-20 VITALS — BP 111/70 | HR 77 | Resp 18 | Wt 167.2 lb

## 2022-03-20 DIAGNOSIS — C539 Malignant neoplasm of cervix uteri, unspecified: Secondary | ICD-10-CM | POA: Diagnosis not present

## 2022-03-20 DIAGNOSIS — Z9221 Personal history of antineoplastic chemotherapy: Secondary | ICD-10-CM | POA: Diagnosis not present

## 2022-03-20 DIAGNOSIS — N393 Stress incontinence (female) (male): Secondary | ICD-10-CM

## 2022-03-20 DIAGNOSIS — Z923 Personal history of irradiation: Secondary | ICD-10-CM

## 2022-03-20 DIAGNOSIS — Z79899 Other long term (current) drug therapy: Secondary | ICD-10-CM

## 2022-03-20 NOTE — Patient Instructions (Addendum)
It was good to see you today.  I do not see or feel any evidence of cancer recurrence on your exam.  I put in a referral for pelvic floor physical therapy given your urinary symptoms.  We will continue with visits every 3 months.  Your next visit with me will be in January.  Please call back sometime in late December or early January to schedule this visit.  If you develop any of the symptoms that we discussed today before your next scheduled visit, please call to see me sooner.

## 2022-03-20 NOTE — Progress Notes (Signed)
Gynecologic Oncology Return Clinic Visit  03/10/2022  Reason for Visit: Surveillance visit in the setting of history of cervical cancer  Treatment History: Oncology History  Malignant neoplasm of cervix ([place])  07/05/2021 Pathology Results   A. ENDOMETRIAL CURETTINGS:  Blood clots containing numerous strips of squamous epithelium with features of high-grade SIL(CIN-3/severe dysplasia).  Normal endocervical tissue or endometrial tissue are not identified.   B. CERVIX, CONE BIOPSY:  Invasive squamous cell carcinoma, focally keratinizing and moderately differentiated with circumferential involvement.  All margins including the deep margin are positive for squamous cell carcinoma.  Please see comment. P16 positive   08/16/2021 Initial Diagnosis   Malignant neoplasm of cervix ([place])   08/30/2021 PET scan   Focal hypermetabolism in the region of the cervix, consistent with known primary cervical carcinoma.   No evidence of pelvic lymph node or distant metastatic disease.     09/01/2021 Cancer Staging   Staging form: Cervix Uteri, AJCC Version 9 - Clinical stage from 09/01/2021: FIGO Stage IB1 (cT1b1, cN0, cM0) - Signed by Heath Lark, MD on 09/01/2021 Stage prefix: Initial diagnosis   09/08/2021 Procedure   Successful placement of a right internal jugular approach power injectable Port-A-Cath. The catheter is ready for immediate use.     09/12/2021 - 11/22/2021 Radiation Therapy   IMRT pelvis: 09/12/21 through 10/14/21  Brachytherapy cevix: 10/25/21 through 11/22/21   Site Technique Total Dose (Gy) Dose per Fx (Gy) Completed Fx Beam Energies  Pelvis: Pelvis IMRT 45/45 1.8 25/25 6X  Cervix: Cervix_Bst_Fx1 HDR-brachy 5.5/5.5 5.5 1/1 Ir-192  Cervix: Cervix_Bst_Fx2 HDR-brachy 5.5/5.5 5.5 1/1 Ir-192  Cervix: Cervix_Bst_Fx3 HDR-brachy 5.5/5.5 5.5 1/1 Ir-192  Cervix: Cervix_Bst_Fx4 HDR-brachy 5.5/5.5 5.5 1/1 Ir-192  Cervix: Cervix_Bst_Fx5 HDR-brachy 5.5/5.5 5.5 1/1 Ir-192       09/13/2021  - 10/11/2021 Chemotherapy   Patient is on Treatment Plan : cervical Cisplatin q7d      02/23/2022 PET scan   Marked interval response to therapy with reduction of metabolic activity in the area of the cervix. Residual activity only minimally greater than mediastinal blood pool, attention on follow-up.   No signs of metastatic disease.   03/03/2022 Procedure   Removal of implanted Port-A-Cath utilizing sharp and blunt dissection. The procedure was uncomplicated.     Interval History: The patient saw Dr. Sondra Come for follow-up on 5/4.   Patient presents today for her first surveillance visit with me.  She notes overall doing well.  She had some symptoms during treatment that have almost completely resolved.  She notes today feeling better than she has in years.  She has mild intermittent fatigue.  Bowel function continues to normalize, almost back to her baseline.  She notes that stool is not a soft anymore.  She denies any vaginal bleeding or discharge.  She notes less control over her urine flow and has urinary incontinence with sneezing and coughing.  She is working on being more active and walking with her husband as well as doing yoga.  She endorses a good appetite without nausea or emesis.  Past Medical/Surgical History: Past Medical History:  Diagnosis Date   Abnormal Pap smear of cervix    Arthritis    Cervical cancer (HCC)    Depression    GAD (generalized anxiety disorder)    GERD (gastroesophageal reflux disease)    History of migraine    History of radiation therapy    cervix - pelvis  09/12/2021-10/14/2021 Dr Gery Pray   History of radiation therapy    cervix - HDR  brachy tandem and ring 10/25/2021-11/22/2021  Dr Gery Pray   History of transient ischemic attack (TIA) 01/2021   neruologist--- dr Leonie Man;  [facility]/ [facility]22 in [facility] ,  per note complicated migraine vs anxiety,  less likely TIA   HLD (hyperlipidemia)    Osteoporosis    Pain disorder    Scoliosis    Torn ACL  (anterior cruciate ligament) left hand 11/05/2021   wearing splint seeing ortho md week of 11-14-2021    Past Surgical History:  Procedure Laterality Date   CERVICAL CONIZATION W/BX N/A 08/04/2021   Procedure: CONIZATION CERVIX WITH BIOPSY: DILATION AND CURETTAGE OF UTERUS;  Surgeon: Sherlyn Hay, DO;  Location: [place];  Service: Gynecology;  Laterality: N/A;   CHOLECYSTECTOMY OPEN  2005   approx   IR IMAGING GUIDED PORT INSERTION  09/08/2021   IR REMOVAL TUN ACCESS W/ PORT W/O FL MOD SED  03/03/2022   OPERATIVE ULTRASOUND N/A 10/25/2021   Procedure: OPERATIVE ULTRASOUND;  Surgeon: Gery Pray, MD;  Location: [facility];  Service: Urology;  Laterality: N/A;   OPERATIVE ULTRASOUND N/A 11/03/2021   Procedure: OPERATIVE ULTRASOUND;  Surgeon: Gery Pray, MD;  Location: [facility];  Service: Urology;  Laterality: N/A;   OPERATIVE ULTRASOUND N/A 11/10/2021   Procedure: OPERATIVE ULTRASOUND;  Surgeon: Gery Pray, MD;  Location: [facility]  Service: Urology;  Laterality: N/A;   OPERATIVE ULTRASOUND N/A 11/18/2021   Procedure: OPERATIVE ULTRASOUND;  Surgeon: Gery Pray, MD;  Location: [facility];  Service: Urology;  Laterality: N/A;   OPERATIVE ULTRASOUND N/A 11/22/2021   Procedure: OPERATIVE ULTRASOUND;  Surgeon: Gery Pray, MD;  Location: [facility];  Service: Urology;  Laterality: N/A;   TANDEM RING INSERTION N/A 10/25/2021   Procedure: TANDEM RING INSERTION;  Surgeon: Gery Pray, MD;  Location: [facility];  Service: Urology;  Laterality: N/A;   TANDEM RING INSERTION N/A 11/03/2021   Procedure: TANDEM RING INSERTION;  Surgeon: Gery Pray, MD;  Location: [facility];  Service: Urology;  Laterality: N/A;   TANDEM RING INSERTION N/A 11/10/2021   Procedure: TANDEM RING INSERTION;  Surgeon: Gery Pray, MD;  Location: [facility];   Service: Urology;  Laterality: N/A;   TANDEM RING INSERTION N/A 11/18/2021   Procedure: TANDEM RING INSERTION;  Surgeon: Gery Pray, MD;  Location: [facility];  Service: Urology;  Laterality: N/A;   TANDEM RING INSERTION N/A 11/22/2021   Procedure: TANDEM RING INSERTION;  Surgeon: Gery Pray, MD;  Location: [facility];  Service: Urology;  Laterality: N/A;   TENDON REPAIR Left 2008   Ankle/ foot    Family History  Problem Relation Age of Onset   Mental illness Mother    Stroke Mother    Dementia Mother    Alzheimer's disease Mother    Bladder Cancer Father    Healthy Brother    Healthy Brother    Healthy Brother    CAD Maternal Grandmother    CAD Maternal Grandfather    Healthy Son    Healthy Son    Colon cancer Neg Hx    Breast cancer Neg Hx    Ovarian cancer Neg Hx    Endometrial cancer Neg Hx    Pancreatic cancer Neg Hx    Prostate cancer Neg Hx     Social History   Socioeconomic History   Marital status: Married    Spouse name: Not on file  Number of children: 2   Years of education: Not on file   Highest education level: Not on file  Occupational History    Employer: [employer]    Comment: retired   Occupation: retired  Tobacco Use   Smoking status: Former    Packs/day: 1.00    Years: 15.00    Total pack years: 15.00    Types: Cigarettes    Quit date: 1997    Years since quitting: 26.5   Smokeless tobacco: Never  Vaping Use   Vaping Use: Never used  Substance and Sexual Activity   Alcohol use: Yes    Alcohol/week: 3.0 standard drinks of alcohol    Types: 3 Standard drinks or equivalent per week    Comment: occ   Drug use: Never   Sexual activity: Yes    Birth control/protection: Post-menopausal  Other Topics Concern   Not on file  Social History Narrative   Work or School: Paediatric nurse [employer]      Home Situation: lives with husband      Spiritual Beliefs: Christian      Lifestyle: no regular  exercise; diet good            Social Determinants of Radio broadcast assistant Strain: Not on file  Food Insecurity: Not on file  Transportation Needs: Not on file  Physical Activity: Not on file  Stress: Not on file  Social Connections: Not on file    Current Medications:  Current Outpatient Medications:    alendronate (FOSAMAX) 70 MG tablet, Take 1 tablet (70 mg total) by mouth once a week. Take with a full glass of water on an empty stomach and sit upright for 30 minutes. (Patient taking differently: Take 70 mg by mouth every Sunday. Take with a full glass of water on an empty stomach and sit upright for 30 minutes. Sunday's), Disp: 12 tablet, Rfl: 3   aspirin EC 81 MG EC tablet, Take 1 tablet (81 mg total) by mouth daily. Swallow whole., Disp: 30 tablet, Rfl: 11   atorvastatin (LIPITOR) 40 MG tablet, Take 1 tablet (40 mg total) by mouth at bedtime., Disp: 90 tablet, Rfl: 3   Calcium Carbonate (CALCIUM-CARB 600 PO), Take 1 tablet by mouth daily., Disp: , Rfl:    cholecalciferol (VITAMIN D) 1000 UNITS tablet, Take 1,000 Units by mouth daily., Disp: , Rfl:    escitalopram (LEXAPRO) 10 MG tablet, Take 1 tablet (10 mg total) by mouth daily., Disp: 90 tablet, Rfl: 3   pantoprazole (PROTONIX) 20 MG tablet, Take 1 tablet (20 mg total) by mouth daily., Disp: 90 tablet, Rfl: 3  Review of Systems: Denies appetite changes, fevers, chills, fatigue, unexplained weight changes. Denies hearing loss, neck lumps or masses, mouth sores, ringing in ears or voice changes. Denies cough or wheezing.  Denies shortness of breath. Denies chest pain or palpitations. Denies leg swelling. Denies abdominal distention, pain, blood in stools, constipation, diarrhea, nausea, vomiting, or early satiety. Denies pain with intercourse, dysuria, frequency, hematuria. Denies hot flashes, pelvic pain, vaginal bleeding or vaginal discharge.   Denies joint pain, back pain or muscle pain/cramps. Denies itching, rash,  or wounds. Denies dizziness, headaches, numbness or seizures. Denies swollen lymph nodes or glands, denies easy bruising or bleeding. Denies anxiety, depression, confusion, or decreased concentration.  Physical Exam: BP 111/70 (BP Location: Left Arm, Patient Position: Sitting)   Pulse 77   Resp 18   Wt 167 lb 3 oz (75.8 kg)   LMP  08/21/2002   SpO2 99%   BMI 26.58 kg/m  General: Alert, oriented, no acute distress. HEENT: Normocephalic, atraumatic, sclera anicteric. Chest: Clear to auscultation bilaterally.  No wheezes or rhonchi. Cardiovascular: Regular rate and rhythm, no murmurs. Abdomen: soft, nontender.  Normoactive bowel sounds.  No masses or hepatosplenomegaly appreciated.   Extremities: Grossly normal range of motion.  Warm, well perfused.  No edema bilaterally. Skin: No rashes or lesions noted. Lymphatics: No cervical, supraclavicular, or inguinal adenopathy. GU: Normal appearing external genitalia without erythema, excoriation, or lesions.  Speculum exam reveals radiation changes at the vaginal apex and cervix, moderate atrophy also noted.  No visible lesions or masses.  No friable tissue noted.  Bimanual exam reveals cervix is flush with the vaginal apex, soft, no firmness or nodularity.  Rectovaginal exam confirms these findings, no parametrial nodularity or thickening.  Laboratory & Radiologic Studies: PET 7/5: IMPRESSION: Marked interval response to therapy with reduction of metabolic activity in the area of the cervix. Residual activity only minimally greater than mediastinal blood pool, attention on follow-up.   No signs of metastatic disease.  Assessment & Plan: Taylor Buchanan is a 66 y.o. woman with Stage IB2 SCC who presents for follow-up after completion of adjuvant chemoradiation.  Patient is doing very well and has almost recovered from side effects related to treatment.  She is NED on exam today.  Discussed her urinary symptoms and possible treatment options  including pelvic floor physical therapy or referral to urology/urogynecology for further work-up and discussion of treatment options such as pessary or surgical interventions.  The patient was interested in referral to pelvic floor physical therapy which was placed today.  We discussed surveillance.  We will plan on surveillance visits every 3 months, alternating between radiation oncology and our clinic.  Patient is scheduled to see Dr. Sondra Come in October.  I have asked her to call my office at the end of December to schedule a visit for January.  Discussed signs and symptoms that would be concerning for cancer recurrence, and I stressed the importance of calling if she develops any of these before her next visit.  We will plan to perform yearly cotesting.  28 minutes of total time was spent for this patient encounter, including preparation, face-to-face counseling with the patient and coordination of care, and documentation of the encounter.  Jeral Pinch, MD  Division of Gynecologic Oncology  Department of Obstetrics and Gynecology  [facility]

## 2022-03-22 ENCOUNTER — Other Ambulatory Visit: Payer: Self-pay | Admitting: Family Medicine

## 2022-03-22 DIAGNOSIS — M810 Age-related osteoporosis without current pathological fracture: Secondary | ICD-10-CM

## 2022-05-15 ENCOUNTER — Encounter: Payer: Self-pay | Admitting: *Deleted

## 2022-05-24 NOTE — Progress Notes (Signed)
Radiation Oncology         [phone] ________________________________  Name: Taylor Buchanan MRN: [id number]  Date: 05/25/2022  DOB: [birth year]  Follow-Up Visit Note  CC: Leamon Arnt, MD  Lafonda Mosses, MD  No diagnosis found.  Diagnosis:   The encounter diagnosis was Malignant neoplasm of cervix, unspecified site [facility]).   At least stage IB1 SCC grade 2 of the cervix (p16 positive)  Interval Since Last Radiation: 6 months and 1 day  Intent: Curative  Radiation Treatment Dates: 09/12/2021 through 11/22/2021   IMRT pelvis: 09/12/21 through 10/14/21   Brachytherapy cevix: 10/25/21 through 11/22/21   Site Technique Total Dose (Gy) Dose per Fx (Gy) Completed Fx Beam Energies  Pelvis: Pelvis IMRT 45/45 1.8 25/25 6X  Cervix: Cervix_Bst_Fx1 HDR-brachy 5.5/5.5 5.5 1/1 Ir-192  Cervix: Cervix_Bst_Fx2 HDR-brachy 5.5/5.5 5.5 1/1 Ir-192  Cervix: Cervix_Bst_Fx3 HDR-brachy 5.5/5.5 5.5 1/1 Ir-192  Cervix: Cervix_Bst_Fx4 HDR-brachy 5.5/5.5 5.5 1/1 Ir-192  Cervix: Cervix_Bst_Fx5 HDR-brachy 5.5/5.5 5.5 1/1 Ir-192    Narrative:  The patient returns today for routine 5 month follow-up. She was last seen here for follow-up on 12/22/21. Since her last visit, the patient followed up with Dr. Alvy Bimler on 02/24/22. During which time, the patient denied any concerns and consented to having her port removed. (Removed on 03/03/22).  During her most recent follow-up visit with Dr. Berline Lopes on 03/20/22. During which time, the patient endorsed intermittent fatigue but denied any symptoms concerning for disease recurrence and was noted as NED on examination. The patient did report less control over her urine flow and urinary incontinence with sneezing and coughing. For which Dr. Berline Lopes placed a referral for pelvic floor therapy.   Pertinent imaging performed in the interval includes a restaging PET scan on 02/22/22 which showed a marked interval response to therapy with reduction of metabolic activity in  the area of the cervix. Residual activity appreciated was seen as only minimally greater than mediastinal blood pool. PET also showed no signs of metastatic disease.     Allergies:  has No Known Allergies.  Meds: Current Outpatient Medications  Medication Sig Dispense Refill   alendronate (FOSAMAX) 70 MG tablet Take 1 tablet (70 mg total) by mouth once a week. Take with a full glass of water on an empty stomach and sit upright for 30 minutes. (Patient taking differently: Take 70 mg by mouth every Sunday. Take with a full glass of water on an empty stomach and sit upright for 30 minutes. Sunday's) 12 tablet 3   aspirin EC 81 MG EC tablet Take 1 tablet (81 mg total) by mouth daily. Swallow whole. 30 tablet 11   atorvastatin (LIPITOR) 40 MG tablet Take 1 tablet (40 mg total) by mouth at bedtime. 90 tablet 3   Calcium Carbonate (CALCIUM-CARB 600 PO) Take 1 tablet by mouth daily.     cholecalciferol (VITAMIN D) 1000 UNITS tablet Take 1,000 Units by mouth daily.     escitalopram (LEXAPRO) 10 MG tablet Take 1 tablet (10 mg total) by mouth daily. 90 tablet 3   pantoprazole (PROTONIX) 20 MG tablet Take 1 tablet (20 mg total) by mouth daily. 90 tablet 3   No current facility-administered medications for this encounter.    Physical Findings: The patient is in no acute distress. Patient is alert and oriented.  vitals were not taken for this visit. .  No significant changes. Lungs are clear to auscultation bilaterally. Heart has regular rate and rhythm. No palpable cervical, supraclavicular, or axillary adenopathy. Abdomen  soft, non-tender, normal bowel sounds.  On pelvic examination the external genitalia were unremarkable. A speculum exam was performed. There are no mucosal lesions noted in the vaginal vault. A Pap smear was obtained of the proximal vagina. On bimanual and rectovaginal examination there were no pelvic masses appreciated. ***    Lab Findings: Lab Results  Component Value Date   WBC  4.2 02/22/2022   HGB 14.1 02/22/2022   HCT 40.1 02/22/2022   MCV 97.3 02/22/2022   PLT 249 02/22/2022    Radiographic Findings: No results found.  Impression: The encounter diagnosis was Malignant neoplasm of cervix, unspecified site [facility]).   At least stage IB1 SCC grade 2 of the cervix (p16 positive)  The patient is recovering from the effects of radiation.  ***  Plan:  ***   *** minutes of total time was spent for this patient encounter, including preparation, face-to-face counseling with the patient and coordination of care, physical exam, and documentation of the encounter. ____________________________________  Blair Promise, PhD, MD  This document serves as a record of services personally performed by Gery Pray, MD. It was created on his behalf by Roney Mans, a trained medical scribe. The creation of this record is based on the scribe's personal observations and the provider's statements to them. This document has been checked and approved by the attending provider.

## 2022-05-25 ENCOUNTER — Encounter: Payer: Self-pay | Admitting: Radiation Oncology

## 2022-05-25 ENCOUNTER — Other Ambulatory Visit: Payer: Self-pay

## 2022-05-25 ENCOUNTER — Ambulatory Visit
Admission: RE | Admit: 2022-05-25 | Discharge: 2022-05-25 | Disposition: A | Discharge status: Home / Self Care | Payer: Medicare Other | Source: Ambulatory Visit | Attending: Radiation Oncology | Admitting: Radiation Oncology

## 2022-05-25 VITALS — BP 121/78 | HR 67 | Temp 97.3°F | Resp 18 | Ht 66.5 in | Wt 166.8 lb

## 2022-05-25 DIAGNOSIS — Z7982 Long term (current) use of aspirin: Secondary | ICD-10-CM | POA: Diagnosis not present

## 2022-05-25 DIAGNOSIS — C539 Malignant neoplasm of cervix uteri, unspecified: Secondary | ICD-10-CM

## 2022-05-25 DIAGNOSIS — R197 Diarrhea, unspecified: Secondary | ICD-10-CM | POA: Insufficient documentation

## 2022-05-25 DIAGNOSIS — R5383 Other fatigue: Secondary | ICD-10-CM | POA: Diagnosis not present

## 2022-05-25 DIAGNOSIS — Z923 Personal history of irradiation: Secondary | ICD-10-CM | POA: Diagnosis not present

## 2022-05-25 DIAGNOSIS — Z79899 Other long term (current) drug therapy: Secondary | ICD-10-CM | POA: Diagnosis not present

## 2022-05-25 DIAGNOSIS — Z8541 Personal history of malignant neoplasm of cervix uteri: Secondary | ICD-10-CM | POA: Insufficient documentation

## 2022-05-25 NOTE — Progress Notes (Signed)
Taylor Buchanan is here today for follow up post radiation to the pelvic.  They completed their radiation on: 11/22/21  Does the patient complain of any of the following:  Pain: Reports mild "twinges" to pelvic area at times.  Abdominal bloating: No Diarrhea/Constipation: Continues to have soft stools.  Nausea/Vomiting: No Vaginal Discharge: No  Blood in Urine or Stool: No Urinary Issues (dysuria/incomplete emptying/ incontinence/ increased frequency/urgency):No  Does patient report using vaginal dilator 2-3 times a week and/or sexually active 2-3 weeks: Yes, patient is sexually active. Post radiation skin changes: No   Additional comments if applicable:   BP 492/01 (BP Location: Left Arm, Patient Position: Sitting, Cuff Size: Normal)   Pulse 67   Temp (!) 97.3 F (36.3 C)   Resp 18   Ht 5' 6.5" (1.689 m)   Wt 166 lb 12.8 oz (75.7 kg)   LMP 08/21/2002   SpO2 99%   BMI 26.52 kg/m

## 2022-05-29 ENCOUNTER — Encounter: Payer: Self-pay | Admitting: Podiatry

## 2022-05-29 ENCOUNTER — Ambulatory Visit (INDEPENDENT_AMBULATORY_CARE_PROVIDER_SITE_OTHER): Payer: Medicare Other

## 2022-05-29 ENCOUNTER — Ambulatory Visit: Payer: Medicare Other | Admitting: Podiatry

## 2022-05-29 DIAGNOSIS — M722 Plantar fascial fibromatosis: Secondary | ICD-10-CM

## 2022-05-29 DIAGNOSIS — M79672 Pain in left foot: Secondary | ICD-10-CM

## 2022-05-30 ENCOUNTER — Ambulatory Visit (INDEPENDENT_AMBULATORY_CARE_PROVIDER_SITE_OTHER): Payer: Medicare Other | Admitting: *Deleted

## 2022-05-30 DIAGNOSIS — M722 Plantar fascial fibromatosis: Secondary | ICD-10-CM | POA: Diagnosis not present

## 2022-05-30 DIAGNOSIS — Z23 Encounter for immunization: Secondary | ICD-10-CM

## 2022-05-30 MED ORDER — TRIAMCINOLONE ACETONIDE 10 MG/ML IJ SUSP
10.0000 mg | Freq: Once | INTRAMUSCULAR | Status: AC
  Administered 2022-05-30: 10 mg

## 2022-05-30 NOTE — Progress Notes (Signed)
Subjective:   Patient ID: Taylor Buchanan, female   DOB: 66 y.o.   MRN: [id number]   HPI Patient states she is developed severe pain in her left heel and she is due to go out of town and is supposed to do a lot of walking neuro vascular   ROS      Objective:  Physical Exam  Neurovascular status intact muscle strength found to be adequate with patient found to have exquisite discomfort plantar aspect left heel at the insertional point tendon calcaneus with inflammation fluid around the medial band.  Patient is noted to have good digital perfusion well-oriented x3     Assessment:  Acute plantar fasciitis left with inflammation fluid around the medial band     Plan:  H&P x-rays reviewed went ahead today did sterile prep injected the fascia 3 mg Kenalog 5 mg Xylocaine at insertion and went ahead and casted for new orthotic devices for the long-term.  Patient will be seen back to recheck  X-rays indicate that there is small spur no indication stress fracture arthritis

## 2022-06-14 ENCOUNTER — Encounter: Payer: Self-pay | Admitting: Podiatry

## 2022-06-14 ENCOUNTER — Ambulatory Visit: Payer: Medicare Other | Admitting: Podiatry

## 2022-06-14 DIAGNOSIS — M722 Plantar fascial fibromatosis: Secondary | ICD-10-CM | POA: Diagnosis not present

## 2022-06-14 MED ORDER — TRIAMCINOLONE ACETONIDE 10 MG/ML IJ SUSP
10.0000 mg | Freq: Once | INTRAMUSCULAR | Status: AC
  Administered 2022-06-14: 10 mg

## 2022-06-14 NOTE — Progress Notes (Signed)
Subjective:   Patient ID: Taylor Buchanan, female   DOB: 66 y.o.   MRN: [id number]   HPI Patient presents with pain in the left heel still but it is more on the lateral side now than the medial side   ROS      Objective:  Physical Exam  Acute fasciitis symptomatology left lateral plantar heel improved on the medial     Assessment:  Hopefully compensatory Planter fasciitis left     Plan:  Sterile prep and from the lateral side I injected 3 mg Dexasone Kenalog 5 mg Xylocaine and advised on heel type shoe.  Reappoint as symptoms indicate

## 2022-06-15 ENCOUNTER — Ambulatory Visit: Payer: Medicare Other | Admitting: Podiatry

## 2022-06-19 ENCOUNTER — Telehealth: Payer: Self-pay | Admitting: Podiatry

## 2022-06-19 NOTE — Telephone Encounter (Signed)
Lvm for patient to call the office an schedule a appointment for opu

## 2022-06-20 ENCOUNTER — Telehealth: Payer: Self-pay | Admitting: Podiatry

## 2022-06-20 NOTE — Telephone Encounter (Signed)
lvm for patient to contact office to sched appointment  for opu

## 2022-06-28 ENCOUNTER — Ambulatory Visit (INDEPENDENT_AMBULATORY_CARE_PROVIDER_SITE_OTHER): Payer: Medicare Other

## 2022-06-28 DIAGNOSIS — M722 Plantar fascial fibromatosis: Secondary | ICD-10-CM

## 2022-06-28 NOTE — Progress Notes (Signed)
Patient presents today to pick up custom molded foot orthotics, diagnosed with plantar by Dr. Paulla Dolly.   Orthotics were dispensed and fit was satisfactory. Reviewed instructions for break-in and wear. Written instructions given to patient.  Patient will follow up as needed.  Patient is considering ordering a 2nd pair but would like them in a 3/4 Length instead of full length.    [facility] - order # [id number]

## 2022-06-29 ENCOUNTER — Encounter: Payer: Self-pay | Admitting: Podiatry

## 2022-07-03 NOTE — Telephone Encounter (Signed)
Please tell Taylor Buchanan to order a 2nd pair of orthotics 3/4 length. Let me know if you don't understand what she needs

## 2022-07-06 ENCOUNTER — Telehealth: Payer: Self-pay | Admitting: *Deleted

## 2022-07-06 NOTE — Telephone Encounter (Signed)
Left the patient message to call the office back. Patient needs a follow up appt with Dr Berline Lopes in January

## 2022-07-11 NOTE — Telephone Encounter (Signed)
Spoke with the patient and scheduled a follow up appt for 1/18

## 2022-08-10 ENCOUNTER — Telehealth: Payer: Self-pay | Admitting: *Deleted

## 2022-08-10 NOTE — Telephone Encounter (Signed)
Rescheduled appt from 1/18 to 1/19

## 2022-08-15 ENCOUNTER — Other Ambulatory Visit: Payer: Self-pay | Admitting: Family Medicine

## 2022-08-15 ENCOUNTER — Encounter: Payer: Self-pay | Admitting: Family Medicine

## 2022-08-15 MED ORDER — ALENDRONATE SODIUM 70 MG PO TABS: 70.0000 mg | ORAL_TABLET | ORAL | 1 refills | Status: DC

## 2022-08-17 ENCOUNTER — Telehealth: Payer: Self-pay | Admitting: Family Medicine

## 2022-08-17 NOTE — Telephone Encounter (Signed)
Copied from [place] [id number]. Topic: Medicare AWV >> Aug 17, 2022  9:23 AM Gillis Santa wrote: Reason for CRM: lvm PT TO CALL [place]

## 2022-08-22 ENCOUNTER — Other Ambulatory Visit: Payer: Self-pay | Admitting: Family Medicine

## 2022-08-25 NOTE — Telephone Encounter (Addendum)
Left message on voicemail for patient to call back, her orthotics are in.

## 2022-09-04 ENCOUNTER — Encounter: Payer: Self-pay | Admitting: Internal Medicine

## 2022-09-06 ENCOUNTER — Encounter: Payer: Self-pay | Admitting: Gynecologic Oncology

## 2022-09-07 ENCOUNTER — Ambulatory Visit
Admission: RE | Admit: 2022-09-07 | Discharge: 2022-09-07 | Disposition: A | Discharge status: Home / Self Care | Payer: Medicare Other | Source: Ambulatory Visit | Attending: Family Medicine | Admitting: Family Medicine

## 2022-09-07 ENCOUNTER — Ambulatory Visit: Payer: Medicare Other | Admitting: Gynecologic Oncology

## 2022-09-07 DIAGNOSIS — M810 Age-related osteoporosis without current pathological fracture: Secondary | ICD-10-CM

## 2022-09-08 ENCOUNTER — Inpatient Hospital Stay: Payer: Medicare Other | Attending: Gynecologic Oncology | Admitting: Gynecologic Oncology

## 2022-09-08 ENCOUNTER — Encounter: Payer: Self-pay | Admitting: Gynecologic Oncology

## 2022-09-08 ENCOUNTER — Ambulatory Visit (INDEPENDENT_AMBULATORY_CARE_PROVIDER_SITE_OTHER): Payer: Medicare Other

## 2022-09-08 VITALS — BP 109/77 | HR 65 | Temp 98.6°F | Resp 14 | Wt 172.0 lb

## 2022-09-08 VITALS — Wt 169.0 lb

## 2022-09-08 DIAGNOSIS — M810 Age-related osteoporosis without current pathological fracture: Secondary | ICD-10-CM | POA: Diagnosis not present

## 2022-09-08 DIAGNOSIS — Z8541 Personal history of malignant neoplasm of cervix uteri: Secondary | ICD-10-CM

## 2022-09-08 DIAGNOSIS — Z923 Personal history of irradiation: Secondary | ICD-10-CM | POA: Diagnosis not present

## 2022-09-08 DIAGNOSIS — Z0000 Encounter for general adult medical examination without abnormal findings: Secondary | ICD-10-CM

## 2022-09-08 DIAGNOSIS — Z9221 Personal history of antineoplastic chemotherapy: Secondary | ICD-10-CM | POA: Insufficient documentation

## 2022-09-08 DIAGNOSIS — N393 Stress incontinence (female) (male): Secondary | ICD-10-CM

## 2022-09-08 DIAGNOSIS — C539 Malignant neoplasm of cervix uteri, unspecified: Secondary | ICD-10-CM

## 2022-09-08 NOTE — Patient Instructions (Signed)
Taylor Buchanan , Thank you for taking time to come for your Medicare Wellness Visit. I appreciate your ongoing commitment to your health goals. Please review the following plan we discussed and let me know if I can assist you in the future.   These are the goals we discussed:  Goals      Patient Stated     Stay healthy and active         This is a list of the screening recommended for you and due dates:  Health Maintenance  Topic Date Due   Pneumonia Vaccine (1 - PCV) Never done   COVID-19 Vaccine (4 - 2023-24 season) 04/21/2022   Mammogram  05/11/2022   Colon Cancer Screening  03/22/2023   DTaP/Tdap/Td vaccine (2 - Td or Tdap) 09/03/2023   Medicare Annual Wellness Visit  09/09/2023   DEXA scan (bone density measurement)  09/07/2025   Flu Shot  Completed   Hepatitis C Screening: USPSTF Recommendation to screen - Ages 18-79 yo.  Completed   Zoster (Shingles) Vaccine  Completed   HPV Vaccine  Aged Out    Advanced directives: Advance directive discussed with you today. Even though you declined this today please call our office should you change your mind and we can give you the proper paperwork for you to fill out.   Conditions/risks identified: stay healthy and active   Next appointment: Follow up in one year for your annual wellness visit    Preventive Care 65 Years and Older, Female Preventive care refers to lifestyle choices and visits with your health care provider that can promote health and wellness. What does preventive care include? A yearly physical exam. This is also called an annual well check. Dental exams once or twice a year. Routine eye exams. Ask your health care provider how often you should have your eyes checked. Personal lifestyle choices, including: Daily care of your teeth and gums. Regular physical activity. Eating a healthy diet. Avoiding tobacco and drug use. Limiting alcohol use. Practicing safe sex. Taking low-dose aspirin every day. Taking vitamin  and mineral supplements as recommended by your health care provider. What happens during an annual well check? The services and screenings done by your health care provider during your annual well check will depend on your age, overall health, lifestyle risk factors, and family history of disease. Counseling  Your health care provider may ask you questions about your: Alcohol use. Tobacco use. Drug use. Emotional well-being. Home and relationship well-being. Sexual activity. Eating habits. History of falls. Memory and ability to understand (cognition). Work and work Statistician. Reproductive health. Screening  You may have the following tests or measurements: Height, weight, and BMI. Blood pressure. Lipid and cholesterol levels. These may be checked every 5 years, or more frequently if you are over 59 years old. Skin check. Lung cancer screening. You may have this screening every year starting at age 17 if you have a 30-pack-year history of smoking and currently smoke or have quit within the past 15 years. Fecal occult blood test (FOBT) of the stool. You may have this test every year starting at age 19. Flexible sigmoidoscopy or colonoscopy. You may have a sigmoidoscopy every 5 years or a colonoscopy every 10 years starting at age 25. Hepatitis C blood test. Hepatitis B blood test. Sexually transmitted disease (STD) testing. Diabetes screening. This is done by checking your blood sugar (glucose) after you have not eaten for a while (fasting). You may have this done every 1-3 years. Bone  density scan. This is done to screen for osteoporosis. You may have this done starting at age 75. Mammogram. This may be done every 1-2 years. Talk to your health care provider about how often you should have regular mammograms. Talk with your health care provider about your test results, treatment options, and if necessary, the need for more tests. Vaccines  Your health care provider may recommend  certain vaccines, such as: Influenza vaccine. This is recommended every year. Tetanus, diphtheria, and acellular pertussis (Tdap, Td) vaccine. You may need a Td booster every 10 years. Zoster vaccine. You may need this after age 26. Pneumococcal 13-valent conjugate (PCV13) vaccine. One dose is recommended after age 13. Pneumococcal polysaccharide (PPSV23) vaccine. One dose is recommended after age 74. Talk to your health care provider about which screenings and vaccines you need and how often you need them. This information is not intended to replace advice given to you by your health care provider. Make sure you discuss any questions you have with your health care provider. Document Released: 09/03/2015 Document Revised: 04/26/2016 Document Reviewed: 06/08/2015 Elsevier Interactive Patient Education  2017 [place] Prevention in the Home Falls can cause injuries. They can happen to people of all ages. There are many things you can do to make your home safe and to help prevent falls. What can I do on the outside of my home? Regularly fix the edges of walkways and driveways and fix any cracks. Remove anything that might make you trip as you walk through a door, such as a raised step or threshold. Trim any bushes or trees on the path to your home. Use bright outdoor lighting. Clear any walking paths of anything that might make someone trip, such as rocks or tools. Regularly check to see if handrails are loose or broken. Make sure that both sides of any steps have handrails. Any raised decks and porches should have guardrails on the edges. Have any leaves, snow, or ice cleared regularly. Use sand or salt on walking paths during winter. Clean up any spills in your garage right away. This includes oil or grease spills. What can I do in the bathroom? Use night lights. Install grab bars by the toilet and in the tub and shower. Do not use towel bars as grab bars. Use non-skid mats or  decals in the tub or shower. If you need to sit down in the shower, use a plastic, non-slip stool. Keep the floor dry. Clean up any water that spills on the floor as soon as it happens. Remove soap buildup in the tub or shower regularly. Attach bath mats securely with double-sided non-slip rug tape. Do not have throw rugs and other things on the floor that can make you trip. What can I do in the bedroom? Use night lights. Make sure that you have a light by your bed that is easy to reach. Do not use any sheets or blankets that are too big for your bed. They should not hang down onto the floor. Have a firm chair that has side arms. You can use this for support while you get dressed. Do not have throw rugs and other things on the floor that can make you trip. What can I do in the kitchen? Clean up any spills right away. Avoid walking on wet floors. Keep items that you use a lot in easy-to-reach places. If you need to reach something above you, use a strong step stool that has a grab bar. Keep  electrical cords out of the way. Do not use floor polish or wax that makes floors slippery. If you must use wax, use non-skid floor wax. Do not have throw rugs and other things on the floor that can make you trip. What can I do with my stairs? Do not leave any items on the stairs. Make sure that there are handrails on both sides of the stairs and use them. Fix handrails that are broken or loose. Make sure that handrails are as long as the stairways. Check any carpeting to make sure that it is firmly attached to the stairs. Fix any carpet that is loose or worn. Avoid having throw rugs at the top or bottom of the stairs. If you do have throw rugs, attach them to the floor with carpet tape. Make sure that you have a light switch at the top of the stairs and the bottom of the stairs. If you do not have them, ask someone to add them for you. What else can I do to help prevent falls? Wear shoes that: Do not  have high heels. Have rubber bottoms. Are comfortable and fit you well. Are closed at the toe. Do not wear sandals. If you use a stepladder: Make sure that it is fully opened. Do not climb a closed stepladder. Make sure that both sides of the stepladder are locked into place. Ask someone to hold it for you, if possible. Clearly mark and make sure that you can see: Any grab bars or handrails. First and last steps. Where the edge of each step is. Use tools that help you move around (mobility aids) if they are needed. These include: Canes. Walkers. Scooters. Crutches. Turn on the lights when you go into a dark area. Replace any light bulbs as soon as they burn out. Set up your furniture so you have a clear path. Avoid moving your furniture around. If any of your floors are uneven, fix them. If there are any pets around you, be aware of where they are. Review your medicines with your doctor. Some medicines can make you feel dizzy. This can increase your chance of falling. Ask your doctor what other things that you can do to help prevent falls. This information is not intended to replace advice given to you by your health care provider. Make sure you discuss any questions you have with your health care provider. Document Released: 06/03/2009 Document Revised: 01/13/2016 Document Reviewed: 09/11/2014 Elsevier Interactive Patient Education  2017 [employer].

## 2022-09-08 NOTE — Progress Notes (Signed)
I connected with  Taylor Buchanan on 09/08/22 by a audio enabled telemedicine application and verified that I am speaking with the correct person using two identifiers.  Patient Location: Home  Provider Location: Office/Clinic  I discussed the limitations of evaluation and management by telemedicine. The patient expressed understanding and agreed to proceed.  Subjective:   Taylor Buchanan is a 67 y.o. female who presents for an Initial Medicare Annual Wellness Visit.  Review of Systems     Cardiac Risk Factors include: advanced age (>31mn, >>80women);dyslipidemia     Objective:    Today's Vitals   09/08/22 1256  Weight: 169 lb (76.7 kg)   Body mass index is 26.87 kg/m.     09/08/2022   12:59 PM 05/25/2022   11:37 AM 12/22/2021   10:50 AM 11/22/2021    9:07 AM 11/18/2021    6:12 AM 11/10/2021    9:19 AM 11/06/2021   10:41 AM  Advanced Directives  Does Patient Have a Medical Advance Directive? No No No No No No No  Would patient like information on creating a medical advance directive? No - Patient declined No - Patient declined No - Patient declined No - Patient declined No - Patient declined No - Patient declined     Current Medications (verified) Outpatient Encounter Medications as of 09/08/2022  Medication Sig   alendronate (FOSAMAX) 70 MG tablet Take 1 tablet (70 mg total) by mouth once a week. Take with a full glass of water on an empty stomach and sit upright for 30 minutes.   aspirin EC 81 MG EC tablet Take 1 tablet (81 mg total) by mouth daily. Swallow whole.   atorvastatin (LIPITOR) 40 MG tablet TAKE 1 TABLET BY MOUTH AT  BEDTIME   Calcium Carbonate (CALCIUM-CARB 600 PO) Take 1 tablet by mouth daily.   cholecalciferol (VITAMIN D) 1000 UNITS tablet Take 1,000 Units by mouth daily.   escitalopram (LEXAPRO) 10 MG tablet TAKE 1 TABLET BY MOUTH DAILY   pantoprazole (PROTONIX) 20 MG tablet TAKE 1 TABLET BY MOUTH DAILY   No facility-administered encounter medications on file as of  09/08/2022.    Allergies (verified) Patient has no known allergies.   History: Past Medical History:  Diagnosis Date   Abnormal Pap smear of cervix    Arthritis    Cervical cancer (HCC)    Depression    GAD (generalized anxiety disorder)    GERD (gastroesophageal reflux disease)    History of migraine    History of radiation therapy    cervix - pelvis  09/12/2021-10/14/2021 Dr JGery Pray  History of radiation therapy    cervix - HDR brachy tandem and ring 10/25/2021-11/22/2021  Dr JGery Pray  History of transient ischemic attack (TIA) 01/2021   neruologist--- dr sLeonie Man  [facility]/ 2022 in [facility] ,  per note complicated migraine vs anxiety,  less likely TIA   HLD (hyperlipidemia)    Osteoporosis    Pain disorder    Scoliosis    Torn ACL (anterior cruciate ligament) left hand 11/05/2021   wearing splint seeing ortho md week of 11-14-2021   Past Surgical History:  Procedure Laterality Date   CERVICAL CONIZATION W/BX N/A 08/04/2021   Procedure: CONIZATION CERVIX WITH BIOPSY: DILATION AND CURETTAGE OF UTERUS;  Surgeon: BSherlyn Hay DO;  Location[place] [facility]  Service: Gynecology;  Laterality: N/A;   CHOLECYSTECTOMY OPEN  2005   approx   IR IMAGING GUIDED PORT INSERTION  09/08/2021  IR REMOVAL TUN ACCESS W/ PORT W/O FL MOD SED  03/03/2022   OPERATIVE ULTRASOUND N/A 10/25/2021   Procedure: OPERATIVE ULTRASOUND;  Surgeon: Gery Pray, MD;  Location: [facility];  Service: Urology;  Laterality: N/A;   OPERATIVE ULTRASOUND N/A 11/03/2021   Procedure: OPERATIVE ULTRASOUND;  Surgeon: Gery Pray, MD;  Location: [facility];  Service: Urology;  Laterality: N/A;   OPERATIVE ULTRASOUND N/A 11/10/2021   Procedure: OPERATIVE ULTRASOUND;  Surgeon: Gery Pray, MD;  Location: [facility];  Service: Urology;  Laterality: N/A;   OPERATIVE ULTRASOUND N/A 11/18/2021   Procedure: OPERATIVE ULTRASOUND;  Surgeon: Gery Pray,  MD;  Location: [facility];  Service: Urology;  Laterality: N/A;   OPERATIVE ULTRASOUND N/A 11/22/2021   Procedure: OPERATIVE ULTRASOUND;  Surgeon: Gery Pray, MD;  Location: [place];  Service: Urology;  Laterality: N/A;   TANDEM RING INSERTION N/A 10/25/2021   Procedure: TANDEM RING INSERTION;  Surgeon: Gery Pray, MD;  Location[facility];  Service: Urology;  Laterality: N/A;   TANDEM RING INSERTION N/A 11/03/2021   Procedure: TANDEM RING INSERTION;  Surgeon: Gery Pray, MD;  Location: [facility];  Service: Urology;  Laterality: N/A;   TANDEM RING INSERTION N/A 11/10/2021   Procedure: TANDEM RING INSERTION;  Surgeon: Gery Pray, MD;  Location: [facility];  Service: Urology;  Laterality: N/A;   TANDEM RING INSERTION N/A 11/18/2021   Procedure: TANDEM RING INSERTION;  Surgeon: Gery Pray, MD;  Location: [facility];  Service: Urology;  Laterality: N/A;   TANDEM RING INSERTION N/A 11/22/2021   Procedure: TANDEM RING INSERTION;  Surgeon: Gery Pray, MD;  Location: [facility];  Service: Urology;  Laterality: N/A;   TENDON REPAIR Left 2008   Ankle/ foot   Family History  Problem Relation Age of Onset   Mental illness Mother    Stroke Mother    Dementia Mother    Alzheimer's disease Mother    Bladder Cancer Father    Healthy Brother    Healthy Brother    Healthy Brother    CAD Maternal Grandmother    CAD Maternal Grandfather    Healthy Son    Healthy Son    Colon cancer Neg Hx    Breast cancer Neg Hx    Ovarian cancer Neg Hx    Endometrial cancer Neg Hx    Pancreatic cancer Neg Hx    Prostate cancer Neg Hx    Social History   Socioeconomic History   Marital status: Married    Spouse name: Not on file   Number of children: 2   Years of education: Not on file   Highest education level: Not on file  Occupational History    Employer: [employer]     Comment: retired   Occupation: retired  Tobacco Use   Smoking status: Former    Packs/day: 1.00    Years: 15.00    Total pack years: 15.00    Types: Cigarettes    Quit date: 1997    Years since quitting: 27.0   Smokeless tobacco: Never  Vaping Use   Vaping Use: Never used  Substance and Sexual Activity   Alcohol use: Yes    Alcohol/week: 3.0 standard drinks of alcohol    Types: 3 Standard drinks or equivalent per week    Comment: occ   Drug use: Never   Sexual activity: Yes    Birth control/protection:  Post-menopausal  Other Topics Concern   Not on file  Social History Narrative   Work or School: Paediatric nurse [employer]      Home Situation: lives with husband      Spiritual Beliefs: Christian      Lifestyle: no regular exercise; diet good            Social Determinants of Health   Financial Resource Strain: Not on file  Food Insecurity: Not on file  Transportation Needs: Not on file  Physical Activity: Not on file  Stress: Not on file  Social Connections: Unknown (09/08/2022)   Social Connection and Isolation Panel [NHANES]    Frequency of Communication with Friends and Family: Not on file    Frequency of Social Gatherings with Friends and Family: Not on file    Attends Religious Services: 1 to 4 times per year    Active Member of [employer] or Organizations: Not on file    Attends Archivist Meetings: Not on file    Marital Status: Not on file    Tobacco Counseling Counseling given: Not Answered   Clinical Intake:  Pre-visit preparation completed: Yes  Pain : No/denies pain     BMI - recorded: 26.87 Nutritional Status: BMI 25 -29 Overweight Nutritional Risks: None Diabetes: No  How often do you need to have someone help you when you read instructions, pamphlets, or other written materials from your doctor or pharmacy?: 1 - Never  Diabetic?no  Interpreter Needed?: No  Information entered by :: Charlott Rakes, LPN   Activities of  Daily Living    09/08/2022   12:03 PM 11/22/2021    9:15 AM  In your present state of health, do you have any difficulty performing the following activities:  Hearing? 0 0  Vision? 0 0  Difficulty concentrating or making decisions? 0 0  Walking or climbing stairs? 0 0  Dressing or bathing? 0 0  Doing errands, shopping? 0   Preparing Food and eating ? N   Using the Toilet? N   In the past six months, have you accidently leaked urine? Y   Comment pt wears a pad at times   Do you have problems with loss of bowel control? N   Managing your Medications? N   Managing your Finances? N   Housekeeping or managing your Housekeeping? N     Patient Care Team: Leamon Arnt, MD as PCP - General (Family Medicine) Key, Nelia Shi, NP as Nurse Practitioner (Gynecology) Lyndee Hensen, PT as Physical Therapist (Physical Therapy) Heath Lark, MD as Consulting Physician (Hematology and Oncology) Sharyn Creamer, MD as Consulting Physician (Gastroenterology)  Indicate any recent Medical Services you may have received from other than [facility] providers in the past year (date may be approximate).     Assessment:   This is a routine wellness examination for [place].  Hearing/Vision screen Hearing Screening - Comments:: Pt denies any hearing issues  Vision Screening - Comments:: Pt follows up with [facility] for annual eye exams   Dietary issues and exercise activities discussed: Current Exercise Habits: Home exercise routine, Type of exercise: yoga, Time (Minutes): 60, Frequency (Times/Week): 1, Weekly Exercise (Minutes/Week): 60   Goals Addressed             This Visit's Progress    Patient Stated       Stay healthy and active        Depression Screen    09/08/2022    1:00 PM  02/08/2022    1:04 PM 03/15/2021    9:39 AM 04/18/2019    3:38 PM  PHQ 2/9 Scores  PHQ - 2 Score 0 0 0 0    Fall Risk    09/08/2022   12:03 PM 02/08/2022    1:04 PM 08/29/2019    9:02 AM  Fall Risk    Falls in the past year? 0 0 0  Number falls in past yr: 0 0   Injury with Fall? 0 0   Risk for fall due to : Impaired vision No Fall Risks   Follow up Falls prevention discussed Falls evaluation completed Falls evaluation completed    FALL RISK PREVENTION PERTAINING TO THE HOME:  Any stairs in or around the home? Yes  If so, are there any without handrails? No  Home free of loose throw rugs in walkways, pet beds, electrical cords, etc? Yes  Adequate lighting in your home to reduce risk of falls? Yes   ASSISTIVE DEVICES UTILIZED TO PREVENT FALLS:  Life alert? No  Use of a cane, walker or w/c? No  Grab bars in the bathroom? Yes  Shower chair or bench in shower? No  Elevated toilet seat or a handicapped toilet? No   TIMED UP AND GO:  Was the test performed? No .  Cognitive Function: pt declined at this time         Immunizations Immunization History  Administered Date(s) Administered   Fluad Quad(high Dose 65+) 05/30/2022   Influenza,inj,Quad PF,6+ Mos 04/18/2019, 08/05/2020, 05/31/2021   PFIZER(Purple Top)SARS-COV-2 Vaccination 09/26/2019, 10/30/2019, 11/20/2019   Tdap 09/02/2013   Zoster Recombinat (Shingrix) 08/29/2019, 02/26/2020    TDAP status: Up to date  Flu Vaccine status: Up to date  Pneumococcal vaccine status: Due, Education has been provided regarding the importance of this vaccine. Advised may receive this vaccine at local pharmacy or Health Dept. Aware to provide a copy of the vaccination record if obtained from local pharmacy or Health Dept. Verbalized acceptance and understanding.  Covid-19 vaccine status: Completed vaccines  Qualifies for Shingles Vaccine? Yes   Zostavax completed Yes   Shingrix Completed?: Yes  Screening Tests Health Maintenance  Topic Date Due   Pneumonia Vaccine 34+ Years old (1 - PCV) Never done   COVID-19 Vaccine (4 - 2023-24 season) 04/21/2022   MAMMOGRAM  05/11/2022   COLONOSCOPY (Pts 45-96yr Insurance coverage will  need to be confirmed)  03/22/2023   DTaP/Tdap/Td (2 - Td or Tdap) 09/03/2023   Medicare Annual Wellness (AWV)  09/09/2023   DEXA SCAN  09/07/2025   INFLUENZA VACCINE  Completed   Hepatitis C Screening  Completed   Zoster Vaccines- Shingrix  Completed   HPV VACCINES  Aged Out    Health Maintenance  Health Maintenance Due  Topic Date Due   Pneumonia Vaccine 67 Years old (1 - PCV) Never done   COVID-19 Vaccine (4 - 2023-24 season) 04/21/2022   MAMMOGRAM  05/11/2022    Colorectal cancer screening: Type of screening: Colonoscopy. Completed 03/21/13. Repeat every 10 years  Mammogram status: Completed 05/11/21. Repeat every year  Bone Density status: Completed 09/07/22. Results reflect: Bone density results: OSTEOPENIA. Repeat every 3 years.  Additional Screening:  Hepatitis C Screening:  Completed 08/29/19  Vision Screening: Recommended annual ophthalmology exams for early detection of glaucoma and other disorders of the eye. Is the patient up to date with their annual eye exam?  Yes  Who is the provider or what is the name of the office in which  the patient attends annual eye exams? [facility]  If pt is not established with a provider, would they like to be referred to a provider to establish care? No .   Dental Screening: Recommended annual dental exams for proper oral hygiene  Community Resource Referral / Chronic Care Management: CRR required this visit?  No   CCM required this visit?  No      Plan:     I have personally reviewed and noted the following in the patient's chart:   Medical and social history Use of alcohol, tobacco or illicit drugs  Current medications and supplements including opioid prescriptions. Patient is not currently taking opioid prescriptions. Functional ability and status Nutritional status Physical activity Advanced directives List of other physicians Hospitalizations, surgeries, and ER visits in previous 12 months Vitals Screenings to  include cognitive, depression, and falls Referrals and appointments  In addition, I have reviewed and discussed with patient certain preventive protocols, quality metrics, and best practice recommendations. A written personalized care plan for preventive services as well as general preventive health recommendations were provided to patient.     Willette Brace, LPN   [id number]   Nurse Notes: Pt declined 6 CIT stating it makes her anxious and today she has a lot on  her mind pt did recite date time and year correctly and was knowledgeable of questions asked

## 2022-09-08 NOTE — Progress Notes (Signed)
Gynecologic Oncology Return Clinic Visit  09/08/22  Reason for Visit: Surveillance visit in the setting of history of cervical cancer   Treatment History: Oncology History  Malignant neoplasm of cervix ([place])  07/05/2021 Pathology Results   A. ENDOMETRIAL CURETTINGS:  Blood clots containing numerous strips of squamous epithelium with features of high-grade SIL(CIN-3/severe dysplasia).  Normal endocervical tissue or endometrial tissue are not identified.   B. CERVIX, CONE BIOPSY:  Invasive squamous cell carcinoma, focally keratinizing and moderately differentiated with circumferential involvement.  All margins including the deep margin are positive for squamous cell carcinoma.  Please see comment. [id number] positive   08/16/2021 Initial Diagnosis   Malignant neoplasm of cervix ([place])   08/30/2021 PET scan   Focal hypermetabolism in the region of the cervix, consistent with known primary cervical carcinoma.   No evidence of pelvic lymph node or distant metastatic disease.     09/01/2021 Cancer Staging   Staging form: Cervix Uteri, AJCC Version 9 - Clinical stage from 09/01/2021: FIGO Stage IB1 (cT1b1, cN0, cM0) - Signed by Heath Lark, MD on 09/01/2021 Stage prefix: Initial diagnosis   09/08/2021 Procedure   Successful placement of a right internal jugular approach power injectable Port-A-Cath. The catheter is ready for immediate use.     09/12/2021 - 11/22/2021 Radiation Therapy   IMRT pelvis: 09/12/21 through 10/14/21  Brachytherapy cevix: 10/25/21 through 11/22/21   Site Technique Total Dose (Gy) Dose per Fx (Gy) Completed Fx Beam Energies  Pelvis: Pelvis IMRT 45/45 1.8 25/25 6X  Cervix: Cervix_Bst_Fx1 HDR-brachy 5.5/5.5 5.5 1/1 Ir-192  Cervix: Cervix_Bst_Fx2 HDR-brachy 5.5/5.5 5.5 1/1 Ir-192  Cervix: Cervix_Bst_Fx3 HDR-brachy 5.5/5.5 5.5 1/1 Ir-192  Cervix: Cervix_Bst_Fx4 HDR-brachy 5.5/5.5 5.5 1/1 Ir-192  Cervix: Cervix_Bst_Fx5 HDR-brachy 5.5/5.5 5.5 1/1 Ir-192       09/13/2021  - 10/11/2021 Chemotherapy   Patient is on Treatment Plan : cervical Cisplatin q7d      02/23/2022 PET scan   Marked interval response to therapy with reduction of metabolic activity in the area of the cervix. Residual activity only minimally greater than mediastinal blood pool, attention on follow-up.   No signs of metastatic disease.   03/03/2022 Procedure   Removal of implanted Port-A-Cath utilizing sharp and blunt dissection. The procedure was uncomplicated.     Interval History: Bone density scan yesterday, showed osteoporosis.  Patient reports overall doing very well.  She denies any abdominal or pelvic pain.  Denies vaginal bleeding or discharge.  Is frequently having intercourse and/or using her vaginal dilator.  Reports some persistent urinary incontinence, interested in pelvic floor physical therapy now.  Denies any change to bowel function.  Past Medical/Surgical History: Past Medical History:  Diagnosis Date   Abnormal Pap smear of cervix    Arthritis    Cervical cancer (HCC)    Depression    GAD (generalized anxiety disorder)    GERD (gastroesophageal reflux disease)    History of migraine    History of radiation therapy    cervix - pelvis  09/12/2021-10/14/2021 Dr Gery Pray   History of radiation therapy    cervix - HDR brachy tandem and ring 10/25/2021-11/22/2021  Dr Gery Pray   History of transient ischemic attack (TIA) 01/2021   neruologist--- dr Leonie Man;  [facility]/ [facility] in [facility] ,  per note complicated migraine vs anxiety,  less likely TIA   HLD (hyperlipidemia)    Osteoporosis    Pain disorder    Scoliosis    Torn ACL (anterior cruciate ligament) left hand 11/05/2021   wearing  splint seeing ortho md week of 11-14-2021    Past Surgical History:  Procedure Laterality Date   CERVICAL CONIZATION W/BX N/A 08/04/2021   Procedure: CONIZATION CERVIX WITH BIOPSY: DILATION AND CURETTAGE OF UTERUS;  Surgeon: Sherlyn Hay, DO;  Location: [facility];  Service: Gynecology;  Laterality: N/A;   CHOLECYSTECTOMY OPEN  2005   approx   IR IMAGING GUIDED PORT INSERTION  09/08/2021   IR REMOVAL TUN ACCESS W/ PORT W/O FL MOD SED  03/03/2022   OPERATIVE ULTRASOUND N/A 10/25/2021   Procedure: OPERATIVE ULTRASOUND;  Surgeon: Gery Pray, MD;  Location: [facility];  Service: Urology;  Laterality: N/A;   OPERATIVE ULTRASOUND N/A 11/03/2021   Procedure: OPERATIVE ULTRASOUND;  Surgeon: Gery Pray, MD;  Location: [facility];  Service: Urology;  Laterality: N/A;   OPERATIVE ULTRASOUND N/A 11/10/2021   Procedure: OPERATIVE ULTRASOUND;  Surgeon: Gery Pray, MD;  Location: [facility];  Service: Urology;  Laterality: N/A;   OPERATIVE ULTRASOUND N/A 11/18/2021   Procedure: OPERATIVE ULTRASOUND;  Surgeon: Gery Pray, MD;  Location: [facility];  Service: Urology;  Laterality: N/A;   OPERATIVE ULTRASOUND N/A 11/22/2021   Procedure: OPERATIVE ULTRASOUND;  Surgeon: Gery Pray, MD;  Location: [facility];  Service: Urology;  Laterality: N/A;   TANDEM RING INSERTION N/A 10/25/2021   Procedure: TANDEM RING INSERTION;  Surgeon: Gery Pray, MD;  Location: [facility];  Service: Urology;  Laterality: N/A;   TANDEM RING INSERTION N/A 11/03/2021   Procedure: TANDEM RING INSERTION;  Surgeon: Gery Pray, MD;  Location: [facility];  Service: Urology;  Laterality: N/A;   TANDEM RING INSERTION N/A 11/10/2021   Procedure: TANDEM RING INSERTION;  Surgeon: Gery Pray, MD;  Location: [facility];  Service: Urology;  Laterality: N/A;   TANDEM RING INSERTION N/A 11/18/2021   Procedure: TANDEM RING INSERTION;  Surgeon: Gery Pray, MD;  Location: [facility];  Service: Urology;  Laterality: N/A;   TANDEM RING INSERTION N/A 11/22/2021   Procedure: TANDEM RING INSERTION;  Surgeon: Gery Pray, MD;  Location: [facility];  Service: Urology;  Laterality: N/A;   TENDON REPAIR Left 2008   Ankle/ foot    Family History  Problem Relation Age of Onset   Mental illness Mother    Stroke Mother    Dementia Mother    Alzheimer's disease Mother    Bladder Cancer Father    Healthy Brother    Healthy Brother    Healthy Brother    CAD Maternal Grandmother    CAD Maternal Grandfather    Healthy Son    Healthy Son    Colon cancer Neg Hx    Breast cancer Neg Hx    Ovarian cancer Neg Hx    Endometrial cancer Neg Hx    Pancreatic cancer Neg Hx    Prostate cancer Neg Hx     Social History   Socioeconomic History   Marital status: Married    Spouse name: Not on file   Number of children: 2   Years of education: Not on file   Highest education level: Not on file  Occupational History    Employer: [employer]    Comment: retired   Occupation: retired  Tobacco Use   Smoking status: Former    Packs/day: 1.00    Years: 15.00    Total pack years: 15.00    Types: Cigarettes  Quit date: 5    Years since quitting: 27.0   Smokeless tobacco: Never  Vaping Use   Vaping Use: Never used  Substance and Sexual Activity   Alcohol use: Yes    Alcohol/week: 3.0 standard drinks of alcohol    Types: 3 Standard drinks or equivalent per week    Comment: occ   Drug use: Never   Sexual activity: Yes    Birth control/protection: Post-menopausal  Other Topics Concern   Not on file  Social History Narrative   Work or School: Paediatric nurse [employer]      Home Situation: lives with husband      Spiritual Beliefs: Christian      Lifestyle: no regular exercise; diet good            Social Determinants of Radio broadcast assistant Strain: Not on file  Food Insecurity: Not on file  Transportation Needs: Not on file  Physical Activity: Not on file  Stress: Not on file  Social Connections: Unknown (09/08/2022)   Social Connection and Isolation Panel [NHANES]    Frequency of Communication  with Friends and Family: Not on file    Frequency of Social Gatherings with Friends and Family: Not on file    Attends Religious Services: 1 to 4 times per year    Active Member of [employer] or Organizations: Not on file    Attends Archivist Meetings: Not on file    Marital Status: Not on file    Current Medications:  Current Outpatient Medications:    alendronate (FOSAMAX) 70 MG tablet, Take 1 tablet (70 mg total) by mouth once a week. Take with a full glass of water on an empty stomach and sit upright for 30 minutes., Disp: 12 tablet, Rfl: 1   aspirin EC 81 MG EC tablet, Take 1 tablet (81 mg total) by mouth daily. Swallow whole., Disp: 30 tablet, Rfl: 11   atorvastatin (LIPITOR) 40 MG tablet, TAKE 1 TABLET BY MOUTH AT  BEDTIME, Disp: 90 tablet, Rfl: 3   Calcium Carbonate (CALCIUM-CARB 600 PO), Take 1 tablet by mouth daily., Disp: , Rfl:    cholecalciferol (VITAMIN D) 1000 UNITS tablet, Take 1,000 Units by mouth daily., Disp: , Rfl:    escitalopram (LEXAPRO) 10 MG tablet, TAKE 1 TABLET BY MOUTH DAILY, Disp: 90 tablet, Rfl: 3   pantoprazole (PROTONIX) 20 MG tablet, TAKE 1 TABLET BY MOUTH DAILY, Disp: 90 tablet, Rfl: 3  Review of Systems: Denies appetite changes, fevers, chills, fatigue, unexplained weight changes. Denies hearing loss, neck lumps or masses, mouth sores, ringing in ears or voice changes. Denies cough or wheezing.  Denies shortness of breath. Denies chest pain or palpitations. Denies leg swelling. Denies abdominal distention, pain, blood in stools, constipation, diarrhea, nausea, vomiting, or early satiety. Denies pain with intercourse, dysuria, frequency, hematuria or incontinence. Denies hot flashes, pelvic pain, vaginal bleeding or vaginal discharge.   Denies joint pain, back pain or muscle pain/cramps. Denies itching, rash, or wounds. Denies dizziness, headaches, numbness or seizures. Denies swollen lymph nodes or glands, denies easy bruising or  bleeding. Denies anxiety, depression, confusion, or decreased concentration.  Physical Exam: BP 109/77 (BP Location: Right Arm, Patient Position: Sitting)   Pulse 65   Temp 98.6 F (37 C) (Oral)   Resp 14   Wt 172 lb 0.2 oz (78 kg)   LMP 08/21/2002   SpO2 98%   BMI 27.35 kg/m  General: Alert, oriented, no acute distress. HEENT: Normocephalic, atraumatic, sclera  anicteric. Chest: Clear to auscultation bilaterally.  No wheezes or rhonchi. Cardiovascular: Regular rate and rhythm, no murmurs. Abdomen: soft, nontender.  Normoactive bowel sounds.  No masses or hepatosplenomegaly appreciated.   Extremities: Grossly normal range of motion.  Warm, well perfused.  No edema bilaterally. Skin: No rashes or lesions noted. Lymphatics: No cervical, supraclavicular, or inguinal adenopathy. GU: Normal appearing external genitalia without erythema, excoriation, or lesions.  Speculum exam reveals radiation changes at the vaginal apex and cervix, moderate atrophy also noted.  No visible lesions or masses.  No friable tissue noted.  Bimanual exam reveals cervix is flush with the vaginal apex long the posterior aspect, no firmness or nodularity.  Rectovaginal exam confirms these findings, no parametrial nodularity or thickening.  Laboratory & Radiologic Studies: None new  Assessment & Plan: Taylor Buchanan is a 67 y.o. woman with Stage IB2 SCC who presents for follow-up after completion of adjuvant chemoradiation in 11/2021.   Patient is doing well and is NED on exam today.  We discussed urinary symptoms again.  She is wanting to try pelvic floor physical therapy.  New referral placed.   We will continue with surveillance visits every 3 months, alternating between radiation oncology and our clinic.  Patient is scheduled to see Dr. Sondra Come in several months and will be due for a Pap test at that time.  I have asked her to call my office at the end of May or in June to schedule a visit for July.   Discussed  signs and symptoms that would be concerning for cancer recurrence, and I stressed the importance of calling if she develops any of these before her next visit.   We will plan to perform yearly cotesting.  22 minutes of total time was spent for this patient encounter, including preparation, face-to-face counseling with the patient and coordination of care, and documentation of the encounter.  Jeral Pinch, MD  Division of Gynecologic Oncology  Department of Obstetrics and Gynecology  [facility]

## 2022-09-08 NOTE — Patient Instructions (Addendum)
It was good to see you today.  I do not see or feel any evidence of cancer recurrence on your exam.  I will let you know when I get your PET scan results.  I will see you for follow-up in 6 months. Please call back sometime in June to schedule a visit to see me in mid-July.  As always, if you develop any new and concerning symptoms before your next visit, please call to see me sooner.

## 2022-09-21 ENCOUNTER — Other Ambulatory Visit: Payer: Self-pay

## 2022-09-21 ENCOUNTER — Ambulatory Visit: Payer: Medicare Other | Attending: Gynecologic Oncology

## 2022-09-21 DIAGNOSIS — R279 Unspecified lack of coordination: Secondary | ICD-10-CM | POA: Insufficient documentation

## 2022-09-21 DIAGNOSIS — M62838 Other muscle spasm: Secondary | ICD-10-CM | POA: Diagnosis present

## 2022-09-21 DIAGNOSIS — M6281 Muscle weakness (generalized): Secondary | ICD-10-CM

## 2022-09-21 DIAGNOSIS — R293 Abnormal posture: Secondary | ICD-10-CM | POA: Insufficient documentation

## 2022-09-21 NOTE — Therapy (Signed)
OUTPATIENT PHYSICAL THERAPY FEMALE PELVIC EVALUATION   Patient Name: Taylor Buchanan MRN: [id number] DOB:January 29, 1956, 67 y.o., female Today's Date: 09/21/2022  END OF SESSION:  PT End of Session - 09/21/22 0932     Visit Number 1    Date for PT Re-Evaluation 11/30/22    Authorization Type Medicare    PT Start Time 0901    PT Stop Time 0932    PT Time Calculation (min) 31 min    Activity Tolerance Patient tolerated treatment well    Behavior During Therapy [facility] for tasks assessed/performed             Past Medical History:  Diagnosis Date   Abnormal Pap smear of cervix    Arthritis    Cervical cancer ([place])    Depression    GAD (generalized anxiety disorder)    GERD (gastroesophageal reflux disease)    History of migraine    History of radiation therapy    cervix - pelvis  09/12/2021-10/14/2021 Dr Gery Pray   History of radiation therapy    cervix - HDR brachy tandem and ring 10/25/2021-11/22/2021  Dr Gery Pray   History of transient ischemic attack (TIA) 01/2021   neruologist--- dr Leonie Man;  ad[facility]/ 2022 in [facility] ,  per note complicated migraine vs anxiety,  less likely TIA   HLD (hyperlipidemia)    Osteoporosis    Pain disorder    Scoliosis    Torn ACL (anterior cruciate ligament) left hand 11/05/2021   wearing splint seeing ortho md week of 11-14-2021   Past Surgical History:  Procedure Laterality Date   CERVICAL CONIZATION W/BX N/A 08/04/2021   Procedure: CONIZATION CERVIX WITH BIOPSY: DILATION AND CURETTAGE OF UTERUS;  Surgeon: Sherlyn Hay, DO;  Location: [place];  Service: Gynecology;  Laterality: N/A;   CHOLECYSTECTOMY OPEN  2005   approx   IR IMAGING GUIDED PORT INSERTION  09/08/2021   IR REMOVAL TUN ACCESS W/ PORT W/O FL MOD SED  03/03/2022   OPERATIVE ULTRASOUND N/A 10/25/2021   Procedure: OPERATIVE ULTRASOUND;  Surgeon: Gery Pray, MD;  Location: [facility];  Service: Urology;  Laterality: N/A;   OPERATIVE  ULTRASOUND N/A 11/03/2021   Procedure: OPERATIVE ULTRASOUND;  Surgeon: Gery Pray, MD;  Location: [facility];  Service: Urology;  Laterality: N/A;   OPERATIVE ULTRASOUND N/A 11/10/2021   Procedure: OPERATIVE ULTRASOUND;  Surgeon: Gery Pray, MD;  Location: [facility];  Service: Urology;  Laterality: N/A;   OPERATIVE ULTRASOUND N/A 11/18/2021   Procedure: OPERATIVE ULTRASOUND;  Surgeon: Gery Pray, MD;  Location: [facility];  Service: Urology;  Laterality: N/A;   OPERATIVE ULTRASOUND N/A 11/22/2021   Procedure: OPERATIVE ULTRASOUND;  Surgeon: Gery Pray, MD;  Location: [facility];  Service: Urology;  Laterality: N/A;   TANDEM RING INSERTION N/A 10/25/2021   Procedure: TANDEM RING INSERTION;  Surgeon: Gery Pray, MD;  Location: [facility];  Service: Urology;  Laterality: N/A;   TANDEM RING INSERTION N/A 11/03/2021   Procedure: TANDEM RING INSERTION;  Surgeon: Gery Pray, MD;  Location: [facility];  Service: Urology;  Laterality: N/A;   TANDEM RING INSERTION N/A 11/10/2021   Procedure: TANDEM RING INSERTION;  Surgeon: Gery Pray, MD;  Location: [facility];  Service: Urology;  Laterality: N/A;   TANDEM RING INSERTION N/A 11/18/2021   Procedure: TANDEM RING INSERTION;  Surgeon: Gery Pray, MD;  Location: [facility];  Service:  Urology;  Laterality: N/A;   TANDEM RING INSERTION N/A 11/22/2021   Procedure: TANDEM RING INSERTION;  Surgeon: Gery Pray, MD;  Location: [facility];  Service: Urology;  Laterality: N/A;   TENDON REPAIR Left 2008   Ankle/ foot   Patient Active Problem List   Diagnosis Date Noted   Gastroesophageal reflux disease without esophagitis 02/08/2022   Vitamin B12 deficiency 02/08/2022   Deficiency anemia 11/03/2021   Leukopenia due to antineoplastic chemotherapy ([place]) 10/10/2021   Malignant neoplasm of cervix ([place])  08/16/2021   Panic disorder 05/31/2021   Mixed hyperlipidemia 02/14/2021   TIA (transient ischemic attack) 01/29/2021   Scoliosis 04/18/2019   Osteoporosis 09/02/2013    PCP: Leamon Arnt, MD  REFERRING PROVIDER: Lafonda Mosses, MD  REFERRING DIAG: N39.3 (ICD-10-CM) - Stress incontinence of urine  THERAPY DIAG:  Abnormal posture  Muscle weakness (generalized)  Other muscle spasm  Unspecified lack of coordination  Rationale for Evaluation and Treatment: Rehabilitation  ONSET DATE: 01/19/22  SUBJECTIVE:                                                                                                                                                                                           SUBJECTIVE STATEMENT: Pt states that she started having urinary incontinence after radiation. She would like to learn how to contract the pelvic floor. She loves exercise and physical therapy.  Fluid intake: Yes: drinking 32-48 oz a day; drinks about 3 cups of coffee in the morning and then sparkling water    PAIN:  Are you having pain? No   PRECAUTIONS: None  WEIGHT BEARING RESTRICTIONS: No  FALLS:  Has patient fallen in last 6 months? No  LIVING ENVIRONMENT: Lives with: lives with their spouse Lives in: House/apartment   OCCUPATION: retired Psychiatric nurse  PLOF: Independent  PATIENT GOALS: to learn how to contract pelvic floor better and decrease urinary incontinence   PERTINENT HISTORY:  Cervical cancer 08/16/21 with pelvic radiation/brachytherapy and chemotherapy, osteoporosis, G2P2 Sexual abuse: No  BOWEL MOVEMENT: Pain with bowel movement: No Type of bowel movement:Frequency 1x/day and Strain No Fully empty rectum: Yes: - Leakage: Yes: happened on occasion Pads: No Fiber supplement: No  URINATION: Pain with urination: No Fully empty bladder: Yes: - Stream: Strong Urgency: No - used to be Frequency: every 1-2 hours Leakage: Urge to void, Walking to  the bathroom, Coughing, Sneezing, and Laughing Pads: No  INTERCOURSE: Pain with intercourse:  no pain Ability to have vaginal penetration:  Yes: - Climax: yes Marinoff Scale: 0/3  PREGNANCY: Vaginal deliveries 2 Tearing Yes: small tears C-section deliveries 0 Currently pregnant  No  PROLAPSE: None   OBJECTIVE:   09/21/22: COGNITION: Overall cognitive status: Within functional limits for tasks assessed     SENSATION: Light touch: Appears intact Proprioception: Appears intact  MUSCLE LENGTH:   FUNCTIONAL TESTS:    GAIT: Comments: WNL  POSTURE:  scoliosis  PELVIC ALIGNMENT:  LUMBARAROM/PROM:  A/PROM A/PROM  eval  Flexion   Extension   Right lateral flexion   Left lateral flexion   Right rotation   Left rotation    (Blank rows = not tested)  LOWER EXTREMITY ROM:   LOWER EXTREMITY MMT:  MMT Right eval Left eval  Hip flexion    Hip extension    Hip abduction    Hip adduction    Hip internal rotation    Hip external rotation    Knee flexion    Knee extension    Ankle dorsiflexion    Ankle plantarflexion    Ankle inversion    Ankle eversion     PALPATION:   General  Some abdominal restriction/tightness                External Perineal Exam pale vulva, fusion of labia minora, dryness                             Internal Pelvic Floor dryness throughout, some constriction at introitus, deep pelvic floor muscle tension (Lt>Rt).   Patient confirms identification and approves PT to assess internal pelvic floor and treatment Yes  PELVIC MMT:   MMT eval  Vaginal 3/5 with single contraction, 3 seconds endurance, 9 repeat contractions with decreasing coordination/relaxation   Internal Anal Sphincter   External Anal Sphincter   Puborectalis   Diastasis Recti 2 finger width separation with minimal depth and distortion  (Blank rows = not tested)        TONE: Increased tone Rt pelvic floor with referral upon palpation into Rt lower abdomen and  hip  PROLAPSE: None seen this session, but pt had difficulty coordinating an appropriate bearing down  TODAY'S TREATMENT:                                                                                                                              DATE: 09/21/22  EVAL  Neuromuscular re-education: Pt provides verbal consent for internal vaginal/rectal pelvic floor exam. Pelvic floor contraction training with vaginal feedback and A/ROM 5x with breath coordination Therapeutic activities: Vulvovaginal massage    PATIENT EDUCATION:  Education details: see above Person educated: Patient Education method: Explanation, Demonstration, Tactile cues, Verbal cues, and Handouts Education comprehension: verbalized understanding  HOME EXERCISE PROGRAM: [id number]   ASSESSMENT:  CLINICAL IMPRESSION: Patient is a 67 y.o. female who was seen today for physical therapy evaluation and treatment for urinary incontinence. Exam findings notable for pelvic floor weakness, decreased pelvic floor endurance, increased pelvic floor tone in levator ani (Lt/Rt), poor coordination of pelvic floor contract/relax that is  worse with subsequent contractions, DRA 2 finger widths at umbilicus, increased abdominal restriction throughout, and abnormal posture. Signs and symptoms are most consistent with poor pelvic floor muscle coordination, strength, and tone that most likely occurred due to a combination of radiation therapy and normal age related changes. Initial treatment consisted of pelvic floor contraction training with emphasis on full relaxation; we discussed this being A/ROM vs strengthening and that she is not supposed to perform many contractions due to likelihood of that increasing abnormal pelvic floor tone. We also discussed beginning perineal massage with coconut oil to help improve vulvar circulation. She will benefit form skilled PT intervention in order to decrease urinary incontinence, improve frequency/urgency  of urination, and address all impairments.   OBJECTIVE IMPAIRMENTS: decreased activity tolerance, decreased coordination, decreased endurance, decreased strength, increased fascial restrictions, increased muscle spasms, impaired tone, postural dysfunction, and pain.   ACTIVITY LIMITATIONS: continence  PARTICIPATION LIMITATIONS: community activity  PERSONAL FACTORS: Fitness and 1 comorbidity: Cervical cancer 08/16/21 with pelvic radiation/brachytherapy and chemotherapy, osteoporosis, G2P2  are also affecting patient's functional outcome.   REHAB POTENTIAL: Good  CLINICAL DECISION MAKING: Stable/uncomplicated  EVALUATION COMPLEXITY: Low   GOALS: Goals reviewed with patient? Yes  SHORT TERM GOALS: Target date: 10/26/22  Pt will be independent with advanced HEP.   Baseline: Goal status: INITIAL  2.  Pt will be independent with the knack, urge suppression technique, and double voiding in order to improve bladder habits and decrease urinary incontinence.   Baseline:  Goal status: INITIAL   LONG TERM GOALS: Target date: 11/30/22  Pt will be independent with advanced HEP.   Baseline:  Goal status: INITIAL  2.  Pt will demonstrate normal pelvic floor muscle tone and A/ROM, able to achieve 4/5 strength with contractions and 10 sec endurance, in order to provide appropriate lumbopelvic support in functional activities.   Baseline:  Goal status: INITIAL  3.  Pt will report no episodes of urinary or fecal incontinence in order to improve confidence in community activities and personal hygiene.   Baseline:  Goal status: INITIAL  4.  Pt will report no leaks with laughing, coughing, sneezing in order to improve comfort with interpersonal relationships and community activities.   Baseline:  Goal status: INITIAL  5.  Pt will be able to go 2-3 hours in between voids without urgency or incontinence in order to improve QOL and perform all functional activities with less difficulty.    Baseline:  Goal status: INITIAL   PLAN:  PT FREQUENCY: 1-2x/week  PT DURATION: 10 weeks  PLANNED INTERVENTIONS: Therapeutic exercises, Therapeutic activity, Neuromuscular re-education, Balance training, Gait training, Patient/Family education, Self Care, Joint mobilization, Dry Needling, Biofeedback, and Manual therapy  PLAN FOR NEXT SESSION: Perform down training (child's pose, cat/cow, happy baby, open books, bent knee fall outs); core training; abdominal mobilization.   Heather Roberts, PT, DPT02/08/2408:02 AM

## 2022-09-21 NOTE — Patient Instructions (Signed)
Vulvar/vaginal Massage: This is a technique to help decrease painful sensitivity in the vaginal area. It can also help to restore normal moisture levels in the vaginal tissues. With coconut oil, aloe, jojoba oil, or a specific vaginal moisturizer, gently massage into vaginal tissues. Think of this as part of your post-shower routine and moisturizing just like you would the rest of the body with lotion. This helps to increase good blood flow to the vaginal tissues. In addition, it also teaches the body that touch to the vagina does not have to be painful or threatening, but moisturizing and gentle.   [facility] [address], [address] [place], [place] [address] Phone # [phone] Fax [phone]  

## 2022-09-22 ENCOUNTER — Encounter: Payer: Self-pay | Admitting: Family Medicine

## 2022-09-26 ENCOUNTER — Telehealth: Payer: Self-pay | Admitting: *Deleted

## 2022-09-26 NOTE — Telephone Encounter (Signed)
Returned patient's phone call, lvm for a return call 

## 2022-10-02 ENCOUNTER — Ambulatory Visit: Payer: Medicare Other

## 2022-10-02 DIAGNOSIS — M62838 Other muscle spasm: Secondary | ICD-10-CM

## 2022-10-02 DIAGNOSIS — R293 Abnormal posture: Secondary | ICD-10-CM

## 2022-10-02 DIAGNOSIS — M6281 Muscle weakness (generalized): Secondary | ICD-10-CM

## 2022-10-02 DIAGNOSIS — R279 Unspecified lack of coordination: Secondary | ICD-10-CM

## 2022-10-02 NOTE — Therapy (Addendum)
OUTPATIENT PHYSICAL THERAPY TREATMENT NOTE   Patient Name: Taylor Buchanan MRN: [id number] DOB:[birth year], 67 y.o., female Today's Date: 10/02/2022  PCP: Willow Ora, MD REFERRING PROVIDER: Carver Fila, MD  END OF SESSION:   PT End of Session - 10/02/22 0938     Visit Number 2    Date for PT Re-Evaluation 11/30/22    Authorization Type Medicare    PT Start Time 0937    PT Stop Time 1012    PT Time Calculation (min) 35 min    Activity Tolerance Patient tolerated treatment well    Behavior During Therapy [facility] for tasks assessed/performed             Past Medical History:  Diagnosis Date   Abnormal Pap smear of cervix    Arthritis    Cervical cancer (HCC)    Depression    GAD (generalized anxiety disorder)    GERD (gastroesophageal reflux disease)    History of migraine    History of radiation therapy    cervix - pelvis  09/12/2021-10/14/2021 Dr Antony Blackbird   History of radiation therapy    cervix - HDR brachy tandem and ring 10/25/2021-11/22/2021  Dr Antony Blackbird   History of transient ischemic attack (TIA) 01/2021   neruologist--- dr Pearlean Brownie;  [facility]/ 2022 in [facility] ,  per note complicated migraine vs anxiety,  less likely TIA   HLD (hyperlipidemia)    Osteoporosis    Pain disorder    Scoliosis    Torn ACL (anterior cruciate ligament) left hand 11/05/2021   wearing splint seeing ortho md week of 11-14-2021   Past Surgical History:  Procedure Laterality Date   CERVICAL CONIZATION W/BX N/A 08/04/2021   Procedure: CONIZATION CERVIX WITH BIOPSY: DILATION AND CURETTAGE OF UTERUS;  Surgeon: Edwinna Areola, DO;  Location: [facility];  Service: Gynecology;  Laterality: N/A;   CHOLECYSTECTOMY OPEN  2005   approx   IR IMAGING GUIDED PORT INSERTION  09/08/2021   IR REMOVAL TUN ACCESS W/ PORT W/O FL MOD SED  03/03/2022   OPERATIVE ULTRASOUND N/A 10/25/2021   Procedure: OPERATIVE ULTRASOUND;  Surgeon: Antony Blackbird, MD;  Location: [facility];  Service: Urology;  Laterality: N/A;   OPERATIVE ULTRASOUND N/A 11/03/2021   Procedure: OPERATIVE ULTRASOUND;  Surgeon: Antony Blackbird, MD;  Location: [facility];  Service: Urology;  Laterality: N/A;   OPERATIVE ULTRASOUND N/A 11/10/2021   Procedure: OPERATIVE ULTRASOUND;  Surgeon: Antony Blackbird, MD;  Location: [facility];  Service: Urology;  Laterality: N/A;   OPERATIVE ULTRASOUND N/A 11/18/2021   Procedure: OPERATIVE ULTRASOUND;  Surgeon: Antony Blackbird, MD;  Location: [facility];  Service: Urology;  Laterality: N/A;   OPERATIVE ULTRASOUND N/A 11/22/2021   Procedure: OPERATIVE ULTRASOUND;  Surgeon: Antony Blackbird, MD;  Location: [facility];  Service: Urology;  Laterality: N/A;   TANDEM RING INSERTION N/A 10/25/2021   Procedure: TANDEM RING INSERTION;  Surgeon: Antony Blackbird, MD;  Location: [facility];  Service: Urology;  Laterality: N/A;   TANDEM RING INSERTION N/A 11/03/2021   Procedure: TANDEM RING INSERTION;  Surgeon: Antony Blackbird, MD;  Location: [facility];  Service: Urology;  Laterality: N/A;   TANDEM RING INSERTION N/A 11/10/2021   Procedure: TANDEM RING INSERTION;  Surgeon: Antony Blackbird, MD;  Location: [facility];  Service: Urology;  Laterality: N/A;   TANDEM RING INSERTION N/A 11/18/2021   Procedure: TANDEM RING INSERTION;  Surgeon: Antony Blackbird, MD;  Location: [facility];  Service: Urology;  Laterality: N/A;   TANDEM RING INSERTION N/A 11/22/2021   Procedure: TANDEM RING INSERTION;  Surgeon: Antony Blackbird, MD;  Location: [facility];  Service: Urology;  Laterality: N/A;   TENDON REPAIR Left 2008   Ankle/ foot   Patient Active Problem List   Diagnosis Date Noted   Gastroesophageal reflux disease without esophagitis 02/08/2022   Vitamin B12 deficiency 02/08/2022   Deficiency anemia 11/03/2021   Leukopenia due to antineoplastic chemotherapy  (HCC) 10/10/2021   Malignant neoplasm of cervix (HCC) 08/16/2021   Panic disorder 05/31/2021   Mixed hyperlipidemia 02/14/2021   TIA (transient ischemic attack) 01/29/2021   Scoliosis 04/18/2019   Osteoporosis 09/02/2013    REFERRING DIAG: N39.3 (ICD-10-CM) - Stress incontinence of urine  THERAPY DIAG:  Abnormal posture  Muscle weakness (generalized)  Other muscle spasm  Unspecified lack of coordination  Rationale for Evaluation and Treatment Rehabilitation  PERTINENT HISTORY: Cervical cancer 08/16/21 with pelvic radiation/brachytherapy and chemotherapy, osteoporosis, G2P2  PRECAUTIONS: hx of cancer  SUBJECTIVE:                                                                                                                                                                                      SUBJECTIVE STATEMENT:  Pt states that she can feel pelvic floor contractions, but has not seen any change in condition so far.    PAIN:  Are you having pain? No  SUBJECTIVE STATEMENT: Pt states that she started having urinary incontinence after radiation. She would like to learn how to contract the pelvic floor. She loves exercise and physical therapy.  Fluid intake: Yes: drinking 32-48 oz a day; drinks about 3 cups of coffee in the morning and then sparkling water     PAIN:  Are you having pain? No     PRECAUTIONS: None   WEIGHT BEARING RESTRICTIONS: No   FALLS:  Has patient fallen in last 6 months? No   LIVING ENVIRONMENT: Lives with: lives with their spouse Lives in: House/apartment     OCCUPATION: retired Banker   PLOF: Independent   PATIENT GOALS: to learn how to contract pelvic floor better and decrease urinary incontinence    PERTINENT HISTORY:  Cervical cancer 08/16/21 with pelvic radiation/brachytherapy and chemotherapy, osteoporosis, G2P2 Sexual abuse: No   BOWEL MOVEMENT: Pain with bowel movement: No Type of bowel movement:Frequency 1x/day  and Strain No Fully empty rectum: Yes: - Leakage: Yes: happened on occasion Pads: No Fiber supplement: No   URINATION: Pain with urination: No Fully empty bladder: Yes: - Stream: Strong Urgency: No - used to  be Frequency: every 1-2 hours Leakage: Urge to void, Walking to the bathroom, Coughing, Sneezing, and Laughing Pads: No   INTERCOURSE: Pain with intercourse:  no pain Ability to have vaginal penetration:  Yes: - Climax: yes Marinoff Scale: 0/3   PREGNANCY: Vaginal deliveries 2 Tearing Yes: small tears C-section deliveries 0 Currently pregnant No   PROLAPSE: None     OBJECTIVE:    09/21/22: COGNITION: Overall cognitive status: Within functional limits for tasks assessed                          SENSATION: Light touch: Appears intact Proprioception: Appears intact   MUSCLE LENGTH:     FUNCTIONAL TESTS:      GAIT: Comments: WNL   POSTURE:  scoliosis   PELVIC ALIGNMENT:   LUMBARAROM/PROM:   A/PROM A/PROM  eval  Flexion    Extension    Right lateral flexion    Left lateral flexion    Right rotation    Left rotation     (Blank rows = not tested)   LOWER EXTREMITY ROM:     LOWER EXTREMITY MMT:   MMT Right eval Left eval  Hip flexion      Hip extension      Hip abduction      Hip adduction      Hip internal rotation      Hip external rotation      Knee flexion      Knee extension      Ankle dorsiflexion      Ankle plantarflexion      Ankle inversion      Ankle eversion        PALPATION:   General  Some abdominal restriction/tightness                 External Perineal Exam pale vulva, fusion of labia minora, dryness                             Internal Pelvic Floor dryness throughout, some constriction at introitus, deep pelvic floor muscle tension (Lt>Rt).    Patient confirms identification and approves PT to assess internal pelvic floor and treatment Yes   PELVIC MMT:   MMT eval  Vaginal 3/5 with single contraction, 3  seconds endurance, 9 repeat contractions with decreasing coordination/relaxation   Internal Anal Sphincter    External Anal Sphincter    Puborectalis    Diastasis Recti 2 finger width separation with minimal depth and distortion  (Blank rows = not tested)         TONE: Increased tone Rt pelvic floor with referral upon palpation into Rt lower abdomen and hip   PROLAPSE: None seen this session, but pt had difficulty coordinating an appropriate bearing down   TODAY'S TREATMENT:  Manual: Abdominal soft tissue mobilization Diaphragm release Bladder mobilization Neuromuscular re-education: Diaphragmatic breathing Happy baby 10 breaths Child's pose 10 breaths Exercises: Cat cow 2 x 10 Supine piriformis stretch 60 sec bil  DATE: 09/21/22  EVAL  Neuromuscular re-education: Pt provides verbal consent for internal vaginal/rectal pelvic floor exam. Pelvic floor contraction training with vaginal feedback and A/ROM 5x with breath coordination Therapeutic activities: Vulvovaginal massage       PATIENT EDUCATION:  Education details: see above Person educated: Patient Education method: Explanation, Demonstration, Tactile cues, Verbal cues, and Handouts Education comprehension: verbalized understanding   HOME EXERCISE PROGRAM: [id number]     ASSESSMENT:   CLINICAL IMPRESSION: Abdominal manual techniques performed today to help improve circulation to pelvic, increase lumbopelvic mobility, and desensitize area; as we were working through restriction, pt states that she has had some amount of fear surrounding abdomen/pelvis and does not like to touch due to fear of finding something. We discussed that this fear is very important to recovery, and that performing self massage will help her reconnect to this part of her body in a health way that will also allow for  improved proprioception over pelvic floor. She had good diaphragm release with intervention. Good tolerance to down training activities and stretches with no pain and good breath coordination. She will continue to benefit form skilled PT intervention in order to decrease urinary incontinence, improve frequency/urgency of urination, and address all impairments.    OBJECTIVE IMPAIRMENTS: decreased activity tolerance, decreased coordination, decreased endurance, decreased strength, increased fascial restrictions, increased muscle spasms, impaired tone, postural dysfunction, and pain.    ACTIVITY LIMITATIONS: continence   PARTICIPATION LIMITATIONS: community activity   PERSONAL FACTORS: Fitness and 1 comorbidity: Cervical cancer 08/16/21 with pelvic radiation/brachytherapy and chemotherapy, osteoporosis, G2P2  are also affecting patient's functional outcome.    REHAB POTENTIAL: Good   CLINICAL DECISION MAKING: Stable/uncomplicated   EVALUATION COMPLEXITY: Low     GOALS: Goals reviewed with patient? Yes   SHORT TERM GOALS: Target date: 10/26/22   Pt will be independent with advanced HEP.    Baseline: Goal status: INITIAL   2.  Pt will be independent with the knack, urge suppression technique, and double voiding in order to improve bladder habits and decrease urinary incontinence.    Baseline:  Goal status: INITIAL     LONG TERM GOALS: Target date: 11/30/22   Pt will be independent with advanced HEP.    Baseline:  Goal status: INITIAL   2.  Pt will demonstrate normal pelvic floor muscle tone and A/ROM, able to achieve 4/5 strength with contractions and 10 sec endurance, in order to provide appropriate lumbopelvic support in functional activities.    Baseline:  Goal status: INITIAL   3.  Pt will report no episodes of urinary or fecal incontinence in order to improve confidence in community activities and personal hygiene.    Baseline:  Goal status: INITIAL   4.  Pt will report  no leaks with laughing, coughing, sneezing in order to improve comfort with interpersonal relationships and community activities.    Baseline:  Goal status: INITIAL   5.  Pt will be able to go 2-3 hours in between voids without urgency or incontinence in order to improve QOL and perform all functional activities with less difficulty.    Baseline:  Goal status: INITIAL     PLAN:   PT FREQUENCY: 1-2x/week   PT DURATION: 10 weeks   PLANNED INTERVENTIONS: Therapeutic exercises, Therapeutic activity, Neuromuscular re-education, Balance training, Gait training, Patient/Family education, Self Care, Joint mobilization, Dry Needling, Biofeedback, and Manual therapy   PLAN FOR NEXT SESSION: Perform down training (child's pose, cat/cow, happy baby, open books, bent knee fall outs); core training;  abdominal mobilization.   Julio Alm, PT, DPT02/07/2409:31 AM  PHYSICAL THERAPY DISCHARGE SUMMARY  Visits from Start of Care: 2  Current functional level related to goals / functional outcomes: Unknown   Remaining deficits: See above   Education / Equipment: HEP   Patient agrees to discharge. Patient goals were not met. Patient is being discharged due to not returning since the last visit.  Julio Alm, PT, DPT02/03/253:30 PM

## 2022-10-09 ENCOUNTER — Encounter: Payer: Self-pay | Admitting: Gynecologic Oncology

## 2022-10-09 ENCOUNTER — Encounter (HOSPITAL_COMMUNITY)
Admission: RE | Admit: 2022-10-09 | Discharge: 2022-10-09 | Disposition: A | Discharge status: Home / Self Care | Payer: Medicare Other | Source: Ambulatory Visit | Attending: Gynecologic Oncology | Admitting: Gynecologic Oncology

## 2022-10-09 DIAGNOSIS — C539 Malignant neoplasm of cervix uteri, unspecified: Secondary | ICD-10-CM | POA: Diagnosis not present

## 2022-10-09 DIAGNOSIS — I700 Atherosclerosis of aorta: Secondary | ICD-10-CM | POA: Insufficient documentation

## 2022-10-09 DIAGNOSIS — N200 Calculus of kidney: Secondary | ICD-10-CM | POA: Insufficient documentation

## 2022-10-09 LAB — GLUCOSE, CAPILLARY: Glucose-Capillary: 108 mg/dL — ABNORMAL HIGH (ref 70–99)

## 2022-10-09 MED ORDER — FLUDEOXYGLUCOSE F - 18 (FDG) INJECTION
8.6000 | Freq: Once | INTRAVENOUS | Status: AC
  Administered 2022-10-09: 8.49 via INTRAVENOUS

## 2022-10-09 NOTE — Telephone Encounter (Signed)
Left 2nd message for patient to call back to pick up orthotics.

## 2022-10-10 DIAGNOSIS — C539 Malignant neoplasm of cervix uteri, unspecified: Secondary | ICD-10-CM

## 2022-10-10 NOTE — Research (Signed)
MTG-015 - Tissue and Bodily Fluids: Translational Medicine: Discovery and Evaluation of Biomarkers/Pharmacogenomics for the Diagnosis and Personalized Management of Patients   One year follow up call done with patient. She will be in clinic to see Dr Sondra Come on 10/30/22; she agreed that we can bring her gift card to her then.  Marjie Skiff Lucile Hillmann, RN, BSN, [facility] She  Her  Hers Clinical Research Nurse [facility] Direct Dial [phone]  Pager [phone] 10/10/2022 1:22 PM

## 2022-10-12 LAB — HM MAMMOGRAPHY

## 2022-10-19 ENCOUNTER — Ambulatory Visit: Payer: Self-pay

## 2022-10-23 NOTE — Progress Notes (Signed)
Taylor Buchanan is here today for follow up post radiation to the pelvic.  They completed their radiation on: 11/22/2021   Does the patient complain of any of the following:  Pain: No Abdominal bloating: No Diarrhea/Constipation: No Nausea/Vomiting: No Vaginal Discharge: No Blood in Urine or Stool: No Urinary Issues (dysuria/incomplete emptying/ incontinence/ increased frequency/urgency): No Does patient report using vaginal dilator 2-3 times a week and/or sexually active 2-3 weeks: Patient is sexually active.  Post radiation skin changes: No   Additional comments if applicable:  BP [id number] (BP Location: Left Arm, Patient Position: Sitting)   Pulse 81   Temp (!) 96.7 F (35.9 C) (Temporal)   Resp 18   Ht 5' 6.5" (1.689 m)   Wt 172 lb 8 oz (78.2 kg)   LMP 08/21/2002   SpO2 97%   BMI 27.43 kg/m

## 2022-10-28 NOTE — Progress Notes (Signed)
Radiation Oncology         ([phone]) [phone] ________________________________  Name: Taylor Buchanan MRN: [id number]  Date: 10/30/2022  DOB: [birth year]  Follow-Up Visit Note  CC: Leamon Arnt, MD  Lafonda Mosses, MD  No diagnosis found.  Diagnosis: The encounter diagnosis was Malignant neoplasm of cervix, unspecified site [facility]).   At least stage IB1 SCC grade 2 of the cervix (p16 positive)  Interval Since Last Radiation: 11 months and 1 week  Intent: Curative  Radiation Treatment Dates: 09/12/2021 through 11/22/2021   IMRT pelvis: 09/12/21 through 10/14/21   Brachytherapy cevix: 10/25/21 through 11/22/21   Site Technique Total Dose (Gy) Dose per Fx (Gy) Completed Fx Beam Energies  Pelvis: Pelvis IMRT 45/45 1.8 25/25 6X  Cervix: Cervix_Bst_Fx1 HDR-brachy 5.5/5.5 5.5 1/1 Ir-192  Cervix: Cervix_Bst_Fx2 HDR-brachy 5.5/5.5 5.5 1/1 Ir-192  Cervix: Cervix_Bst_Fx3 HDR-brachy 5.5/5.5 5.5 1/1 Ir-192  Cervix: Cervix_Bst_Fx4 HDR-brachy 5.5/5.5 5.5 1/1 Ir-192  Cervix: Cervix_Bst_Fx5 HDR-brachy 5.5/5.5 5.5 1/1 Ir-192     Narrative:  The patient returns today for routine 6 month follow-up. She was last seen here for follow-up 05/25/22. Since her last visit, the patient followed up with Dr. Berline Lopes on 09/08/22. During which time, the patient endorsed some persistent urinary incontinence. She otherwise denied any symptoms and was noted as NED on exam. She also reported having frequent intercourse and/or using her vaginal dilator regularly. For her incontinence, the patient requested a referral for pelvic floor therapy which Dr. Berline Lopes has placed.          Pertinent imaging performed in the interval includes:  -- DXA for bone mineral density on 09/07/22 which showed a radial T-score of -3.4, classifying the patient as osteoporotic.     -- Restaging PET scan on 10/09/22 demonstrated stable low-level homogeneous cervical FDG avidity without focality without convincing evidence of residual/recurrent  hypermetabolic disease in the cervix. PET also showed no evidence of metastatic disease elsewhere.   ***                    Allergies:  has No Known Allergies.  Meds: Current Outpatient Medications  Medication Sig Dispense Refill   alendronate (FOSAMAX) 70 MG tablet Take 1 tablet (70 mg total) by mouth once a week. Take with a full glass of water on an empty stomach and sit upright for 30 minutes. 12 tablet 1   aspirin EC 81 MG EC tablet Take 1 tablet (81 mg total) by mouth daily. Swallow whole. 30 tablet 11   atorvastatin (LIPITOR) 40 MG tablet TAKE 1 TABLET BY MOUTH AT  BEDTIME 90 tablet 3   Calcium Carbonate (CALCIUM-CARB 600 PO) Take 1 tablet by mouth daily.     cholecalciferol (VITAMIN D) 1000 UNITS tablet Take 1,000 Units by mouth daily.     escitalopram (LEXAPRO) 10 MG tablet TAKE 1 TABLET BY MOUTH DAILY 90 tablet 3   pantoprazole (PROTONIX) 20 MG tablet TAKE 1 TABLET BY MOUTH DAILY 90 tablet 3   No current facility-administered medications for this encounter.    Physical Findings: The patient is in no acute distress. Patient is alert and oriented.  vitals were not taken for this visit. .  No significant changes. Lungs are clear to auscultation bilaterally. Heart has regular rate and rhythm. No palpable cervical, supraclavicular, or axillary adenopathy. Abdomen soft, non-tender, normal bowel sounds.  On pelvic examination the external genitalia were unremarkable. A speculum exam was performed. There are no mucosal lesions noted in the vaginal vault. A  Pap smear was obtained of the proximal vagina. On bimanual and rectovaginal examination there were no pelvic masses appreciated. ***    Lab Findings: Lab Results  Component Value Date   WBC 4.2 02/22/2022   HGB 14.1 02/22/2022   HCT 40.1 02/22/2022   MCV 97.3 02/22/2022   PLT 249 02/22/2022    Radiographic Findings: NM PET Image Restage (PS) Skull Base to Thigh (F-18 FDG)  Result Date: 10/09/2022 CLINICAL DATA:   Subsequent treatment strategy for cervical cancer. EXAM: NUCLEAR MEDICINE PET SKULL BASE TO THIGH TECHNIQUE: 8.5 mCi F-18 FDG was injected intravenously. Full-ring PET imaging was performed from the skull base to thigh after the radiotracer. CT data was obtained and used for attenuation correction and anatomic localization. Fasting blood glucose: 108 mg/dl COMPARISON:  Multiple priors including most recent PET-CT February 22, 2022 FINDINGS: Mediastinal blood pool activity: SUV max 2.2 Liver activity: SUV max NA NECK: No hypermetabolic cervical adenopathy. Symmetric hypermetabolic hyperplasia of the tonsils commonly reactive. Incidental CT findings: None. CHEST: No hypermetabolic thoracic adenopathy. No hypermetabolic pulmonary nodules or masses. Incidental CT findings: Gas fluid levels in a patulous esophagus. Aortic atherosclerosis. Hypoventilatory change in the dependent lungs. Motion degraded examination reveals no suspicious pulmonary nodules or masses. ABDOMEN/PELVIS: Low-level homogeneous cervical FDG avidity without focality with a max SUV of 3.2 previously 3.3. No abnormal hypermetabolic activity within the liver, pancreas, adrenal glands, or spleen. No hypermetabolic lymph nodes in the abdomen or pelvis. Incidental CT findings: No hydronephrosis. Nonobstructive renal stones. Gallbladder surgically absent with stable prominence of the common bile duct. SKELETON: No focal hypermetabolic activity to suggest skeletal metastasis. Incidental CT findings: Rotary levoconvex scoliotic curvature of the thoracolumbar spine with associated degenerative change similar prior. IMPRESSION: 1. Stable low-level homogeneous cervical FDG avidity without focality, no convincing evidence of residual/recurrent hypermetabolic disease in the cervix. 2. No evidence of hypermetabolic metastatic disease. 3. Gas fluid levels in a patulous esophagus suggests gastroesophageal reflux. 4. Nonobstructive renal stones. 5.  Aortic Atherosclerosis  (ICD10-I70.0). Electronically Signed   By: Dahlia Bailiff M.D.   On: 10/09/2022 14:57    Impression: The encounter diagnosis was Malignant neoplasm of cervix, unspecified site [facility]).   At least stage IB1 SCC grade 2 of the cervix (p16 positive)   The patient is recovering from the effects of radiation.  ***  Plan:  ***   *** minutes of total time was spent for this patient encounter, including preparation, face-to-face counseling with the patient and coordination of care, physical exam, and documentation of the encounter. ____________________________________  Blair Promise, PhD, MD  This document serves as a record of services personally performed by Gery Pray, MD. It was created on his behalf by Roney Mans, a trained medical scribe. The creation of this record is based on the scribe's personal observations and the provider's statements to them. This document has been checked and approved by the attending provider.

## 2022-10-30 ENCOUNTER — Encounter: Payer: Self-pay | Admitting: Radiation Oncology

## 2022-10-30 ENCOUNTER — Ambulatory Visit
Admission: RE | Admit: 2022-10-30 | Discharge: 2022-10-30 | Disposition: A | Discharge status: Home / Self Care | Payer: Medicare Other | Source: Ambulatory Visit | Attending: Radiation Oncology | Admitting: Radiation Oncology

## 2022-10-30 ENCOUNTER — Other Ambulatory Visit (HOSPITAL_COMMUNITY)
Admission: RE | Admit: 2022-10-30 | Discharge: 2022-10-30 | Disposition: A | Discharge status: Home / Self Care | Payer: Medicare Other | Source: Ambulatory Visit | Attending: Radiation Oncology | Admitting: Radiation Oncology

## 2022-10-30 DIAGNOSIS — Z8541 Personal history of malignant neoplasm of cervix uteri: Secondary | ICD-10-CM | POA: Insufficient documentation

## 2022-10-30 DIAGNOSIS — R32 Unspecified urinary incontinence: Secondary | ICD-10-CM | POA: Insufficient documentation

## 2022-10-30 DIAGNOSIS — Z01419 Encounter for gynecological examination (general) (routine) without abnormal findings: Secondary | ICD-10-CM | POA: Insufficient documentation

## 2022-10-30 DIAGNOSIS — Z7982 Long term (current) use of aspirin: Secondary | ICD-10-CM | POA: Insufficient documentation

## 2022-10-30 DIAGNOSIS — Z1151 Encounter for screening for human papillomavirus (HPV): Secondary | ICD-10-CM | POA: Insufficient documentation

## 2022-10-30 DIAGNOSIS — C539 Malignant neoplasm of cervix uteri, unspecified: Secondary | ICD-10-CM | POA: Insufficient documentation

## 2022-10-30 DIAGNOSIS — I700 Atherosclerosis of aorta: Secondary | ICD-10-CM | POA: Insufficient documentation

## 2022-10-30 DIAGNOSIS — Z9049 Acquired absence of other specified parts of digestive tract: Secondary | ICD-10-CM | POA: Diagnosis not present

## 2022-10-30 DIAGNOSIS — Z79899 Other long term (current) drug therapy: Secondary | ICD-10-CM | POA: Insufficient documentation

## 2022-10-30 DIAGNOSIS — N200 Calculus of kidney: Secondary | ICD-10-CM | POA: Insufficient documentation

## 2022-10-30 DIAGNOSIS — Z923 Personal history of irradiation: Secondary | ICD-10-CM | POA: Diagnosis not present

## 2022-10-30 DIAGNOSIS — J351 Hypertrophy of tonsils: Secondary | ICD-10-CM | POA: Diagnosis not present

## 2022-10-30 NOTE — Research (Signed)
MTG-015 - Tissue and Bodily Fluids: Translational Medicine: Discovery and Evaluation of Biomarkers/Pharmacogenomics for the Diagnosis and Personalized Management of Patients    Met with patient in radiology waiting room. Provided gift card, for which she signed (for her one year MTG follow up). Thanked her for her participation in research.   Marjie Skiff Blessed Cotham, RN, BSN, [facility] She  Her  Hers Clinical Research Nurse [place] [phone]  Pager [phone] 10/30/2022 10:48 AM

## 2022-10-30 NOTE — Research (Unsigned)
MTG-015 - Tissue and Bodily Fluids: Translational Medicine: Discovery and Evaluation of Biomarkers/Pharmacogenomics for the Diagnosis and Personalized Management of Patients    Met with patient in radiology waiting room. Provided gift card, for which she signed (for her one year MTG follow up). Thanked her for her participation in research.   Marjie Skiff Brelee Renk, RN, BSN, [facility] She  Her  Hers Clinical Research Nurse [place] [phone]  Pager [phone] 10/30/2022 10:48 AM

## 2022-10-30 NOTE — Research (Deleted)
MTG-015 - Tissue and Bodily Fluids: Translational Medicine: Discovery and Evaluation of Biomarkers/Pharmacogenomics for the Diagnosis and Personalized Management of Patients   Met with patient in radiology waiting room. Provided gift card, for which she signed (for her one year MTG follow up). Thanked her for her participation in research.  Marjie Skiff Zyairah Wacha, RN, BSN, [facility] She  Her  Hers Clinical Research Nurse [place] [phone]  Pager [phone] 10/30/2022 10:48 AM

## 2022-11-01 ENCOUNTER — Ambulatory Visit: Payer: Medicare Other

## 2022-11-01 NOTE — Therapy (Signed)
Pt. Canceled "something came up".

## 2022-11-07 LAB — CYTOLOGY - PAP
Comment: NEGATIVE
Diagnosis: NEGATIVE
High risk HPV: NEGATIVE

## 2022-11-08 ENCOUNTER — Telehealth: Payer: Self-pay

## 2022-11-08 NOTE — Telephone Encounter (Signed)
Call placed to patient to make aware of negative pap results. Patient verbalized understanding.

## 2022-11-23 ENCOUNTER — Ambulatory Visit: Payer: Self-pay | Admitting: Radiation Oncology

## 2022-12-05 ENCOUNTER — Telehealth: Payer: Self-pay | Admitting: *Deleted

## 2022-12-05 NOTE — Telephone Encounter (Signed)
CALLED PATIENT TO INFORM OF FU APPT. WITH DR. Pricilla Holm ON 03/09/23 @ 1:15 PM, SPOKE WITH PATIENT AND SHE IS AWARE OF THIS APPT.

## 2023-01-23 ENCOUNTER — Other Ambulatory Visit: Payer: Self-pay | Admitting: Family Medicine

## 2023-02-02 ENCOUNTER — Telehealth: Payer: Self-pay | Admitting: *Deleted

## 2023-02-02 ENCOUNTER — Encounter: Payer: Self-pay | Admitting: Gynecologic Oncology

## 2023-02-02 NOTE — Telephone Encounter (Addendum)
Spoke with Taylor Buchanan in regards to her message left in clinical pool.  Patient states she is having a watery pale yellow discharge that started a few weeks ago that has no odor. Patient states a one time watery pink discharge after sexual intercourse with a hint of discomfort yesterday that has not returned. Patient denies all urinary symptoms. Denies fever and chills.  Patient denies back pain and pelvic pain.Pt denies nothing per vaginal accept having sexual intercourse at least 2-3 times weekly. Denies any new medications, cream, ointments or new soap.   Patient endorses an increase in activity including lots of hiking on her 3 month RV/Camping trip with her husband. Patient states she has been feeling well and they are due to return home on Saturday. Patient's message was relayed to Dr. Pricilla Holm and Warner Mccreedy, NP.  Patient has a scheduled follow up appointment with Dr. Pricilla Holm on [id number]. At 1:15 pm. Patient thanked the office for calling.  Pt advised to call the office with any increased symptoms, changes, questions or concerns.

## 2023-02-12 ENCOUNTER — Other Ambulatory Visit (HOSPITAL_COMMUNITY)
Admission: RE | Admit: 2023-02-12 | Discharge: 2023-02-12 | Disposition: A | Discharge status: Home / Self Care | Payer: Medicare Other | Source: Ambulatory Visit | Attending: Family Medicine | Admitting: Family Medicine

## 2023-02-12 ENCOUNTER — Encounter: Payer: Self-pay | Admitting: Family Medicine

## 2023-02-12 ENCOUNTER — Ambulatory Visit (INDEPENDENT_AMBULATORY_CARE_PROVIDER_SITE_OTHER): Payer: Medicare Other | Admitting: Family Medicine

## 2023-02-12 VITALS — BP 116/70 | HR 82 | Temp 98.7°F | Ht 66.0 in | Wt <= 1120 oz

## 2023-02-12 DIAGNOSIS — N898 Other specified noninflammatory disorders of vagina: Secondary | ICD-10-CM | POA: Insufficient documentation

## 2023-02-12 DIAGNOSIS — C539 Malignant neoplasm of cervix uteri, unspecified: Secondary | ICD-10-CM | POA: Diagnosis not present

## 2023-02-12 DIAGNOSIS — E538 Deficiency of other specified B group vitamins: Secondary | ICD-10-CM

## 2023-02-12 DIAGNOSIS — E782 Mixed hyperlipidemia: Secondary | ICD-10-CM | POA: Diagnosis not present

## 2023-02-12 DIAGNOSIS — F410 Panic disorder [episodic paroxysmal anxiety] without agoraphobia: Secondary | ICD-10-CM | POA: Diagnosis not present

## 2023-02-12 DIAGNOSIS — K219 Gastro-esophageal reflux disease without esophagitis: Secondary | ICD-10-CM

## 2023-02-12 DIAGNOSIS — E559 Vitamin D deficiency, unspecified: Secondary | ICD-10-CM

## 2023-02-12 DIAGNOSIS — M810 Age-related osteoporosis without current pathological fracture: Secondary | ICD-10-CM

## 2023-02-12 NOTE — Progress Notes (Signed)
Subjective  Chief Complaint  Patient presents with   Annual Exam    Pt here for Annual exam and is currently     HPI: Taylor Buchanan is a 67 y.o. female who presents to [facility] [place] today for a Female Wellness Visit. She also has the concerns and/or needs as listed above in the chief complaint. These will be addressed in addition to the Health Maintenance Visit.   Wellness Visit: annual visit with health maintenance review and exam without Pap  HM: f/u cervical cancer: stable. Paps ok. Mammo current. To call to schedule her colonoscopy which is due soon. Just returned from 3 months cross country trip; had a fabulous time Chronic disease f/u and/or acute problem visit: (deemed necessary to be done in addition to the wellness visit): Vaginal discharge: thin, yellowish. No odor or pain. No std risk. Started a few months ago. S/p treatment for cervical cancer.  HLD on statin due for recheck.  Doing great; ready to stop lexapro. Used x 2 years with good response; started due to anxiety related to health concerns.  On PPI and stable.   Assessment  1. Malignant neoplasm of cervix, unspecified site (HCC)   2. Mixed hyperlipidemia   3. Osteoporosis without current pathological fracture, unspecified osteoporosis type   4. Panic disorder   5. Vitamin B12 deficiency   6. Gastroesophageal reflux disease without esophagitis   7. Vitamin D deficiency      Plan  Female Wellness Visit: Age appropriate Health Maintenance and Prevention measures were discussed with patient. Included topics are cancer screening recommendations, ways to keep healthy (see AVS) including dietary and exercise recommendations, regular eye and dental care, use of seat belts, and avoidance of moderate alcohol use and tobacco use. CRC screen due BMI: discussed patient's BMI and encouraged positive lifestyle modifications to help get to or maintain a target BMI. HM needs and immunizations were  addressed and ordered. See below for orders. See HM and immunization section for updates. Routine labs and screening tests ordered including cmp, cbc and lipids where appropriate. Discussed recommendations regarding Vit D and calcium supplementation (see AVS)  Chronic disease management visit and/or acute problem visit: Check for vaginal infection.  Check lipids and lfts Monitor b12 on oral supplements. Discussed weaning lexapro.  Genella Rife is controlled.  Follow up: 12 mo for cpe Orders Placed This Encounter  Procedures   Vitamin B12   VITAMIN D 25 Hydroxy (Vit-D Deficiency, Fractures)   CBC with Differential/Platelet   Comprehensive metabolic panel   Lipid panel   TSH   No orders of the defined types were placed in this encounter.     Body mass index is 0.03 kg/m. Wt Readings from Last 3 Encounters:  02/12/23 (!) 3.2 oz (0.091 kg)  10/30/22 172 lb 8 oz (78.2 kg)  09/08/22 172 lb 0.2 oz (78 kg)     Patient Active Problem List   Diagnosis Date Noted   Malignant neoplasm of cervix (HCC) 08/16/2021    Priority: High   Panic disorder 05/31/2021    Priority: High   Mixed hyperlipidemia 02/14/2021    Priority: High   TIA (transient ischemic attack) 01/29/2021    Priority: High    Vs migraine equivalent; evaluated by neuro    Osteoporosis 09/02/2013    Priority: High    Dexa 06/2019: T - -3.3 radius, -2.3 hip. Failed biphosphonates in past. rec prolia. Restarted fosamax 05/2021 due to cost of prolia. Dexa 09/2022: lowest  T = -3.4 radius, -2.4 hip on fosamax. Continue.     Gastroesophageal reflux disease without esophagitis 02/08/2022    Priority: Low   Vitamin B12 deficiency 02/08/2022    Priority: Low   Deficiency anemia 11/03/2021    Priority: Low   Scoliosis 04/18/2019    Priority: Low   Leukopenia due to antineoplastic chemotherapy (HCC) 10/10/2021   Health Maintenance  Topic Date Due   Colonoscopy  03/22/2023   COVID-19 Vaccine (4 - 2023-24 season) 02/28/2023  (Originally 04/21/2022)   Pneumonia Vaccine 84+ Years old (1 of 1 - PCV) 02/12/2024 (Originally 09/04/2021)   INFLUENZA VACCINE  03/22/2023   DTaP/Tdap/Td (2 - Td or Tdap) 09/03/2023   Medicare Annual Wellness (AWV)  09/09/2023   MAMMOGRAM  10/13/2023   DEXA SCAN  09/07/2025   Hepatitis C Screening  Completed   Zoster Vaccines- Shingrix  Completed   HPV VACCINES  Aged Out   Immunization History  Administered Date(s) Administered   Fluad Quad(high Dose 65+) 05/30/2022   Influenza,inj,Quad PF,6+ Mos 04/18/2019, 08/05/2020, 05/31/2021   PFIZER(Purple Top)SARS-COV-2 Vaccination 09/26/2019, 10/30/2019, 11/20/2019   Tdap 09/02/2013   Zoster Recombinat (Shingrix) 08/29/2019, 02/26/2020   We updated and reviewed the patient's past history in detail and it is documented below. Allergies: Patient has No Known Allergies. Past Medical History Patient  has a past medical history of Abnormal Pap smear of cervix, Arthritis, Cervical cancer (HCC), Depression, GAD (generalized anxiety disorder), GERD (gastroesophageal reflux disease), History of migraine, History of radiation therapy, History of radiation therapy, History of transient ischemic attack (TIA) (01/2021), HLD (hyperlipidemia), Osteoporosis, Pain disorder, Scoliosis, and Torn ACL (anterior cruciate ligament) left hand (11/05/2021). Past Surgical History Patient  has a past surgical history that includes Cholecystectomy open (2005); Tendon repair (Left, 2008); Cervical conization w/bx (N/A, 08/04/2021); IR IMAGING GUIDED PORT INSERTION (09/08/2021); Tandem ring insertion (N/A, 10/25/2021); Operative ultrasound (N/A, 10/25/2021); Tandem ring insertion (N/A, 11/03/2021); Operative ultrasound (N/A, 11/03/2021); Tandem ring insertion (N/A, 11/10/2021); Operative ultrasound (N/A, 11/10/2021); Tandem ring insertion (N/A, 11/18/2021); Operative ultrasound (N/A, 11/18/2021); Tandem ring insertion (N/A, 11/22/2021); Operative ultrasound (N/A, 11/22/2021); and IR REMOVAL TUN  ACCESS W/ PORT W/O FL MOD SED (03/03/2022). Family History: Patient family history includes Alzheimer's disease in her mother; Bladder Cancer in her father; CAD in her maternal grandfather and maternal grandmother; Dementia in her mother; Healthy in her brother, brother, brother, son, and son; Mental illness in her mother; Stroke in her mother. Social History:  Patient  reports that she quit smoking about 27 years ago. Her smoking use included cigarettes. She has a 15.00 pack-year smoking history. She has never used smokeless tobacco. She reports current alcohol use of about 3.0 standard drinks of alcohol per week. She reports that she does not use drugs.  Review of Systems: Constitutional: negative for fever or malaise Ophthalmic: negative for photophobia, double vision or loss of vision Cardiovascular: negative for chest pain, dyspnea on exertion, or new LE swelling Respiratory: negative for SOB or persistent cough Gastrointestinal: negative for abdominal pain, change in bowel habits or melena Genitourinary: negative for dysuria or gross hematuria, no abnormal uterine bleeding or disharge Musculoskeletal: negative for new gait disturbance or muscular weakness Integumentary: negative for new or persistent rashes, no breast lumps Neurological: negative for TIA or stroke symptoms Psychiatric: negative for SI or delusions Allergic/Immunologic: negative for hives  Patient Care Team    Relationship Specialty Notifications Start End  Willow Ora, MD PCP - General Family Medicine  04/18/19   Key, Renea Ee  M, NP Nurse Practitioner Gynecology  09/02/13   Sedalia Muta, PT Physical Therapist Physical Therapy  09/15/19   Artis Delay, MD Consulting Physician Hematology and Oncology  02/08/22   Imogene Burn, MD Consulting Physician Gastroenterology  02/08/22     Objective  Vitals: BP 116/70   Pulse 82   Temp 98.7 F (37.1 C)   Ht 5\' 6"  (1.676 m)   Wt (!) 3.2 oz (0.091 kg)   LMP 08/21/2002    SpO2 94%   BMI 0.03 kg/m  General:  Well developed, well nourished, no acute distress  Psych:  Alert and orientedx3,normal mood and affect HEENT:  Normocephalic, atraumatic, non-icteric sclera,  supple neck without adenopathy, mass or thyromegaly Cardiovascular:  Normal S1, S2, RRR without gallop, rub or murmur Respiratory:  Good breath sounds bilaterally, CTAB with normal respiratory effort Gastrointestinal: normal bowel sounds, soft, non-tender, no noted masses. No HSM MSK: extremities without edema, joints without erythema or swelling Neurologic:    Mental status is normal.  Gross motor and sensory exams are normal.  No tremor  Commons side effects, risks, benefits, and alternatives for medications and treatment plan prescribed today were discussed, and the patient expressed understanding of the given instructions. Patient is instructed to call or message via MyChart if he/she has any questions or concerns regarding our treatment plan. No barriers to understanding were identified. We discussed Red Flag symptoms and signs in detail. Patient expressed understanding regarding what to do in case of urgent or emergency type symptoms.  Medication list was reconciled, printed and provided to the patient in AVS. Patient instructions and summary information was reviewed with the patient as documented in the AVS. This note was prepared with assistance of Dragon voice recognition software. Occasional wrong-word or sound-a-like substitutions may have occurred due to the inherent limitations of voice recognition software

## 2023-02-12 NOTE — Patient Instructions (Signed)

## 2023-02-13 ENCOUNTER — Other Ambulatory Visit (INDEPENDENT_AMBULATORY_CARE_PROVIDER_SITE_OTHER): Payer: Medicare Other

## 2023-02-13 DIAGNOSIS — E538 Deficiency of other specified B group vitamins: Secondary | ICD-10-CM

## 2023-02-13 DIAGNOSIS — C539 Malignant neoplasm of cervix uteri, unspecified: Secondary | ICD-10-CM | POA: Diagnosis not present

## 2023-02-13 DIAGNOSIS — E559 Vitamin D deficiency, unspecified: Secondary | ICD-10-CM | POA: Diagnosis not present

## 2023-02-13 DIAGNOSIS — E782 Mixed hyperlipidemia: Secondary | ICD-10-CM | POA: Diagnosis not present

## 2023-02-13 DIAGNOSIS — K219 Gastro-esophageal reflux disease without esophagitis: Secondary | ICD-10-CM

## 2023-02-13 DIAGNOSIS — F410 Panic disorder [episodic paroxysmal anxiety] without agoraphobia: Secondary | ICD-10-CM | POA: Diagnosis not present

## 2023-02-13 LAB — LIPID PANEL
Cholesterol: 99 mg/dL (ref 0–200)
HDL: 43.6 mg/dL (ref >39.00)
LDL Cholesterol: 42 mg/dL (ref 0–99)
NonHDL: 55.7
Total CHOL/HDL Ratio: 2
Triglycerides: 68 mg/dL (ref 0.0–149.0)
VLDL: 13.6 mg/dL (ref 0.0–40.0)

## 2023-02-13 LAB — CBC WITH DIFFERENTIAL/PLATELET
Basophils Absolute: 0 10*3/uL (ref 0.0–0.1)
Basophils Relative: 0.8 % (ref 0.0–3.0)
Eosinophils Absolute: 0.1 10*3/uL (ref 0.0–0.7)
Eosinophils Relative: 2.1 % (ref 0.0–5.0)
HCT: 38.6 % (ref 36.0–46.0)
Hemoglobin: 12.9 g/dL (ref 12.0–15.0)
Lymphocytes Relative: 18.1 % (ref 12.0–46.0)
Lymphs Abs: 0.8 10*3/uL (ref 0.7–4.0)
MCHC: 33.4 g/dL (ref 30.0–36.0)
MCV: 100.9 fl — ABNORMAL HIGH (ref 78.0–100.0)
Monocytes Absolute: 0.4 10*3/uL (ref 0.1–1.0)
Monocytes Relative: 8.5 % (ref 3.0–12.0)
Neutro Abs: 3.2 10*3/uL (ref 1.4–7.7)
Neutrophils Relative %: 70.5 % (ref 43.0–77.0)
Platelets: 281 10*3/uL (ref 150.0–400.0)
RBC: 3.82 Mil/uL — ABNORMAL LOW (ref 3.87–5.11)
RDW: 13.6 % (ref 11.5–15.5)
WBC: 4.5 10*3/uL (ref 4.0–10.5)

## 2023-02-13 LAB — CERVICOVAGINAL ANCILLARY ONLY
Bacterial Vaginitis (gardnerella): NEGATIVE
Candida Glabrata: NEGATIVE
Candida Vaginitis: NEGATIVE
Comment: NEGATIVE
Comment: NEGATIVE
Comment: NEGATIVE

## 2023-02-13 LAB — COMPREHENSIVE METABOLIC PANEL
ALT: 14 U/L (ref 0–35)
AST: 22 U/L (ref 0–37)
Albumin: 4.1 g/dL (ref 3.5–5.2)
Alkaline Phosphatase: 61 U/L (ref 39–117)
BUN: 16 mg/dL (ref 6–23)
CO2: 25 mEq/L (ref 19–32)
Calcium: 9.2 mg/dL (ref 8.4–10.5)
Chloride: 102 mEq/L (ref 96–112)
Creatinine, Ser: 0.8 mg/dL (ref 0.40–1.20)
GFR: 76.77 mL/min (ref >60.00)
Glucose, Bld: 97 mg/dL (ref 70–99)
Potassium: 4 mEq/L (ref 3.5–5.1)
Sodium: 135 mEq/L (ref 135–145)
Total Bilirubin: 0.7 mg/dL (ref 0.2–1.2)
Total Protein: 6.4 g/dL (ref 6.0–8.3)

## 2023-02-13 LAB — VITAMIN D 25 HYDROXY (VIT D DEFICIENCY, FRACTURES): VITD: 46.62 ng/mL (ref 30.00–100.00)

## 2023-02-13 LAB — VITAMIN B12: Vitamin B-12: 1027 pg/mL — ABNORMAL HIGH (ref 211–911)

## 2023-02-13 LAB — TSH: TSH: 1.31 u[IU]/mL (ref 0.35–5.50)

## 2023-02-13 NOTE — Addendum Note (Signed)
Addended by: Trudie Reed A on: 02/13/2023 10:36 AM   Modules accepted: Orders

## 2023-02-13 NOTE — Progress Notes (Signed)
See my chart note.

## 2023-02-14 NOTE — Progress Notes (Signed)
See mychart note Dear Ms. Taylor Buchanan, Your lab results all look good! No changes are needed at this time. Sincerely, Dr. Mardelle Matte

## 2023-02-15 IMAGING — CT CT ANGIO HEAD
2 of 12 series · 6 of 33 positions shown · IV contrast (omnipaque)
Comparison: None.

CLINICAL DATA: Initial evaluation for neuro deficit, stroke
suspected.

EXAM:
CT ANGIOGRAPHY HEAD AND NECK
TECHNIQUE: Multidetector CT imaging of the head and neck was performed using
the standard protocol during bolus administration of intravenous
contrast. Multiplanar CT image reconstructions and MIPs were
obtained to evaluate the vascular anatomy. Carotid stenosis
measurements (when applicable) are obtained utilizing NASCET
criteria, using the distal internal carotid diameter as the
denominator.
CONTRAST:  75mL OMNIPAQUE IOHEXOL 350 MG/ML SOLN

[Series 10: cta head & neck · axial · 0.34mm/px · z∈[-313,-104]mm · 4 of 732 slices shown]
[im 147/732  soft-tissue]
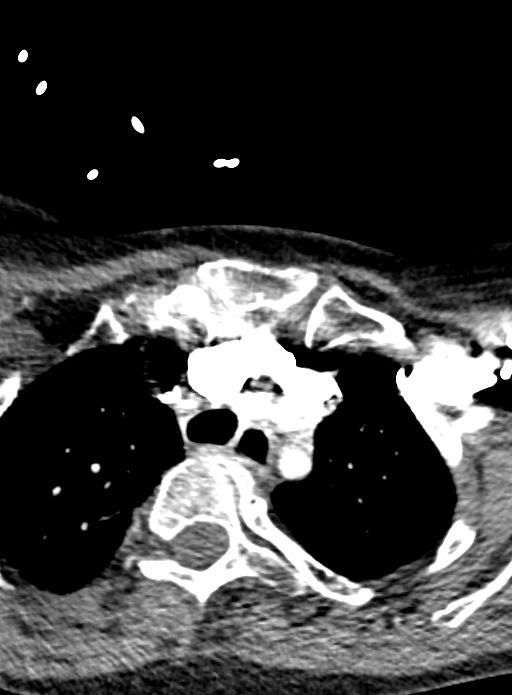
[im 293/732  bone]
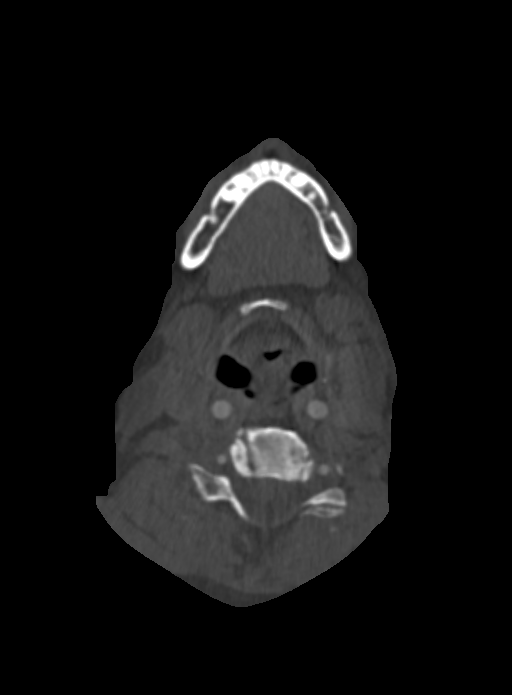
[im 439/732  soft-tissue]
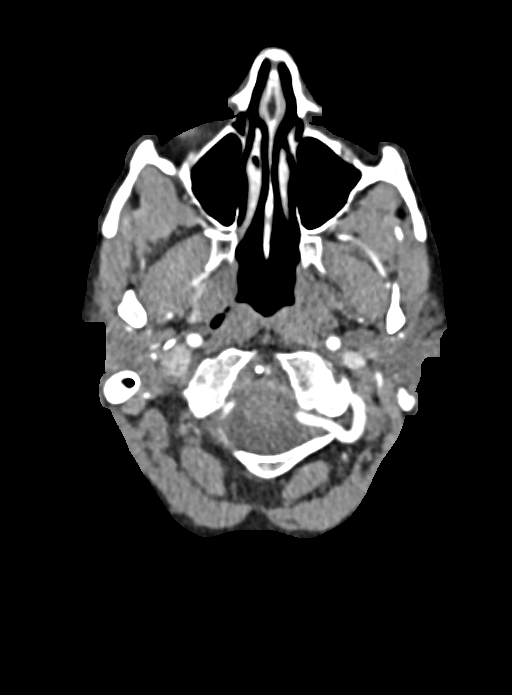
[im 585/732  bone]
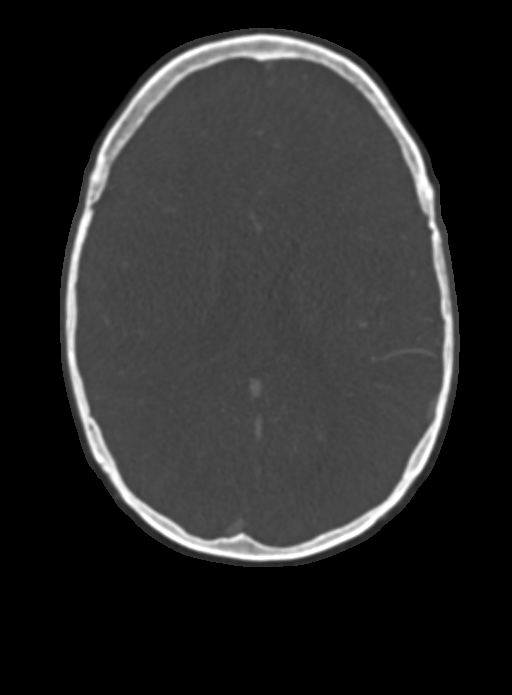

[Series 11: ax thin · axial · 0.34mm/px · z∈[-267,-151]mm · 2 of 366 slices shown]
[im 122/366  soft-tissue]
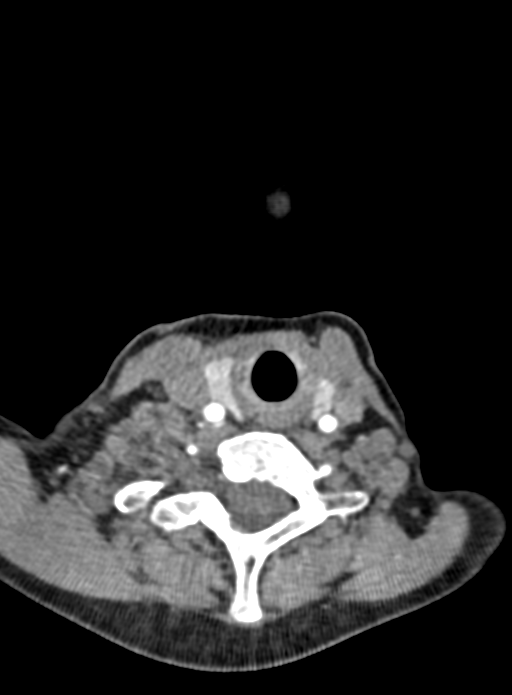
[im 244/366  soft-tissue]
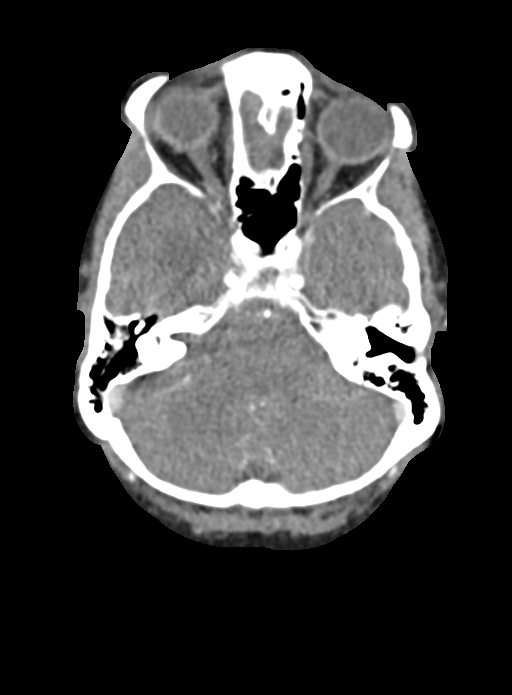

[6 of 33 positions shown; findings below may reference images not displayed]

FINDINGS: CT HEAD FINDINGS

Brain: Cerebral volume within normal limits for patient age. Small
remote right cerebellar infarct noted.

No evidence for acute intracranial hemorrhage. No findings to
suggest acute large vessel territory infarct. No mass lesion,
midline shift, or mass effect. Ventricles are normal in size without
evidence for hydrocephalus. No extra-axial fluid collection
identified.

Vascular: No hyperdense vessel identified.

Skull: Scalp soft tissues demonstrate no acute abnormality.
Calvarium intact.

Sinuses/Orbits: Globes and orbital soft tissues within normal
limits.

Visualized paranasal sinuses are clear. No mastoid effusion.

CTA NECK FINDINGS

Aortic arch: Arch visualized aortic arch of normal caliber with
normal 3 vessel morphology. No hemodynamically significant stenosis
about the origin of the great vessels.

Right carotid system: Right common and internal carotid arteries
widely patent without stenosis, dissection or occlusion.

Left carotid system: Left common and internal carotid arteries
widely patent without stenosis, dissection or occlusion.

Vertebral arteries: Both vertebral arteries arise from the
subclavian arteries. No proximal subclavian artery stenosis. Both
vertebral arteries widely patent without stenosis, dissection or
occlusion.

Skeleton: No visible acute osseous finding. No discrete or worrisome
osseous lesions. Cervicothoracic scoliosis noted.

Other neck: No other acute soft tissue abnormality within the neck.
No mass or adenopathy.

Upper chest: Visualized upper chest demonstrates no acute finding.

Review of the MIP images confirms the above findings

CTA HEAD FINDINGS

Anterior circulation: Both internal carotid arteries widely patent
to the termini without stenosis. A1 segments widely patent. Normal
anterior communicating artery complex. Both anterior cerebral
arteries widely patent to their distal aspects without stenosis. No
M1 stenosis or occlusion. Normal MCA bifurcations. Distal MCA
branches well perfused and symmetric.

Posterior circulation: Both V4 segments patent to the
vertebrobasilar junction without stenosis. Both PICA origins patent
and normal. Basilar widely patent to its distal aspect without
stenosis. Superior cerebellar arteries patent bilaterally. Left PCA
primarily supplied via the basilar. Predominant fetal type origin of
the right PCA. Both PCAs well perfused to their distal aspects
without stenosis.

Venous sinuses: Patent.

Anatomic variants: Fetal type origin of the right PCA.  No aneurysm.

Review of the MIP images confirms the above findings
IMPRESSION: 1. Negative CTA of the head and neck. No large vessel occlusion,
hemodynamically significant stenosis, or other acute vascular
abnormality.
2. Small remote right cerebellar infarct. No other acute
intracranial abnormality.

## 2023-02-16 IMAGING — DX DG CHEST 1V PORT
1 series · 1 of 1 positions shown · non-contrast
Comparison: None.

CLINICAL DATA: Hypoxia

EXAM:
PORTABLE CHEST 1 VIEW

[chest]
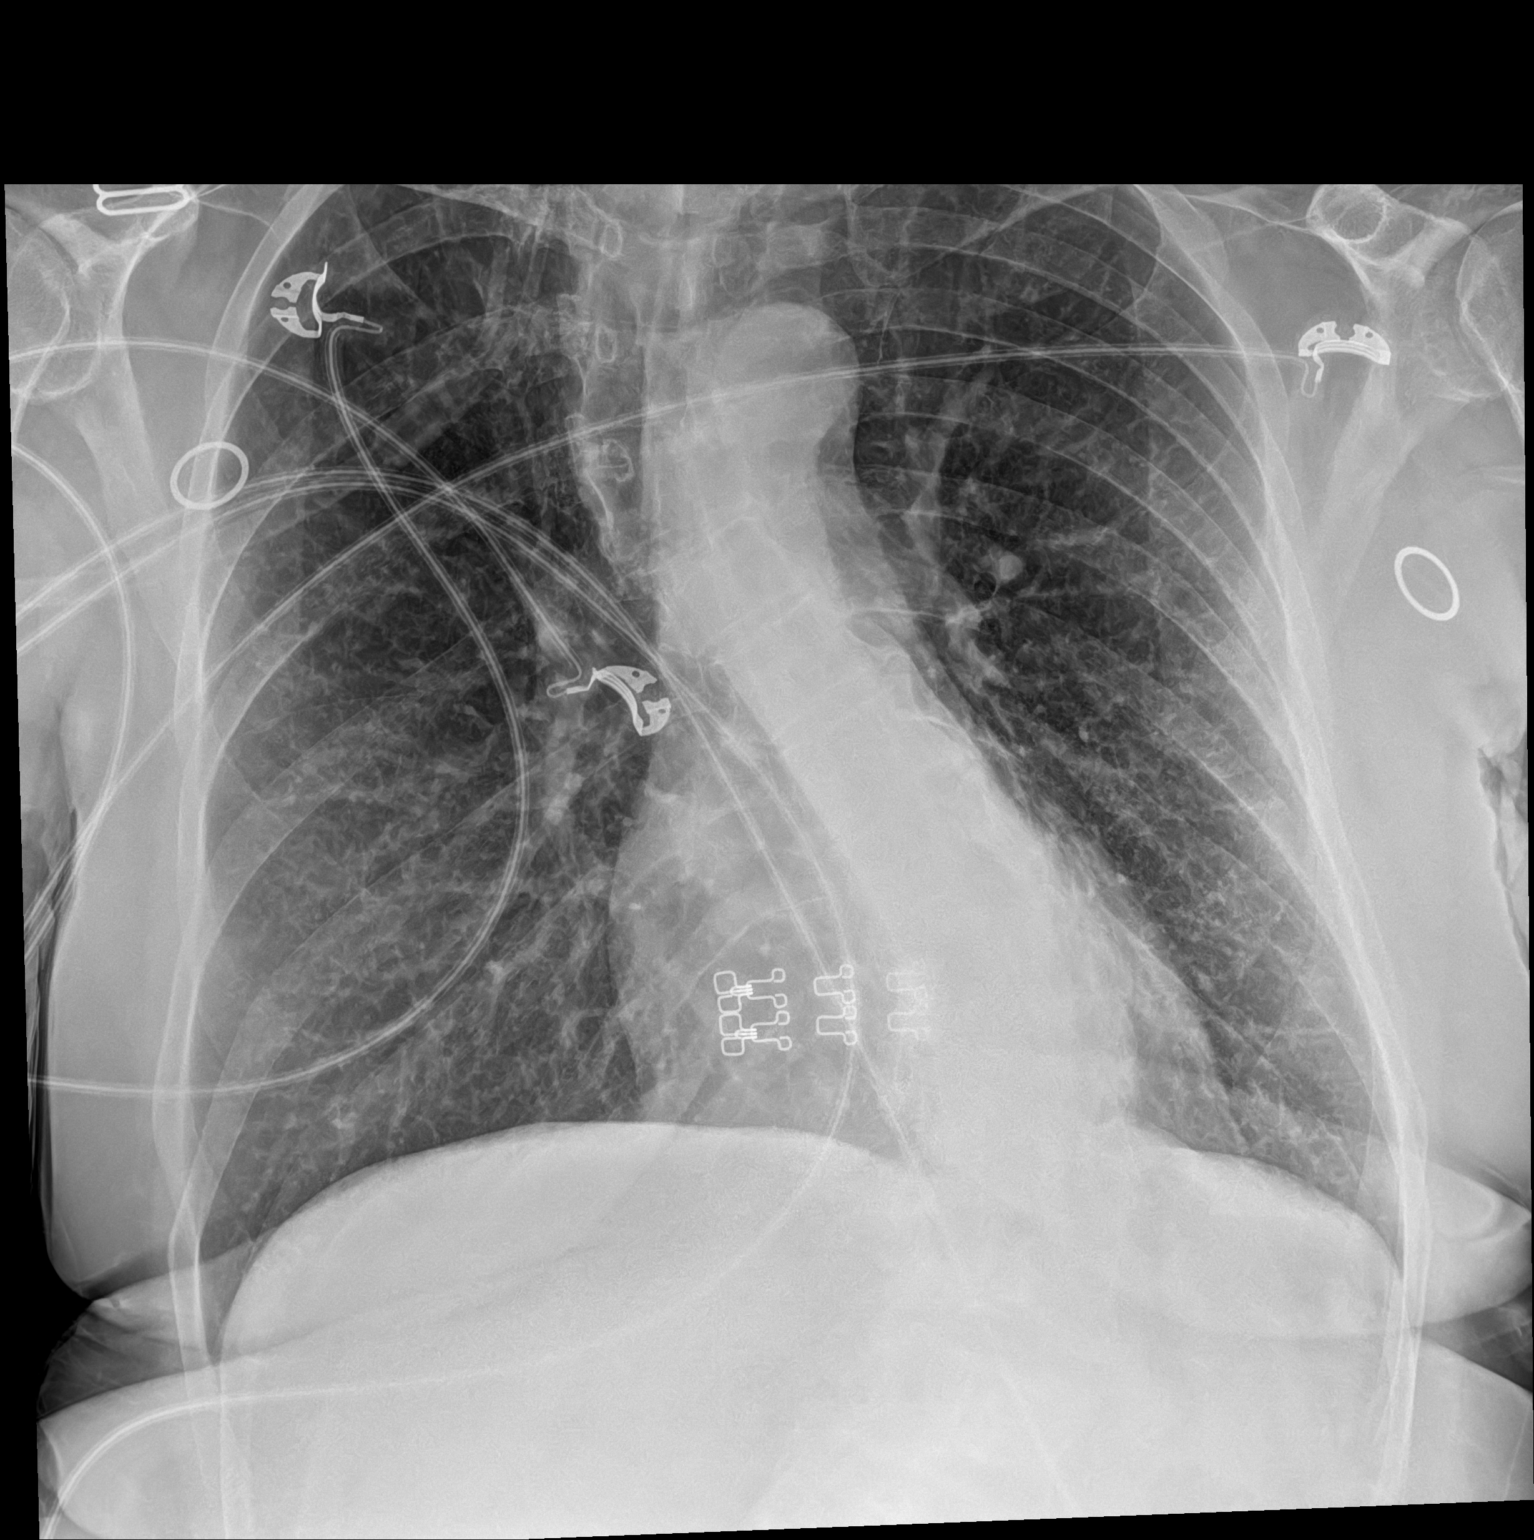

[1 of 1 positions shown; findings below may reference images not displayed]

FINDINGS: Thoracolumbar scoliosis. Hyperinflation of the lungs. Heart is
normal size. No confluent opacities or effusions. No acute bony
abnormality.
IMPRESSION: Hyperinflation.  No active disease.

## 2023-03-08 NOTE — Progress Notes (Signed)
Gynecologic Oncology Return Clinic Visit  03/09/23  Reason for Visit: surveillance  Treatment History: Oncology History  Malignant neoplasm of cervix (HCC)  07/05/2021 Pathology Results   A. ENDOMETRIAL CURETTINGS:  Blood clots containing numerous strips of squamous epithelium with features of high-grade SIL(CIN-3/severe dysplasia).  Normal endocervical tissue or endometrial tissue are not identified.   B. CERVIX, CONE BIOPSY:  Invasive squamous cell carcinoma, focally keratinizing and moderately differentiated with circumferential involvement.  All margins including the deep margin are positive for squamous cell carcinoma.  Please see comment. P16 positive   08/16/2021 Initial Diagnosis   Malignant neoplasm of cervix (HCC)   08/30/2021 PET scan   Focal hypermetabolism in the region of the cervix, consistent with known primary cervical carcinoma.   No evidence of pelvic lymph node or distant metastatic disease.     09/01/2021 Cancer Staging   Staging form: Cervix Uteri, AJCC Version 9 - Clinical stage from 09/01/2021: FIGO Stage IB1 (cT1b1, cN0, cM0) - Signed by Artis Delay, MD on 09/01/2021 Stage prefix: Initial diagnosis   09/08/2021 Procedure   Successful placement of a right internal jugular approach power injectable Port-A-Cath. The catheter is ready for immediate use.     09/12/2021 - 11/22/2021 Radiation Therapy   IMRT pelvis: 09/12/21 through 10/14/21  Brachytherapy cevix: 10/25/21 through 11/22/21   Site Technique Total Dose (Gy) Dose per Fx (Gy) Completed Fx Beam Energies  Pelvis: Pelvis IMRT 45/45 1.8 25/25 6X  Cervix: Cervix_Bst_Fx1 HDR-brachy 5.5/5.5 5.5 1/1 Ir-192  Cervix: Cervix_Bst_Fx2 HDR-brachy 5.5/5.5 5.5 1/1 Ir-192  Cervix: Cervix_Bst_Fx3 HDR-brachy 5.5/5.5 5.5 1/1 Ir-192  Cervix: Cervix_Bst_Fx4 HDR-brachy 5.5/5.5 5.5 1/1 Ir-192  Cervix: Cervix_Bst_Fx5 HDR-brachy 5.5/5.5 5.5 1/1 Ir-192       09/13/2021 - 10/11/2021 Chemotherapy   Patient is on Treatment  Plan : cervical Cisplatin q7d      02/23/2022 PET scan   Marked interval response to therapy with reduction of metabolic activity in the area of the cervix. Residual activity only minimally greater than mediastinal blood pool, attention on follow-up.   No signs of metastatic disease.   03/03/2022 Procedure   Removal of implanted Port-A-Cath utilizing sharp and blunt dissection. The procedure was uncomplicated.     Interval History: Doing well. Had some watery discharge recently while traveling an one spot of blood after intercourse. Had minimal discomfort at top left of her vagina after intercourse. Symptoms resolve shortly after her phone call. Denies any recent symptoms. Reports normal bowel and bladder function.   Past Medical/Surgical History: Past Medical History:  Diagnosis Date   Abnormal Pap smear of cervix    Arthritis    Cervical cancer (HCC)    Depression    GAD (generalized anxiety disorder)    GERD (gastroesophageal reflux disease)    History of migraine    History of radiation therapy    cervix - pelvis  09/12/2021-10/14/2021 Dr Antony Blackbird   History of radiation therapy    cervix - HDR brachy tandem and ring 10/25/2021-11/22/2021  Dr Antony Blackbird   History of transient ischemic attack (TIA) 01/2021   neruologist--- dr Pearlean Brownie;  [facility] in [facility] ,  per note complicated migraine vs anxiety,  less likely TIA   HLD (hyperlipidemia)    Osteoporosis    Pain disorder    Scoliosis    Torn ACL (anterior cruciate ligament) left hand 11/05/2021   wearing splint seeing ortho md week of 11-14-2021    Past Surgical History:  Procedure Laterality Date   CERVICAL CONIZATION W/BX  N/A 08/04/2021   Procedure: CONIZATION CERVIX WITH BIOPSY: DILATION AND CURETTAGE OF UTERUS;  Surgeon: Edwinna Areola, DO;  Location: [facility];  Service: Gynecology;  Laterality: N/A;   CHOLECYSTECTOMY OPEN  2005   approx   IR IMAGING GUIDED PORT INSERTION  09/08/2021   IR  REMOVAL TUN ACCESS W/ PORT W/O FL MOD SED  03/03/2022   OPERATIVE ULTRASOUND N/A 10/25/2021   Procedure: OPERATIVE ULTRASOUND;  Surgeon: Antony Blackbird, MD;  Location: [facility];  Service: Urology;  Laterality: N/A;   OPERATIVE ULTRASOUND N/A 11/03/2021   Procedure: OPERATIVE ULTRASOUND;  Surgeon: Antony Blackbird, MD;  Location: [facility];  Service: Urology;  Laterality: N/A;   OPERATIVE ULTRASOUND N/A 11/10/2021   Procedure: OPERATIVE ULTRASOUND;  Surgeon: Antony Blackbird, MD;  Location: [facility];  Service: Urology;  Laterality: N/A;   OPERATIVE ULTRASOUND N/A 11/18/2021   Procedure: OPERATIVE ULTRASOUND;  Surgeon: Antony Blackbird, MD;  Location: [facility];  Service: Urology;  Laterality: N/A;   OPERATIVE ULTRASOUND N/A 11/22/2021   Procedure: OPERATIVE ULTRASOUND;  Surgeon: Antony Blackbird, MD;  Location: [facility];  Service: Urology;  Laterality: N/A;   TANDEM RING INSERTION N/A 10/25/2021   Procedure: TANDEM RING INSERTION;  Surgeon: Antony Blackbird, MD;  Location: [facility];  Service: Urology;  Laterality: N/A;   TANDEM RING INSERTION N/A 11/03/2021   Procedure: TANDEM RING INSERTION;  Surgeon: Antony Blackbird, MD;  Location: [facility];  Service: Urology;  Laterality: N/A;   TANDEM RING INSERTION N/A 11/10/2021   Procedure: TANDEM RING INSERTION;  Surgeon: Antony Blackbird, MD;  Location: [facility];  Service: Urology;  Laterality: N/A;   TANDEM RING INSERTION N/A 11/18/2021   Procedure: TANDEM RING INSERTION;  Surgeon: Antony Blackbird, MD;  Location: [facility];  Service: Urology;  Laterality: N/A;   TANDEM RING INSERTION N/A 11/22/2021   Procedure: TANDEM RING INSERTION;  Surgeon: Antony Blackbird, MD;  Location: [facility];  Service: Urology;  Laterality: N/A;   TENDON REPAIR Left 2008   Ankle/ foot    Family History  Problem Relation Age of Onset    Mental illness Mother    Stroke Mother    Dementia Mother    Alzheimer's disease Mother    Bladder Cancer Father    Healthy Brother    Healthy Brother    Healthy Brother    CAD Maternal Grandmother    CAD Maternal Grandfather    Healthy Son    Healthy Son    Colon cancer Neg Hx    Breast cancer Neg Hx    Ovarian cancer Neg Hx    Endometrial cancer Neg Hx    Pancreatic cancer Neg Hx    Prostate cancer Neg Hx     Social History   Socioeconomic History   Marital status: Married    Spouse name: Not on file   Number of children: 2   Years of education: Not on file   Highest education level: Not on file  Occupational History    Employer: [employer]    Comment: retired   Occupation: retired  Tobacco Use   Smoking status: Former    Current packs/day: 0.00    Average packs/day: 1 pack/day for 15.0 years (15.0 ttl pk-yrs)    Types: Cigarettes    Start date: 67    Quit date: 1997    Years since quitting: 27.5  Smokeless tobacco: Never  Vaping Use   Vaping status: Never Used  Substance and Sexual Activity   Alcohol use: Yes    Alcohol/week: 3.0 standard drinks of alcohol    Types: 3 Standard drinks or equivalent per week    Comment: occ   Drug use: Never   Sexual activity: Yes    Birth control/protection: Post-menopausal  Other Topics Concern   Not on file  Social History Narrative   Work or School: [employer]      Home Situation: lives with husband      Spiritual Beliefs: Christian      Lifestyle: no regular exercise; diet good            Social Determinants of Corporate investment banker Strain: Not on file  Food Insecurity: Not on file  Transportation Needs: Not on file  Physical Activity: Not on file  Stress: Not on file  Social Connections: Unknown (09/08/2022)   Social Connection and Isolation Panel [NHANES]    Frequency of Communication with Friends and Family: Not on file    Frequency of Social Gatherings with Friends and  Family: Not on file    Attends Religious Services: 1 to 4 times per year    Active Member of [employer] or Organizations: Not on file    Attends Banker Meetings: Not on file    Marital Status: Not on file    Current Medications:  Current Outpatient Medications:    estradiol (ESTRACE VAGINAL) 0.1 MG/GM vaginal cream, Place 1 Applicatorful vaginally at bedtime. Apply a small amount (using your finger) inside the vagina twice a week at night, Disp: 42.5 g, Rfl: 12   alendronate (FOSAMAX) 70 MG tablet, TAKE 1 TABLET EVERY WEEK WITH FULL GLASS OF WATER ON EMPTY STOMACH. SIT UPRIGHT FOR 30 MINUTES, Disp: 12 tablet, Rfl: 3   aspirin EC 81 MG EC tablet, Take 1 tablet (81 mg total) by mouth daily. Swallow whole., Disp: 30 tablet, Rfl: 11   atorvastatin (LIPITOR) 40 MG tablet, TAKE 1 TABLET BY MOUTH AT  BEDTIME, Disp: 90 tablet, Rfl: 3   Calcium Carbonate (CALCIUM-CARB 600 PO), Take 1 tablet by mouth daily., Disp: , Rfl:    cholecalciferol (VITAMIN D) 1000 UNITS tablet, Take 1,000 Units by mouth daily., Disp: , Rfl:    escitalopram (LEXAPRO) 10 MG tablet, TAKE 1 TABLET BY MOUTH DAILY, Disp: 90 tablet, Rfl: 3   pantoprazole (PROTONIX) 20 MG tablet, TAKE 1 TABLET BY MOUTH DAILY, Disp: 90 tablet, Rfl: 3  Review of Systems: Denies appetite changes, fevers, chills, fatigue, unexplained weight changes. Denies hearing loss, neck lumps or masses, mouth sores, ringing in ears or voice changes. Denies cough or wheezing.  Denies shortness of breath. Denies chest pain or palpitations. Denies leg swelling. Denies abdominal distention, pain, blood in stools, constipation, diarrhea, nausea, vomiting, or early satiety. Denies pain with intercourse, dysuria, frequency, hematuria or incontinence. Denies hot flashes, pelvic pain, vaginal bleeding or vaginal discharge.   Denies joint pain, back pain or muscle pain/cramps. Denies itching, rash, or wounds. Denies dizziness, headaches, numbness or  seizures. Denies swollen lymph nodes or glands, denies easy bruising or bleeding. Denies anxiety, depression, confusion, or decreased concentration.  Physical Exam: BP 109/75 (BP Location: Left Arm, Patient Position: Sitting)   Pulse 60   Temp 98.1 F (36.7 C) (Oral)   Resp 16   Wt 175 lb 12.8 oz (79.7 kg)   LMP 08/21/2002   SpO2 97%   BMI  28.37 kg/m  General: Alert, oriented, no acute distress. HEENT: Normocephalic, atraumatic, sclera anicteric. Chest: Clear to auscultation bilaterally.  No wheezes or rhonchi. Cardiovascular: Regular rate and rhythm, no murmurs. Abdomen: soft, nontender.  Normoactive bowel sounds.  No masses or hepatosplenomegaly appreciated.   Extremities: Grossly normal range of motion.  Warm, well perfused.  No edema bilaterally. Skin: No rashes or lesions noted. Lymphatics: No cervical, supraclavicular, or inguinal adenopathy. GU: Normal appearing external genitalia without erythema, excoriation, or lesions.  Speculum exam reveals radiation changes at the vaginal apex and cervix, moderate atrophy also noted.  Small area likely c/w necrosis at left aspect of the vaginal apex.  No friable tissue noted.  Bimanual exam reveals cervix is flush with the vaginal apex long the posterior aspect, no firmness or nodularity.  Rectovaginal exam confirms these findings, no parametrial nodularity or thickening.  Laboratory & Radiologic Studies: Pap 10/2022 - NIML, HR HPV negative  Assessment & Plan: Taylor Buchanan is a 67 y.o. woman with Stage IB2 SCC who presents for follow-up after completion of adjuvant chemoradiation in 11/2021.   Patient is doing well and is NED on exam today.   Has started physical floor PT which has been helpful for her urinary symptoms.  Reviewed recent pap that was NIML, HR HPV negative. We will plan to perform yearly cotesting.   Based on exam today, I think there is a small area of tissue necrosis. This may have contributed to recent episode of  discharge/drainage. Plan to start vaginal estrogen.  Discussed signs and symptoms that would be concerning for cancer recurrence, and I stressed the importance of calling if she develops any of these before her next visit.   22 minutes of total time was spent for this patient encounter, including preparation, face-to-face counseling with the patient and coordination of care, and documentation of the encounter.  Eugene Garnet, MD  Division of Gynecologic Oncology  Department of Obstetrics and Gynecology  [facility]

## 2023-03-09 ENCOUNTER — Encounter: Payer: Self-pay | Admitting: Internal Medicine

## 2023-03-09 ENCOUNTER — Encounter: Payer: Self-pay | Admitting: Gynecologic Oncology

## 2023-03-09 ENCOUNTER — Inpatient Hospital Stay: Payer: Medicare Other | Attending: Gynecologic Oncology | Admitting: Gynecologic Oncology

## 2023-03-09 VITALS — BP 109/75 | HR 60 | Temp 98.1°F | Resp 16 | Wt 175.8 lb

## 2023-03-09 DIAGNOSIS — Z08 Encounter for follow-up examination after completed treatment for malignant neoplasm: Secondary | ICD-10-CM | POA: Diagnosis present

## 2023-03-09 DIAGNOSIS — Z9221 Personal history of antineoplastic chemotherapy: Secondary | ICD-10-CM | POA: Diagnosis not present

## 2023-03-09 DIAGNOSIS — C539 Malignant neoplasm of cervix uteri, unspecified: Secondary | ICD-10-CM

## 2023-03-09 DIAGNOSIS — N952 Postmenopausal atrophic vaginitis: Secondary | ICD-10-CM

## 2023-03-09 DIAGNOSIS — Z8541 Personal history of malignant neoplasm of cervix uteri: Secondary | ICD-10-CM | POA: Insufficient documentation

## 2023-03-09 DIAGNOSIS — Z923 Personal history of irradiation: Secondary | ICD-10-CM | POA: Insufficient documentation

## 2023-03-09 MED ORDER — ESTRADIOL 0.1 MG/GM VA CREA: 1.0000 | TOPICAL_CREAM | Freq: Every day | VAGINAL | 12 refills | Status: AC

## 2023-03-09 NOTE — Patient Instructions (Signed)
It was good to see you today.  I do not see or feel any evidence of cancer recurrence on your exam.  I will see you for follow-up in 6 months.  I sent the prescription into your pharmacy for vaginal estrogen.  As always, if you develop any new and concerning symptoms before your next visit, please call to see me sooner.

## 2023-04-05 ENCOUNTER — Ambulatory Visit (AMBULATORY_SURGERY_CENTER): Payer: Medicare Other

## 2023-04-05 VITALS — Ht 66.0 in | Wt 172.0 lb

## 2023-04-05 DIAGNOSIS — Z1211 Encounter for screening for malignant neoplasm of colon: Secondary | ICD-10-CM

## 2023-04-05 MED ORDER — NA SULFATE-K SULFATE-MG SULF 17.5-3.13-1.6 GM/177ML PO SOLN: 1.0000 | Freq: Once | ORAL | 0 refills | Status: AC

## 2023-04-05 NOTE — Progress Notes (Signed)
 No egg or soy allergy known to patient  No issues known to pt with past sedation with any surgeries or procedures Patient denies ever being told they had issues or difficulty with intubation  No FH of Malignant Hyperthermia Pt is not on diet pills Pt is not on  home 02  Pt is not on blood thinners  Pt denies issues with chronic constipation  No A fib or A flutter Have any cardiac testing pending--no Patient's chart reviewed by Cathlyn Parsons CNRA prior to previsit and patient appropriate for the [facility].  Previsit completed and red dot placed by patient's name on their procedure day (on provider's schedule).   Ambulates independently Pt instructed to use [email or web address] or GoodRx for a price reduction on prep

## 2023-04-25 ENCOUNTER — Encounter: Payer: Self-pay | Admitting: Internal Medicine

## 2023-04-25 ENCOUNTER — Ambulatory Visit (AMBULATORY_SURGERY_CENTER): Payer: Medicare Other | Admitting: Internal Medicine

## 2023-04-25 VITALS — BP 93/63 | HR 57 | Temp 97.3°F | Resp 14 | Ht 66.0 in | Wt 172.0 lb

## 2023-04-25 DIAGNOSIS — D128 Benign neoplasm of rectum: Secondary | ICD-10-CM

## 2023-04-25 DIAGNOSIS — K635 Polyp of colon: Secondary | ICD-10-CM

## 2023-04-25 DIAGNOSIS — Z1211 Encounter for screening for malignant neoplasm of colon: Secondary | ICD-10-CM | POA: Diagnosis not present

## 2023-04-25 DIAGNOSIS — K621 Rectal polyp: Secondary | ICD-10-CM

## 2023-04-25 DIAGNOSIS — D122 Benign neoplasm of ascending colon: Secondary | ICD-10-CM

## 2023-04-25 MED ORDER — SODIUM CHLORIDE 0.9 % IV SOLN: 500.0000 mL | INTRAVENOUS | Status: DC

## 2023-04-25 NOTE — Op Note (Signed)
[facility] Patient Name: Taylor Buchanan Procedure Date: 04/25/2023 11:19 AM MRN: [id number] Endoscopist: Taylor Buchanan , , [id number] Age: 67 Referring MD:  Date of Birth: Dec 17, 1955 Gender: Female Account #: [id number] Procedure:                Colonoscopy Indications:              Screening for colorectal malignant neoplasm Medicines:                Monitored Anesthesia Care Procedure:                Pre-Anesthesia Assessment:                           - Prior to the procedure, a History and Physical                            was performed, and patient medications and                            allergies were reviewed. The patient's tolerance of                            previous anesthesia was also reviewed. The risks                            and benefits of the procedure and the sedation                            options and risks were discussed with the patient.                            All questions were answered, and informed consent                            was obtained. Prior Anticoagulants: The patient has                            taken no anticoagulant or antiplatelet agents. ASA                            Grade Assessment: II - A patient with mild systemic                            disease. After reviewing the risks and benefits,                            the patient was deemed in satisfactory condition to                            undergo the procedure.                           After obtaining informed consent, the colonoscope  was passed under direct vision. Throughout the                            procedure, the patient's blood pressure, pulse, and                            oxygen saturations were monitored continuously. The                            Olympus Scope SN: [id number] was introduced through                            the anus and advanced to the the terminal ileum.                            The  colonoscopy was performed without difficulty.                            The patient tolerated the procedure well. The                            quality of the bowel preparation was good. The                            terminal ileum, ileocecal valve, appendiceal                            orifice, and rectum were photographed. Scope In: 11:36:11 AM Scope Out: 11:53:57 AM Scope Withdrawal Time: 0 hours 13 minutes 40 seconds  Total Procedure Duration: 0 hours 17 minutes 46 seconds  Findings:                 The terminal ileum appeared normal.                           A 3 mm polyp was found in the ascending colon. The                            polyp was sessile. The polyp was removed with a                            cold snare. Resection and retrieval were complete.                           A 4 mm polyp was found in the rectum. The polyp was                            sessile. The polyp was removed with a cold snare.                            Resection and retrieval were complete.  Non-bleeding internal hemorrhoids were found during                            retroflexion. Complications:            No immediate complications. Estimated Blood Loss:     Estimated blood loss was minimal. Impression:               - The examined portion of the ileum was normal.                           - One 3 mm polyp in the ascending colon, removed                            with a cold snare. Resected and retrieved.                           - One 4 mm polyp in the rectum, removed with a cold                            snare. Resected and retrieved.                           - Non-bleeding internal hemorrhoids. Recommendation:           - Discharge patient to home (with escort).                           - Await pathology results.                           - The findings and recommendations were discussed                            with the patient. Dr Taylor Buchanan  "Taylor Buchanan" Taylor Buchanan,  04/25/2023 11:57:41 AM

## 2023-04-25 NOTE — Progress Notes (Signed)
GASTROENTEROLOGY PROCEDURE H&P NOTE   Primary Care Physician: Willow Ora, MD    Reason for Procedure:   Colon cancer screening  Plan:    Colonoscopy  Patient is appropriate for endoscopic procedure(s) in the ambulatory ([facility]) setting.  The nature of the procedure, as well as the risks, benefits, and alternatives were carefully and thoroughly reviewed with the patient. Ample time for discussion and questions allowed. The patient understood, was satisfied, and agreed to proceed.     HPI: Taylor Buchanan is a 67 y.o. female who presents for colonoscopy for colon cancer screening. Denies blood in stools, changes in bowel habits, or unintentional weight loss. Denies family history of colon cancer. Last colonoscopy 07/2012 showed excellent prep and normal colon.   Past Medical History:  Diagnosis Date   Abnormal Pap smear of cervix    Arthritis    Cervical cancer (HCC)    Depression    GAD (generalized anxiety disorder)    GERD (gastroesophageal reflux disease)    History of migraine    History of radiation therapy    cervix - pelvis  09/12/2021-10/14/2021 Dr Antony Blackbird   History of radiation therapy    cervix - HDR brachy tandem and ring 10/25/2021-11/22/2021  Dr Antony Blackbird   History of transient ischemic attack (TIA) 01/2021   neruologist--- dr Pearlean Brownie;  ad[facility]/ 2022 in [facility] ,  per note complicated migraine vs anxiety,  less likely TIA   HLD (hyperlipidemia)    Osteoporosis    Pain disorder    Scoliosis    Stroke (HCC) 2022   TIA   Torn ACL (anterior cruciate ligament) left hand 11/05/2021   wearing splint seeing ortho md week of 11-14-2021    Past Surgical History:  Procedure Laterality Date   CERVICAL CONIZATION W/BX N/A 08/04/2021   Procedure: CONIZATION CERVIX WITH BIOPSY: DILATION AND CURETTAGE OF UTERUS;  Surgeon: Edwinna Areola, DO;  Location: [facility];  Service: Gynecology;  Laterality: N/A;   CHOLECYSTECTOMY OPEN  2005   approx    IR IMAGING GUIDED PORT INSERTION  09/08/2021   IR REMOVAL TUN ACCESS W/ PORT W/O FL MOD SED  03/03/2022   OPERATIVE ULTRASOUND N/A 10/25/2021   Procedure: OPERATIVE ULTRASOUND;  Surgeon: Antony Blackbird, MD;  Location: [facility];  Service: Urology;  Laterality: N/A;   OPERATIVE ULTRASOUND N/A 11/03/2021   Procedure: OPERATIVE ULTRASOUND;  Surgeon: Antony Blackbird, MD;  Location: [facility];  Service: Urology;  Laterality: N/A;   OPERATIVE ULTRASOUND N/A 11/10/2021   Procedure: OPERATIVE ULTRASOUND;  Surgeon: Antony Blackbird, MD;  Location: [facility];  Service: Urology;  Laterality: N/A;   OPERATIVE ULTRASOUND N/A 11/18/2021   Procedure: OPERATIVE ULTRASOUND;  Surgeon: Antony Blackbird, MD;  Location: [facility];  Service: Urology;  Laterality: N/A;   OPERATIVE ULTRASOUND N/A 11/22/2021   Procedure: OPERATIVE ULTRASOUND;  Surgeon: Antony Blackbird, MD;  Location: [facility];  Service: Urology;  Laterality: N/A;   TANDEM RING INSERTION N/A 10/25/2021   Procedure: TANDEM RING INSERTION;  Surgeon: Antony Blackbird, MD;  Location: [facility];  Service: Urology;  Laterality: N/A;   TANDEM RING INSERTION N/A 11/03/2021   Procedure: TANDEM RING INSERTION;  Surgeon: Antony Blackbird, MD;  Location: [facility];  Service: Urology;  Laterality: N/A;   TANDEM RING INSERTION N/A 11/10/2021   Procedure: TANDEM RING INSERTION;  Surgeon: Antony Blackbird, MD;  Location: [facility];  Service: Urology;  Laterality: N/A;   TANDEM RING INSERTION N/A 11/18/2021   Procedure: TANDEM RING INSERTION;  Surgeon: Antony Blackbird, MD;  Location: [facility];  Service: Urology;  Laterality: N/A;   TANDEM RING INSERTION N/A 11/22/2021   Procedure: TANDEM RING INSERTION;  Surgeon: Antony Blackbird, MD;  Location: [facility];  Service: Urology;  Laterality: N/A;   TENDON REPAIR Left 2008   Ankle/ foot     Prior to Admission medications   Medication Sig Start Date End Date Taking? Authorizing Provider  alendronate (FOSAMAX) 70 MG tablet TAKE 1 TABLET EVERY WEEK WITH FULL GLASS OF WATER ON EMPTY STOMACH. SIT UPRIGHT FOR 30 MINUTES 01/24/23  Yes Willow Ora, MD  aspirin EC 81 MG EC tablet Take 1 tablet (81 mg total) by mouth daily. Swallow whole. 02/01/21  Yes Wouk, Wilfred Curtis, MD  atorvastatin (LIPITOR) 40 MG tablet TAKE 1 TABLET BY MOUTH AT  BEDTIME 08/23/22  Yes Willow Ora, MD  Calcium Carbonate (CALCIUM-CARB 600 PO) Take 1 tablet by mouth daily.   Yes [provider]  cholecalciferol (VITAMIN D) 1000 UNITS tablet Take 1,000 Units by mouth daily.   Yes [provider]  estradiol (ESTRACE VAGINAL) 0.1 MG/GM vaginal cream Place 1 Applicatorful vaginally at bedtime. Apply a small amount (using your finger) inside the vagina twice a week at night 03/09/23  Yes Carver Fila, MD  pantoprazole (PROTONIX) 20 MG tablet TAKE 1 TABLET BY MOUTH DAILY 08/23/22  Yes Willow Ora, MD  escitalopram (LEXAPRO) 10 MG tablet TAKE 1 TABLET BY MOUTH DAILY Patient not taking: Reported on 04/05/2023 08/23/22   Willow Ora, MD    Current Outpatient Medications  Medication Sig Dispense Refill   alendronate (FOSAMAX) 70 MG tablet TAKE 1 TABLET EVERY WEEK WITH FULL GLASS OF WATER ON EMPTY STOMACH. SIT UPRIGHT FOR 30 MINUTES 12 tablet 3   aspirin EC 81 MG EC tablet Take 1 tablet (81 mg total) by mouth daily. Swallow whole. 30 tablet 11   atorvastatin (LIPITOR) 40 MG tablet TAKE 1 TABLET BY MOUTH AT  BEDTIME 90 tablet 3   Calcium Carbonate (CALCIUM-CARB 600 PO) Take 1 tablet by mouth daily.     cholecalciferol (VITAMIN D) 1000 UNITS tablet Take 1,000 Units by mouth daily.     estradiol (ESTRACE VAGINAL) 0.1 MG/GM vaginal cream Place 1 Applicatorful vaginally at bedtime. Apply a small amount (using your finger) inside the vagina twice a week at night 42.5 g 12   pantoprazole (PROTONIX) 20  MG tablet TAKE 1 TABLET BY MOUTH DAILY 90 tablet 3   escitalopram (LEXAPRO) 10 MG tablet TAKE 1 TABLET BY MOUTH DAILY (Patient not taking: Reported on 04/05/2023) 90 tablet 3   Current Facility-Administered Medications  Medication Dose Route Frequency Provider Last Rate Last Admin   0.9 %  sodium chloride infusion  500 mL Intravenous Continuous Imogene Burn, MD        Allergies as of 04/25/2023   (No Known Allergies)    Family History  Problem Relation Age of Onset   Mental illness Mother    Stroke Mother    Dementia Mother    Alzheimer's disease Mother    Bladder Cancer Father    Healthy Brother    Healthy Brother    Healthy Brother    CAD Maternal Grandmother    CAD Maternal Grandfather    Healthy Son    Healthy Son    Colon cancer Neg Hx  Breast cancer Neg Hx    Ovarian cancer Neg Hx    Endometrial cancer Neg Hx    Pancreatic cancer Neg Hx    Prostate cancer Neg Hx    Colon polyps Neg Hx    Esophageal cancer Neg Hx    Stomach cancer Neg Hx    Rectal cancer Neg Hx     Social History   Socioeconomic History   Marital status: Married    Spouse name: Not on file   Number of children: 2   Years of education: Not on file   Highest education level: Not on file  Occupational History    Employer: [employer]    Comment: retired   Occupation: retired  Tobacco Use   Smoking status: Former    Current packs/day: 0.00    Average packs/day: 1 pack/day for 15.0 years (15.0 ttl pk-yrs)    Types: Cigarettes    Start date: 79    Quit date: 1997    Years since quitting: 27.6   Smokeless tobacco: Never  Vaping Use   Vaping status: Never Used  Substance and Sexual Activity   Alcohol use: Yes    Alcohol/week: 3.0 standard drinks of alcohol    Types: 3 Cans of beer per week    Comment: social   Drug use: Yes    Types: Marijuana    Comment: 1-2 times over past year   Sexual activity: Yes    Birth control/protection: Post-menopausal  Other Topics Concern    Not on file  Social History Narrative   Work or School: [employer]      Home Situation: lives with husband      Spiritual Beliefs: Christian      Lifestyle: no regular exercise; diet good            Social Determinants of Health   Financial Resource Strain: Not on file  Food Insecurity: Not on file  Transportation Needs: Not on file  Physical Activity: Not on file  Stress: Not on file  Social Connections: Unknown (09/08/2022)   Social Connection and Isolation Panel [NHANES]    Frequency of Communication with Friends and Family: Not on file    Frequency of Social Gatherings with Friends and Family: Not on file    Attends Religious Services: 1 to 4 times per year    Active Member of [employer] or Organizations: Not on file    Attends Banker Meetings: Not on file    Marital Status: Not on file  Intimate Partner Violence: Not At Risk (09/08/2022)   Humiliation, Afraid, Rape, and Kick questionnaire    Fear of Current or Ex-Partner: No    Emotionally Abused: No    Physically Abused: No    Sexually Abused: No    Physical Exam: Vital signs in last 24 hours: BP 130/74   Pulse 60   Temp (!) 97.3 F (36.3 C)   Ht 5\' 6"  (1.676 m)   Wt 172 lb (78 kg)   LMP 08/21/2002   SpO2 99%   BMI 27.76 kg/m  GEN: NAD EYE: Sclerae anicteric ENT: MMM CV: Non-tachycardic Pulm: No increased work of breathing GI: Soft, NT/ND NEURO:  Alert & Oriented   Eulah Pont, MD [facility]  04/25/2023 11:16 AM

## 2023-04-25 NOTE — Patient Instructions (Signed)
Impression/Recommendations:  Polyp and hemorrhoid handouts given to patient.  Await pathology results.  YOU HAD AN ENDOSCOPIC PROCEDURE TODAY AT [facility]:   Refer to the procedure report that was given to you for any specific questions about what was found during the examination.  If the procedure report does not answer your questions, please call your gastroenterologist to clarify.  If you requested that your care partner not be given the details of your procedure findings, then the procedure report has been included in a sealed envelope for you to review at your convenience later.  YOU SHOULD EXPECT: Some feelings of bloating in the abdomen. Passage of more gas than usual.  Walking can help get rid of the air that was put into your GI tract during the procedure and reduce the bloating. If you had a lower endoscopy (such as a colonoscopy or flexible sigmoidoscopy) you may notice spotting of blood in your stool or on the toilet paper. If you underwent a bowel prep for your procedure, you may not have a normal bowel movement for a few days.  Please Note:  You might notice some irritation and congestion in your nose or some drainage.  This is from the oxygen used during your procedure.  There is no need for concern and it should clear up in a day or so.  SYMPTOMS TO REPORT IMMEDIATELY:  Following lower endoscopy (colonoscopy or flexible sigmoidoscopy):  Excessive amounts of blood in the stool  Significant tenderness or worsening of abdominal pains  Swelling of the abdomen that is new, acute  Fever of 100F or higher For urgent or emergent issues, a gastroenterologist can be reached at any hour by calling [phone]. Do not use MyChart messaging for urgent concerns.    DIET:  We do recommend a small meal at first, but then you may proceed to your regular diet.  Drink plenty of fluids but you should avoid alcoholic beverages for 24 hours.  ACTIVITY:  You should plan to  take it easy for the rest of today and you should NOT DRIVE or use heavy machinery until tomorrow (because of the sedation medicines used during the test).    FOLLOW UP: Our staff will call the number listed on your records the next business day following your procedure.  We will call around 7:15- 8:00 am to check on you and address any questions or concerns that you may have regarding the information given to you following your procedure. If we do not reach you, we will leave a message.     If any biopsies were taken you will be contacted by phone or by letter within the next 1-3 weeks.  Please call us at [phone] if you have not heard about the biopsies in 3 weeks.    SIGNATURES/CONFIDENTIALITY: You and/or your care partner have signed paperwork which will be entered into your electronic medical record.  These signatures attest to the fact that that the information above on your After Visit Summary has been reviewed and is understood.  Full responsibility of the confidentiality of this discharge information lies with you and/or your care-partner.

## 2023-04-25 NOTE — Progress Notes (Signed)
Sedate, gd SR, tolerated procedure well, VSS, report to RN 

## 2023-04-25 NOTE — Progress Notes (Signed)
Called to room to assist during endoscopic procedure.  Patient ID and intended procedure confirmed with present staff. Received instructions for my participation in the procedure from the performing physician.  

## 2023-04-26 ENCOUNTER — Telehealth: Payer: Self-pay | Admitting: *Deleted

## 2023-04-26 NOTE — Telephone Encounter (Signed)
  Follow up Call-     04/25/2023   10:27 AM  Call back number  Post procedure Call Back phone  # [phone]  Permission to leave phone message Yes     Patient questions:  Do you have a fever, pain , or abdominal swelling? No. Pain Score  0 *  Have you tolerated food without any problems? Yes.    Have you been able to return to your normal activities? Yes.    Do you have any questions about your discharge instructions: Diet   No. Medications  No. Follow up visit  No.  Do you have questions or concerns about your Care? No.  Actions: * If pain score is 4 or above: No action needed, pain <4.

## 2023-04-30 ENCOUNTER — Encounter: Payer: Self-pay | Admitting: Internal Medicine

## 2023-05-13 NOTE — Progress Notes (Incomplete)
Radiation Oncology         ([phone]) [phone] ________________________________  Name: Taylor Buchanan MRN: [id number]  Date: 05/14/2023  DOB: Jul 20, 1956  Follow-Up Visit Note  CC: Willow Ora, MD  Carver Fila, MD  No diagnosis found.  Diagnosis: The encounter diagnosis was Malignant neoplasm of cervix, unspecified site [facility]).   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)  Interval Since Last Radiation: 1 year, 5 months, and 19 days   Intent: Curative  Radiation Treatment Dates: 09/12/2021 through 11/22/2021   IMRT pelvis: 09/12/21 through 10/14/21   Brachytherapy cevix: 10/25/21 through 11/22/21   Site Technique Total Dose (Gy) Dose per Fx (Gy) Completed Fx Beam Energies  Pelvis: Pelvis IMRT 45/45 1.8 25/25 6X  Cervix: Cervix_Bst_Fx1 HDR-brachy 5.5/5.5 5.5 1/1 Ir-192  Cervix: Cervix_Bst_Fx2 HDR-brachy 5.5/5.5 5.5 1/1 Ir-192  Cervix: Cervix_Bst_Fx3 HDR-brachy 5.5/5.5 5.5 1/1 Ir-192  Cervix: Cervix_Bst_Fx4 HDR-brachy 5.5/5.5 5.5 1/1 Ir-192  Cervix: Cervix_Bst_Fx5 HDR-brachy 5.5/5.5 5.5 1/1 Ir-192   Narrative:  The patient returns today for routine follow-up. She was last seen here for follow-up on 10/30/22. Since her last visit, the patient followed up with Dr. Pricilla Holm on 03/09/23. During which time, she reported having some recent watery discharge while traveling, some occasional spotting after intercourse, and minimal discomfort at top left of her vagina after intercourse. (These symptoms resolved by the time of this visit). GU exam performed also noted a small area of tissue necrosis which was thought to be related to her recent episodes of discharge. Dr. Pricilla Holm subsequently started her on vaginal estrogen. She otherwise denied any symptoms concerning for disease recurrence and she was noted as NED on examination.           No other significant oncologic interval history since she was last seen here for follow-up.   ***  Allergies:  has No Known  Allergies.  Meds: Current Outpatient Medications  Medication Sig Dispense Refill   alendronate (FOSAMAX) 70 MG tablet TAKE 1 TABLET EVERY WEEK WITH FULL GLASS OF WATER ON EMPTY STOMACH. SIT UPRIGHT FOR 30 MINUTES 12 tablet 3   aspirin EC 81 MG EC tablet Take 1 tablet (81 mg total) by mouth daily. Swallow whole. 30 tablet 11   atorvastatin (LIPITOR) 40 MG tablet TAKE 1 TABLET BY MOUTH AT  BEDTIME 90 tablet 3   Calcium Carbonate (CALCIUM-CARB 600 PO) Take 1 tablet by mouth daily.     cholecalciferol (VITAMIN D) 1000 UNITS tablet Take 1,000 Units by mouth daily.     escitalopram (LEXAPRO) 10 MG tablet TAKE 1 TABLET BY MOUTH DAILY (Patient not taking: Reported on 04/05/2023) 90 tablet 3   estradiol (ESTRACE VAGINAL) 0.1 MG/GM vaginal cream Place 1 Applicatorful vaginally at bedtime. Apply a small amount (using your finger) inside the vagina twice a week at night 42.5 g 12   pantoprazole (PROTONIX) 20 MG tablet TAKE 1 TABLET BY MOUTH DAILY 90 tablet 3   Current Facility-Administered Medications  Medication Dose Route Frequency Provider Last Rate Last Admin   0.9 %  sodium chloride infusion  500 mL Intravenous Continuous Imogene Burn, MD        Physical Findings: The patient is in no acute distress. Patient is alert and oriented.  vitals were not taken for this visit. .  No significant changes. Lungs are clear to auscultation bilaterally. Heart has regular rate and rhythm. No palpable cervical, supraclavicular, or axillary adenopathy. Abdomen soft, non-tender, normal bowel sounds.  On pelvic examination the external  genitalia were unremarkable. A speculum exam was performed. There are no mucosal lesions noted in the vaginal vault. A Pap smear was obtained of the proximal vagina. On bimanual and rectovaginal examination there were no pelvic masses appreciated. ***   Lab Findings: Lab Results  Component Value Date   WBC 4.5 02/13/2023   HGB 12.9 02/13/2023   HCT 38.6 02/13/2023   MCV 100.9  (H) 02/13/2023   PLT 281.0 02/13/2023    Radiographic Findings: No results found.  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  ***  Plan:  ***   *** minutes of total time was spent for this patient encounter, including preparation, face-to-face counseling with the patient and coordination of care, physical exam, and documentation of the encounter. ____________________________________  Billie Lade, PhD, MD  This document serves as a record of services personally performed by Antony Blackbird, MD. It was created on his behalf by Neena Rhymes, a trained medical scribe. The creation of this record is based on the scribe's personal observations and the provider's statements to them. This document has been checked and approved by the attending provider.

## 2023-05-14 ENCOUNTER — Ambulatory Visit
Admission: RE | Admit: 2023-05-14 | Discharge: 2023-05-14 | Disposition: A | Discharge status: Home / Self Care | Payer: Medicare Other | Source: Ambulatory Visit | Attending: Radiation Oncology | Admitting: Radiation Oncology

## 2023-05-14 ENCOUNTER — Encounter: Payer: Self-pay | Admitting: Radiation Oncology

## 2023-05-14 VITALS — BP 115/69 | HR 69 | Temp 97.3°F | Resp 18 | Ht 66.0 in | Wt 171.0 lb

## 2023-05-14 DIAGNOSIS — Z923 Personal history of irradiation: Secondary | ICD-10-CM | POA: Insufficient documentation

## 2023-05-14 DIAGNOSIS — Z79899 Other long term (current) drug therapy: Secondary | ICD-10-CM | POA: Diagnosis not present

## 2023-05-14 DIAGNOSIS — Z8541 Personal history of malignant neoplasm of cervix uteri: Secondary | ICD-10-CM | POA: Diagnosis present

## 2023-05-14 DIAGNOSIS — Z7982 Long term (current) use of aspirin: Secondary | ICD-10-CM | POA: Insufficient documentation

## 2023-05-14 DIAGNOSIS — C530 Malignant neoplasm of endocervix: Secondary | ICD-10-CM

## 2023-05-14 NOTE — Progress Notes (Signed)
Taylor Buchanan is here today for follow up post radiation to the pelvic.  They completed their radiation on: 11/22/21   Does the patient complain of any of the following:  Pain: No Abdominal bloating: No Diarrhea/Constipation: No Nausea/Vomiting: No Vaginal Discharge: No Blood in Urine or Stool: No Urinary Issues (dysuria/incomplete emptying/ incontinence/ increased frequency/urgency): No Does patient report using vaginal dilator 2-3 times a week and/or sexually active 2-3 weeks:  Yes  Post radiation skin changes: Reports taking estrace cream due to having necrotic tissue inside vagina.   Additional comments if applicable:    BP 115/69 (BP Location: Left Arm, Patient Position: Sitting)   Pulse 69   Temp (!) 97.3 F (36.3 C) (Temporal)   Resp 18   Ht 5\' 6"  (1.676 m)   Wt 171 lb (77.6 kg)   LMP 08/21/2002   SpO2 97%   BMI 27.60 kg/m

## 2023-06-14 ENCOUNTER — Ambulatory Visit (INDEPENDENT_AMBULATORY_CARE_PROVIDER_SITE_OTHER): Payer: Medicare Other

## 2023-06-14 DIAGNOSIS — Z23 Encounter for immunization: Secondary | ICD-10-CM

## 2023-07-05 ENCOUNTER — Ambulatory Visit (INDEPENDENT_AMBULATORY_CARE_PROVIDER_SITE_OTHER): Payer: Medicare Other | Admitting: Family Medicine

## 2023-07-05 VITALS — BP 100/62 | HR 71 | Temp 97.6°F | Ht 66.0 in | Wt 178.2 lb

## 2023-07-05 DIAGNOSIS — M545 Low back pain, unspecified: Secondary | ICD-10-CM

## 2023-07-05 DIAGNOSIS — M7062 Trochanteric bursitis, left hip: Secondary | ICD-10-CM

## 2023-07-05 DIAGNOSIS — M7061 Trochanteric bursitis, right hip: Secondary | ICD-10-CM

## 2023-07-05 DIAGNOSIS — M4125 Other idiopathic scoliosis, thoracolumbar region: Secondary | ICD-10-CM | POA: Diagnosis not present

## 2023-07-05 MED ORDER — CYCLOBENZAPRINE HCL 10 MG PO TABS: 10.0000 mg | ORAL_TABLET | Freq: Every evening | ORAL | 0 refills | Status: DC | PRN

## 2023-07-05 NOTE — Patient Instructions (Addendum)
Please follow up as scheduled for your next visit with me: June for cpe. We will call you to get you scheduled with our physical therapist.   If you have any questions or concerns, please don't hesitate to send me a message via MyChart or call the office at [phone]. Thank you for visiting with [place] today! It's our pleasure caring for you.   Please go to our [facility] office to get your xrays done. You can walk in M-F between 8:30am- noon or 1pm - 5pm. Tell them you are there for xrays ordered by me. They will send me the results, then I will let you know the results with instructions.   Address: [address].  The Xray department is located in the basement.    VISIT SUMMARY:  During today's visit, we discussed your persistent lower back pain that began four weeks ago after attending a balloon festival and doing some mulching. We reviewed your history of scoliosis and how it may be contributing to your current discomfort. We also talked about your current pain management strategies and explored additional treatment options.  YOUR PLAN:  -LOW BACK PAIN: Low back pain can be caused by various factors, including muscle strain, arthritis, or bursitis. Your pain started after sitting on the ground and doing some mulching, and while it has improved, it still causes stiffness and limited mobility. We will start physical therapy to help improve your mobility and prescribe a muscle relaxer for nighttime use. You should reduce your ibuprofen intake, and if your symptoms persist or worsen, we may consider getting x-rays.  -SCOLIOSIS: Scoliosis is a condition where the spine curves sideways, which can contribute to back pain and postural issues. We discussed the potential benefits of imaging to assess the current status of your scoliosis. If your symptoms persist or worsen, we may consider getting imaging done.  -GENERAL HEALTH MAINTENANCE: You are up to date with your flu shots and other  vaccinations. Continue with your routine health maintenance.  INSTRUCTIONS:  Please follow up with physical therapy as discussed. If your symptoms persist or worsen, we may consider getting x-rays. If you have any additional issues, please send [place] a message.

## 2023-07-05 NOTE — Progress Notes (Signed)
Subjective  CC:  Chief Complaint  Patient presents with   Back Pain    Pt would like to discuss a referral for back pain. Stated that her pain came back about 4 weeks ago. It is not getting worse it is just painful.    HPI: Taylor Buchanan is a 67 y.o. female who presents to the office today to address the problems listed above in the chief complaint. Discussed the use of AI scribe software for clinical note transcription with the patient, who gave verbal consent to proceed.  History of Present Illness   The patient, a 67 year old with a history of scoliosis, presented with a complaint of persistent lower back pain that began approximately four weeks ago following a balloon festival. The patient reported sitting on the ground during the event and started experiencing pain a few days later after doing some mulching. The pain was described as excruciating initially, affecting both sides of the lower back and was exacerbated by movements such as sitting, lying down, squatting, and bending. The pain did not radiate down the legs and there were no associated bowel or bladder problems.  The patient managed the pain with ibuprofen, approximately 1200mg  daily, and heating pads, which provided temporary relief. The patient also attempted some online stretches for relief but discontinued due to increased pain. Over the course of four weeks, the pain has gradually improved but persists at a milder level, causing stiffness and limited mobility, particularly by the end of the day. The patient reported no spasms or catching pain, and sleep has been relatively unaffected.  The patient has a history of back issues due to scoliosis and has noticed a general tightness in the upper back. The patient also reported moving some wood during the camping trip but denied any falls or heavy lifting that could have contributed to the current back pain. The patient expressed a desire to reduce the daily intake of ibuprofen and  seek additional treatment options to manage the pain.      Assessment  1. Acute bilateral low back pain without sciatica   2. Other idiopathic scoliosis, thoracolumbar region      Plan  Assessment and Plan    Low Back Pain Chronic low back pain exacerbated by recent activities such as sitting on the ground, camping, and mulching. Pain initially excruciating, now mild but persistent with end-of-day stiffness. No radicular symptoms or bowel/bladder issues. Likely musculoskeletal, possibly involving arthritis or bursitis. Scoliosis and weight gain may contribute. Discussed physical therapy, muscle relaxers, and imaging. Patient prefers physical therapy and muscle relaxers, with imaging if symptoms persist or worsen. - Order physical therapy - Prescribe muscle relaxer for nighttime use - Recommend reducing ibuprofen use - order x-rays to visualize lumbar spine  Hip bursitis -ice bilaterally and physical therapy.  -consider steroid injections if not improving  Scoliosis Chronic condition contributing to back pain and postural issues. No recent imaging available. Discussed potential benefits of imaging to assess current status. - Consider imaging if symptoms persist or worsen  General Health Maintenance Up to date with flu shots and other vaccinations. - Continue routine health maintenance  Follow-up - Follow up with physical therapy      Follow up: [facility] in june 09/13/2023  Orders Placed This Encounter  Procedures   DG Lumbar Spine Complete   Ambulatory referral to Physical Therapy   Meds ordered this encounter  Medications   cyclobenzaprine (FLEXERIL) 10 MG tablet    Sig: Take 1 tablet (10 mg  total) by mouth at bedtime as needed for muscle spasms.    Dispense:  30 tablet    Refill:  0      I reviewed the patients updated PMH, FH, and SocHx.    Patient Active Problem List   Diagnosis Date Noted   Malignant neoplasm of cervix (HCC) 08/16/2021    Priority: High   Panic  disorder 05/31/2021    Priority: High   Mixed hyperlipidemia 02/14/2021    Priority: High   TIA (transient ischemic attack) 01/29/2021    Priority: High   Osteoporosis 09/02/2013    Priority: High   Gastroesophageal reflux disease without esophagitis 02/08/2022    Priority: Low   Vitamin B12 deficiency 02/08/2022    Priority: Low   Deficiency anemia 11/03/2021    Priority: Low   Scoliosis 04/18/2019    Priority: Low   Leukopenia due to antineoplastic chemotherapy (HCC) 10/10/2021   Current Meds  Medication Sig   alendronate (FOSAMAX) 70 MG tablet TAKE 1 TABLET EVERY WEEK WITH FULL GLASS OF WATER ON EMPTY STOMACH. SIT UPRIGHT FOR 30 MINUTES   aspirin EC 81 MG EC tablet Take 1 tablet (81 mg total) by mouth daily. Swallow whole.   atorvastatin (LIPITOR) 40 MG tablet TAKE 1 TABLET BY MOUTH AT  BEDTIME   Calcium Carbonate (CALCIUM-CARB 600 PO) Take 1 tablet by mouth daily.   cholecalciferol (VITAMIN D) 1000 UNITS tablet Take 1,000 Units by mouth daily.   cyclobenzaprine (FLEXERIL) 10 MG tablet Take 1 tablet (10 mg total) by mouth at bedtime as needed for muscle spasms.   escitalopram (LEXAPRO) 10 MG tablet TAKE 1 TABLET BY MOUTH DAILY   estradiol (ESTRACE VAGINAL) 0.1 MG/GM vaginal cream Place 1 Applicatorful vaginally at bedtime. Apply a small amount (using your finger) inside the vagina twice a week at night   pantoprazole (PROTONIX) 20 MG tablet TAKE 1 TABLET BY MOUTH DAILY    Allergies: Patient has No Known Allergies. Family History: Patient family history includes Alzheimer's disease in her mother; Bladder Cancer in her father; CAD in her maternal grandfather and maternal grandmother; Dementia in her mother; Healthy in her brother, brother, brother, son, and son; Mental illness in her mother; Stroke in her mother. Social History:  Patient  reports that she quit smoking about 27 years ago. Her smoking use included cigarettes. She started smoking about 42 years ago. She has a 15  pack-year smoking history. She has never used smokeless tobacco. She reports current alcohol use of about 3.0 standard drinks of alcohol per week. She reports current drug use. Drug: Marijuana.  Review of Systems: Constitutional: Negative for fever malaise or anorexia Cardiovascular: negative for chest pain Respiratory: negative for SOB or persistent cough Gastrointestinal: negative for abdominal pain  Objective  Vitals: BP 100/62   Pulse 71   Temp 97.6 F (36.4 C)   Ht 5\' 6"  (1.676 m)   Wt 178 lb 3.2 oz (80.8 kg)   LMP 08/21/2002   SpO2 95%   BMI 28.76 kg/m  General: no acute distress , A&Ox3 Physical Exam   MUSCULOSKELETAL: Scoliosis observed. Tenderness in lower back and bilateral hips. Able to touch floor with forward bend, reports soreness. Difficulty with single-leg stance and knee-to-chest due to pain. NEUROLOGICAL: No nerve pain or tingling in legs.  + 2 DTRS      Commons side effects, risks, benefits, and alternatives for medications and treatment plan prescribed today were discussed, and the patient expressed understanding of the given instructions. Patient is  instructed to call or message via MyChart if he/she has any questions or concerns regarding our treatment plan. No barriers to understanding were identified. We discussed Red Flag symptoms and signs in detail. Patient expressed understanding regarding what to do in case of urgent or emergency type symptoms.  Medication list was reconciled, printed and provided to the patient in AVS. Patient instructions and summary information was reviewed with the patient as documented in the AVS. This note was prepared with assistance of Dragon voice recognition software. Occasional wrong-word or sound-a-like substitutions may have occurred due to the inherent limitations of voice recognition software

## 2023-07-09 ENCOUNTER — Ambulatory Visit: Payer: Medicare Other | Admitting: Family Medicine

## 2023-07-11 ENCOUNTER — Ambulatory Visit (INDEPENDENT_AMBULATORY_CARE_PROVIDER_SITE_OTHER)
Admission: RE | Admit: 2023-07-11 | Discharge: 2023-07-11 | Disposition: A | Discharge status: Home / Self Care | Payer: Medicare Other | Source: Ambulatory Visit | Attending: Family Medicine | Admitting: Family Medicine

## 2023-07-11 DIAGNOSIS — M545 Low back pain, unspecified: Secondary | ICD-10-CM

## 2023-07-11 DIAGNOSIS — M4125 Other idiopathic scoliosis, thoracolumbar region: Secondary | ICD-10-CM

## 2023-07-23 ENCOUNTER — Telehealth: Payer: Self-pay | Admitting: Family Medicine

## 2023-07-23 ENCOUNTER — Other Ambulatory Visit: Payer: Self-pay

## 2023-07-23 DIAGNOSIS — M545 Low back pain, unspecified: Secondary | ICD-10-CM

## 2023-07-23 NOTE — Telephone Encounter (Signed)
Pt states she was supposed to get a referral to PT from Dr Mardelle Matte and I do not see it in chart. Please advise.

## 2023-07-31 ENCOUNTER — Encounter: Payer: Self-pay | Admitting: Family Medicine

## 2023-07-31 DIAGNOSIS — M47816 Spondylosis without myelopathy or radiculopathy, lumbar region: Secondary | ICD-10-CM | POA: Insufficient documentation

## 2023-07-31 NOTE — Progress Notes (Signed)
See mychart note Dear Ms. Sproull, Your xray confirms multilevel arthritic changes and osteoporosis.  I'm hopeful physical therapy will be helpful.  Sincerely, Dr. Mardelle Matte

## 2023-08-07 ENCOUNTER — Other Ambulatory Visit: Payer: Self-pay | Admitting: Family Medicine

## 2023-08-13 ENCOUNTER — Ambulatory Visit: Payer: Medicare Other | Admitting: Physical Therapy

## 2023-08-24 ENCOUNTER — Encounter (HOSPITAL_COMMUNITY): Payer: Self-pay

## 2023-08-24 ENCOUNTER — Ambulatory Visit (INDEPENDENT_AMBULATORY_CARE_PROVIDER_SITE_OTHER): Payer: Medicare Other

## 2023-08-24 ENCOUNTER — Ambulatory Visit (HOSPITAL_COMMUNITY)
Admission: EM | Admit: 2023-08-24 | Discharge: 2023-08-24 | Disposition: A | Discharge status: Home / Self Care | Payer: Medicare Other | Attending: Emergency Medicine | Admitting: Emergency Medicine

## 2023-08-24 DIAGNOSIS — R051 Acute cough: Secondary | ICD-10-CM

## 2023-08-24 DIAGNOSIS — J069 Acute upper respiratory infection, unspecified: Secondary | ICD-10-CM

## 2023-08-24 MED ORDER — BENZONATATE 200 MG PO CAPS: 200.0000 mg | ORAL_CAPSULE | Freq: Three times a day (TID) | ORAL | 0 refills | Status: DC

## 2023-08-24 MED ORDER — PREDNISONE 20 MG PO TABS: 40.0000 mg | ORAL_TABLET | Freq: Every day | ORAL | 0 refills | Status: AC

## 2023-08-24 MED ORDER — AZITHROMYCIN 250 MG PO TABS: 250.0000 mg | ORAL_TABLET | Freq: Every day | ORAL | 0 refills | Status: DC

## 2023-08-24 NOTE — Discharge Instructions (Signed)
 I did not see any obvious pneumonia on your imaging, I am covering you with antibiotics due to your prolonged symptoms.  The steroids will help with your wheezing, take these daily with breakfast.  You can use the cough medicine every 8 hours as needed.  Consider sleeping with a humidifier.  1200 mg of Mucinex daily can help loosen up your secretions as well.  Your symptoms should improve with these medications, if no improvement or any changes follow-up with your primary care provider or return to clinic.

## 2023-08-24 NOTE — ED Provider Notes (Signed)
 [facility]    CSN: [id number] Arrival date & time: 08/24/23  0801      History   Chief Complaint Chief Complaint  Patient presents with   Cough    HPI Taylor Buchanan is a 68 y.o. female.   Patient presents to clinic for complaints of a productive cough with clear phlegm, wheezing, mild headache, nasal congestion, nasal drainage, excessive fatigue and shortness of breath with exertion like going up the steps for the past week.  Symptoms have been present since Christmas Day.  Has been taking Robitussin DM, feels like this just loosens up her secretions.  Has been and children that came over for Christmas were also sick with similar symptoms.  Patient has not had any fevers.  No vomiting or diarrhea.  No history of any asthma, COPD or respiratory issues.  The history is provided by the patient and medical records.  Cough   Past Medical History:  Diagnosis Date   Abnormal Pap smear of cervix    Arthritis    Cervical cancer (HCC)    Depression    GAD (generalized anxiety disorder)    GERD (gastroesophageal reflux disease)    History of migraine    History of radiation therapy    cervix - pelvis  09/12/2021-10/14/2021 Dr Lynwood Nasuti   History of radiation therapy    cervix - HDR brachy tandem and ring 10/25/2021-11/22/2021  Dr Lynwood Nasuti   History of transient ischemic attack (TIA) 01/2021   neruologist--- dr rosemarie;  ad[facility]/ 2022 in [facility] ,  per note complicated migraine vs anxiety,  less likely TIA   HLD (hyperlipidemia)    Osteoporosis    Pain disorder    Scoliosis    Stroke (HCC) 2022   TIA   Torn ACL (anterior cruciate ligament) left hand 11/05/2021   wearing splint seeing ortho md week of 11-14-2021    Patient Active Problem List   Diagnosis Date Noted   Degenerative joint disease (DJD) of lumbar spine 07/31/2023   Gastroesophageal reflux disease without esophagitis 02/08/2022   Vitamin B12 deficiency 02/08/2022   Deficiency anemia 11/03/2021    Leukopenia due to antineoplastic chemotherapy (HCC) 10/10/2021   Malignant neoplasm of cervix (HCC) 08/16/2021   Panic disorder 05/31/2021   Mixed hyperlipidemia 02/14/2021   TIA (transient ischemic attack) 01/29/2021   Scoliosis 04/18/2019   Osteoporosis 09/02/2013    Past Surgical History:  Procedure Laterality Date   CERVICAL CONIZATION W/BX N/A 08/04/2021   Procedure: CONIZATION CERVIX WITH BIOPSY: DILATION AND CURETTAGE OF UTERUS;  Surgeon: Delana Ted Morrison, DO;  Location: [facility];  Service: Gynecology;  Laterality: N/A;   CHOLECYSTECTOMY OPEN  2005   approx   IR IMAGING GUIDED PORT INSERTION  09/08/2021   IR REMOVAL TUN ACCESS W/ PORT W/O FL MOD SED  03/03/2022   OPERATIVE ULTRASOUND N/A 10/25/2021   Procedure: OPERATIVE ULTRASOUND;  Surgeon: Nasuti Lynwood, MD;  Location: [facility];  Service: Urology;  Laterality: N/A;   OPERATIVE ULTRASOUND N/A 11/03/2021   Procedure: OPERATIVE ULTRASOUND;  Surgeon: Nasuti Lynwood, MD;  Location: [facility];  Service: Urology;  Laterality: N/A;   OPERATIVE ULTRASOUND N/A 11/10/2021   Procedure: OPERATIVE ULTRASOUND;  Surgeon: Nasuti Lynwood, MD;  Location[facility];  Service: Urology;  Laterality: N/A;   OPERATIVE ULTRASOUND N/A 11/18/2021   Procedure: OPERATIVE ULTRASOUND;  Surgeon: Nasuti Lynwood, MD;  Location: [facility];  Service: Urology;  Laterality: N/A;  OPERATIVE ULTRASOUND N/A 11/22/2021   Procedure: OPERATIVE ULTRASOUND;  Surgeon: Shannon Agent, MD;  Location: [facility];  Service: Urology;  Laterality: N/A;   TANDEM RING INSERTION N/A 10/25/2021   Procedure: TANDEM RING INSERTION;  Surgeon: Shannon Agent, MD;  Location: [facility];  Service: Urology;  Laterality: N/A;   TANDEM RING INSERTION N/A 11/03/2021   Procedure: TANDEM RING INSERTION;  Surgeon: Shannon Agent, MD;  Location: [facility];  Service: Urology;   Laterality: N/A;   TANDEM RING INSERTION N/A 11/10/2021   Procedure: TANDEM RING INSERTION;  Surgeon: Shannon Agent, MD;  Location: [facility];  Service: Urology;  Laterality: N/A;   TANDEM RING INSERTION N/A 11/18/2021   Procedure: TANDEM RING INSERTION;  Surgeon: Shannon Agent, MD;  Location: [facility];  Service: Urology;  Laterality: N/A;   TANDEM RING INSERTION N/A 11/22/2021   Procedure: TANDEM RING INSERTION;  Surgeon: Shannon Agent, MD;  Location: [facility];  Service: Urology;  Laterality: N/A;   TENDON REPAIR Left 2008   Ankle/ foot    OB History     Gravida  2   Para  2   Term      Preterm      AB      Living         SAB      IAB      Ectopic      Multiple      Live Births               Home Medications    Prior to Admission medications   Medication Sig Start Date End Date Taking? Authorizing Provider  azithromycin  (ZITHROMAX ) 250 MG tablet Take 1 tablet (250 mg total) by mouth daily. Take first 2 tablets together, then 1 every day until finished. 08/24/23  Yes Lucile Hillmann  N, FNP  benzonatate  (TESSALON ) 200 MG capsule Take 1 capsule (200 mg total) by mouth every 8 (eight) hours. 08/24/23  Yes Dalen Hennessee  N, FNP  predniSONE  (DELTASONE ) 20 MG tablet Take 2 tablets (40 mg total) by mouth daily for 5 days. 08/24/23 08/29/23 Yes Esmond Hinch  N, FNP  alendronate  (FOSAMAX ) 70 MG tablet TAKE 1 TABLET EVERY WEEK WITH FULL GLASS OF WATER ON EMPTY STOMACH. SIT UPRIGHT FOR 30 MINUTES 01/24/23   Jodie Lavern CROME, MD  aspirin  EC 81 MG EC tablet Take 1 tablet (81 mg total) by mouth daily. Swallow whole. 02/01/21   Wouk, Devaughn Sayres, MD  atorvastatin  (LIPITOR) 40 MG tablet TAKE 1 TABLET BY MOUTH AT  BEDTIME 08/23/22   Jodie Lavern CROME, MD  Calcium  Carbonate (CALCIUM -CARB 600 PO) Take 1 tablet by mouth daily.    [provider]  cholecalciferol (VITAMIN D ) 1000 UNITS tablet Take 1,000 Units by mouth daily.     [provider]  escitalopram  (LEXAPRO ) 10 MG tablet TAKE 1 TABLET BY MOUTH DAILY Patient not taking: Reported on 08/24/2023 08/23/22   Jodie Lavern CROME, MD  estradiol  (ESTRACE  VAGINAL) 0.1 MG/GM vaginal cream Place 1 Applicatorful vaginally at bedtime. Apply a small amount (using your finger) inside the vagina twice a week at night 03/09/23   Viktoria Comer SAUNDERS, MD  pantoprazole  (PROTONIX ) 20 MG tablet TAKE 1 TABLET BY MOUTH DAILY 08/23/22   Jodie Lavern CROME, MD    Family History Family History  Problem Relation Age of Onset   Mental illness Mother    Stroke Mother    Dementia Mother  Alzheimer's disease Mother    Bladder Cancer Father    Healthy Brother    Healthy Brother    Healthy Brother    CAD Maternal Grandmother    CAD Maternal Grandfather    Healthy Son    Healthy Son    Colon cancer Neg Hx    Breast cancer Neg Hx    Ovarian cancer Neg Hx    Endometrial cancer Neg Hx    Pancreatic cancer Neg Hx    Prostate cancer Neg Hx    Colon polyps Neg Hx    Esophageal cancer Neg Hx    Stomach cancer Neg Hx    Rectal cancer Neg Hx     Social History Social History   Tobacco Use   Smoking status: Former    Current packs/day: 0.00    Average packs/day: 1 pack/day for 15.0 years (15.0 ttl pk-yrs)    Types: Cigarettes    Start date: 7    Quit date: 1997    Years since quitting: 28.0   Smokeless tobacco: Never  Vaping Use   Vaping status: Never Used  Substance Use Topics   Alcohol use: Yes    Alcohol/week: 3.0 standard drinks of alcohol    Types: 3 Cans of beer per week    Comment: social   Drug use: Yes    Types: Marijuana    Comment: 1-2 times over past year     Allergies   Patient has no known allergies.   Review of Systems Review of Systems  Per HPI  Physical Exam Triage Vital Signs ED Triage Vitals  Encounter Vitals Group     BP 08/24/23 0817 122/83     Systolic BP Percentile --      Diastolic BP Percentile --      Pulse Rate 08/24/23 0817  66     Resp 08/24/23 0817 16     Temp 08/24/23 0817 98.3 F (36.8 C)     Temp Source 08/24/23 0817 Oral     SpO2 08/24/23 0817 97 %     Weight 08/24/23 0817 174 lb (78.9 kg)     Height 08/24/23 0817 5' 6 (1.676 m)     Head Circumference --      Peak Flow --      Pain Score 08/24/23 0815 0     Pain Loc --      Pain Education --      Exclude from Growth Chart --    No data found.  Updated Vital Signs BP 122/83 (BP Location: Left Arm)   Pulse 66   Temp 98.3 F (36.8 C) (Oral)   Resp 16   Ht 5' 6 (1.676 m)   Wt 174 lb (78.9 kg)   LMP 08/21/2002   SpO2 97%   BMI 28.08 kg/m   Visual Acuity Right Eye Distance:   Left Eye Distance:   Bilateral Distance:    Right Eye Near:   Left Eye Near:    Bilateral Near:     Physical Exam Vitals and nursing note reviewed.  Constitutional:      Appearance: Normal appearance.  HENT:     Head: Normocephalic and atraumatic.     Right Ear: External ear normal.     Left Ear: External ear normal.     Nose: Nose normal.     Mouth/Throat:     Mouth: Mucous membranes are moist.  Eyes:     Conjunctiva/sclera: Conjunctivae normal.  Cardiovascular:     Rate  and Rhythm: Normal rate and regular rhythm.     Heart sounds: Normal heart sounds. No murmur heard. Pulmonary:     Effort: Pulmonary effort is normal.     Breath sounds: Examination of the left-middle field reveals decreased breath sounds. Examination of the right-lower field reveals wheezing. Examination of the left-lower field reveals decreased breath sounds. Decreased breath sounds and wheezing present.     Comments: Diminished air movement in left lower lobe.  Mild expiratory wheezing on right lower lobe. Musculoskeletal:        General: Normal range of motion.  Skin:    General: Skin is warm and dry.  Neurological:     General: No focal deficit present.     Mental Status: She is alert.  Psychiatric:        Behavior: Behavior is cooperative.      UC Treatments / Results   Labs (all labs ordered are listed, but only abnormal results are displayed) Labs Reviewed - No data to display  EKG   Radiology No results found.  Procedures Procedures (including critical care time)  Medications Ordered in UC Medications - No data to display  Initial Impression / Assessment and Plan / UC Course  I have reviewed the triage vital signs and the nursing notes.  Pertinent labs & imaging results that were available during my care of the patient were reviewed by me and considered in my medical decision making (see chart for details).  Vitals and triage reviewed, patient is hemodynamically stable.  Lungs are somewhat diminished on the left side and mild expiratory wheezing in right lower lobe.  Heart with regular rate and rhythm.  Imaging does not reveal any obvious pneumonia, will cover for azithromycin  due to prolonged illness.  Steroids sent in for wheezing and shortness of breath.  Tessalon  for cough.  Plan of care, follow-up care return precautions given, no questions at this time.     Final Clinical Impressions(s) / UC Diagnoses   Final diagnoses:  Acute cough  Upper respiratory tract infection, unspecified type     Discharge Instructions      I did not see any obvious pneumonia on your imaging, I am covering you with antibiotics due to your prolonged symptoms.  The steroids will help with your wheezing, take these daily with breakfast.  You can use the cough medicine every 8 hours as needed.  Consider sleeping with a humidifier.  1200 mg of Mucinex daily can help loosen up your secretions as well.  Your symptoms should improve with these medications, if no improvement or any changes follow-up with your primary care provider or return to clinic.      ED Prescriptions     Medication Sig Dispense Auth. Provider   predniSONE  (DELTASONE ) 20 MG tablet Take 2 tablets (40 mg total) by mouth daily for 5 days. 10 tablet Dreama, Chanler Schreiter  N, FNP   azithromycin   (ZITHROMAX ) 250 MG tablet Take 1 tablet (250 mg total) by mouth daily. Take first 2 tablets together, then 1 every day until finished. 6 tablet Dreama, Ahron Hulbert  N, FNP   benzonatate  (TESSALON ) 200 MG capsule Take 1 capsule (200 mg total) by mouth every 8 (eight) hours. 30 capsule Dreama, Hillari Zumwalt  N, FNP      PDMP not reviewed this encounter.   Dreama, Phaedra Colgate  N, FNP 08/24/23 [phone]

## 2023-08-24 NOTE — ED Triage Notes (Signed)
 Patient here today with c/o productive cough, wheeze, headache, nasal drainage, and fatigue since Christmas day. She has been taking Robitussin DM with some relief. Her husband and kids that came over fore Christmas were also sick.

## 2023-09-07 ENCOUNTER — Encounter: Payer: Self-pay | Admitting: Gynecologic Oncology

## 2023-09-07 ENCOUNTER — Inpatient Hospital Stay: Payer: Medicare Other | Attending: Gynecologic Oncology | Admitting: Gynecologic Oncology

## 2023-09-07 VITALS — BP 128/84 | HR 71 | Temp 99.1°F | Resp 20 | Wt 177.4 lb

## 2023-09-07 DIAGNOSIS — N952 Postmenopausal atrophic vaginitis: Secondary | ICD-10-CM | POA: Diagnosis not present

## 2023-09-07 DIAGNOSIS — C539 Malignant neoplasm of cervix uteri, unspecified: Secondary | ICD-10-CM

## 2023-09-07 DIAGNOSIS — Z8541 Personal history of malignant neoplasm of cervix uteri: Secondary | ICD-10-CM | POA: Diagnosis not present

## 2023-09-07 DIAGNOSIS — Z923 Personal history of irradiation: Secondary | ICD-10-CM | POA: Diagnosis not present

## 2023-09-07 DIAGNOSIS — Z79890 Hormone replacement therapy (postmenopausal): Secondary | ICD-10-CM | POA: Diagnosis not present

## 2023-09-07 DIAGNOSIS — Z9221 Personal history of antineoplastic chemotherapy: Secondary | ICD-10-CM | POA: Insufficient documentation

## 2023-09-07 NOTE — Patient Instructions (Signed)
It was good to see you today.  I do not see or feel any evidence of cancer recurrence on your exam.  I will see you for follow-up in 6 months.  As always, if you develop any new and concerning symptoms before your next visit, please call to see me sooner.   

## 2023-09-07 NOTE — Progress Notes (Signed)
Gynecologic Oncology Return Clinic Visit  09/07/23  Reason for Visit: surveillance  Treatment History: Oncology History  Malignant neoplasm of cervix (HCC)  07/05/2021 Pathology Results   A. ENDOMETRIAL CURETTINGS:  Blood clots containing numerous strips of squamous epithelium with features of high-grade SIL(CIN-3/severe dysplasia).  Normal endocervical tissue or endometrial tissue are not identified.   B. CERVIX, CONE BIOPSY:  Invasive squamous cell carcinoma, focally keratinizing and moderately differentiated with circumferential involvement.  All margins including the deep margin are positive for squamous cell carcinoma.  Please see comment. [id number] positive   08/16/2021 Initial Diagnosis   Malignant neoplasm of cervix (HCC)   08/30/2021 PET scan   Focal hypermetabolism in the region of the cervix, consistent with known primary cervical carcinoma.   No evidence of pelvic lymph node or distant metastatic disease.     09/01/2021 Cancer Staging   Staging form: Cervix Uteri, AJCC Version 9 - Clinical stage from 09/01/2021: FIGO Stage IB1 (cT1b1, cN0, cM0) - Signed by Artis Delay, MD on 09/01/2021 Stage prefix: Initial diagnosis   09/08/2021 Procedure   Successful placement of a right internal jugular approach power injectable Port-A-Cath. The catheter is ready for immediate use.     09/12/2021 - 11/22/2021 Radiation Therapy   IMRT pelvis: 09/12/21 through 10/14/21  Brachytherapy cevix: 10/25/21 through 11/22/21   Site Technique Total Dose (Gy) Dose per Fx (Gy) Completed Fx Beam Energies  Pelvis: Pelvis IMRT 45/45 1.8 25/25 6X  Cervix: Cervix_Bst_Fx1 HDR-brachy 5.5/5.5 5.5 1/1 Ir-192  Cervix: Cervix_Bst_Fx2 HDR-brachy 5.5/5.5 5.5 1/1 Ir-192  Cervix: Cervix_Bst_Fx3 HDR-brachy 5.5/5.5 5.5 1/1 Ir-192  Cervix: Cervix_Bst_Fx4 HDR-brachy 5.5/5.5 5.5 1/1 Ir-192  Cervix: Cervix_Bst_Fx5 HDR-brachy 5.5/5.5 5.5 1/1 Ir-192       09/13/2021 - 10/11/2021 Chemotherapy   Patient is on Treatment  Plan : cervical Cisplatin q7d      02/23/2022 PET scan   Marked interval response to therapy with reduction of metabolic activity in the area of the cervix. Residual activity only minimally greater than mediastinal blood pool, attention on follow-up.   No signs of metastatic disease.   03/03/2022 Procedure   Removal of implanted Port-A-Cath utilizing sharp and blunt dissection. The procedure was uncomplicated.     Interval History: Doing well.  Denies any vaginal bleeding or discharge.  Continues to use vaginal estrogen.  Finished pelvic floor physical therapy, continues to do exercises, which has helped with her urinary symptoms.  Has been using dilator or having intercourse regularly.  Has some baseline constipation intermittently, unchanged.  Recovering from respiratory infection over the holidays, finally feels like she is on the mend.  Past Medical/Surgical History: Past Medical History:  Diagnosis Date   Abnormal Pap smear of cervix    Arthritis    Cervical cancer (HCC)    Depression    GAD (generalized anxiety disorder)    GERD (gastroesophageal reflux disease)    History of migraine    History of radiation therapy    cervix - pelvis  09/12/2021-10/14/2021 Dr Antony Blackbird   History of radiation therapy    cervix - HDR brachy tandem and ring 10/25/2021-11/22/2021  Dr Antony Blackbird   History of transient ischemic attack (TIA) 01/2021   neruologist--- dr Pearlean Brownie;  [facility]/ [facility] in [facility] ,  per note complicated migraine vs anxiety,  less likely TIA   HLD (hyperlipidemia)    Osteoporosis    Pain disorder    Scoliosis    Stroke (HCC) 2022   TIA   Torn ACL (anterior cruciate ligament) left  hand 11/05/2021   wearing splint seeing ortho md week of 11-14-2021    Past Surgical History:  Procedure Laterality Date   CERVICAL CONIZATION W/BX N/A 08/04/2021   Procedure: CONIZATION CERVIX WITH BIOPSY: DILATION AND CURETTAGE OF UTERUS;  Surgeon: Edwinna Areola, DO;  Location:  [facility];  Service: Gynecology;  Laterality: N/A;   CHOLECYSTECTOMY OPEN  2005   approx   IR IMAGING GUIDED PORT INSERTION  09/08/2021   IR REMOVAL TUN ACCESS W/ PORT W/O FL MOD SED  03/03/2022   OPERATIVE ULTRASOUND N/A 10/25/2021   Procedure: OPERATIVE ULTRASOUND;  Surgeon: Antony Blackbird, MD;  Location: [facility];  Service: Urology;  Laterality: N/A;   OPERATIVE ULTRASOUND N/A 11/03/2021   Procedure: OPERATIVE ULTRASOUND;  Surgeon: Antony Blackbird, MD;  Location: [facility];  Service: Urology;  Laterality: N/A;   OPERATIVE ULTRASOUND N/A 11/10/2021   Procedure: OPERATIVE ULTRASOUND;  Surgeon: Antony Blackbird, MD;  Location: [facility];  Service: Urology;  Laterality: N/A;   OPERATIVE ULTRASOUND N/A 11/18/2021   Procedure: OPERATIVE ULTRASOUND;  Surgeon: Antony Blackbird, MD;  Location: [facility];  Service: Urology;  Laterality: N/A;   OPERATIVE ULTRASOUND N/A 11/22/2021   Procedure: OPERATIVE ULTRASOUND;  Surgeon: Antony Blackbird, MD;  Location: [facility];  Service: Urology;  Laterality: N/A;   TANDEM RING INSERTION N/A 10/25/2021   Procedure: TANDEM RING INSERTION;  Surgeon: Antony Blackbird, MD;  Location: [facility];  Service: Urology;  Laterality: N/A;   TANDEM RING INSERTION N/A 11/03/2021   Procedure: TANDEM RING INSERTION;  Surgeon: Antony Blackbird, MD;  Location: [facility];  Service: Urology;  Laterality: N/A;   TANDEM RING INSERTION N/A 11/10/2021   Procedure: TANDEM RING INSERTION;  Surgeon: Antony Blackbird, MD;  Location: [facility];  Service: Urology;  Laterality: N/A;   TANDEM RING INSERTION N/A 11/18/2021   Procedure: TANDEM RING INSERTION;  Surgeon: Antony Blackbird, MD;  Location: [facility];  Service: Urology;  Laterality: N/A;   TANDEM RING INSERTION N/A 11/22/2021   Procedure: TANDEM RING INSERTION;  Surgeon: Antony Blackbird, MD;  Location:  [facility];  Service: Urology;  Laterality: N/A;   TENDON REPAIR Left 2008   Ankle/ foot    Family History  Problem Relation Age of Onset   Mental illness Mother    Stroke Mother    Dementia Mother    Alzheimer's disease Mother    Bladder Cancer Father    Healthy Brother    Healthy Brother    Healthy Brother    CAD Maternal Grandmother    CAD Maternal Grandfather    Healthy Son    Healthy Son    Colon cancer Neg Hx    Breast cancer Neg Hx    Ovarian cancer Neg Hx    Endometrial cancer Neg Hx    Pancreatic cancer Neg Hx    Prostate cancer Neg Hx    Colon polyps Neg Hx    Esophageal cancer Neg Hx    Stomach cancer Neg Hx    Rectal cancer Neg Hx     Social History   Socioeconomic History   Marital status: Married    Spouse name: Not on file   Number of children: 2   Years of education: Not on file   Highest education level: Bachelor's degree (e.g., BA, AB, BS)  Occupational History    Employer: [employer]    Comment:  retired   Occupation: retired  Tobacco Use   Smoking status: Former    Current packs/day: 0.00    Average packs/day: 1 pack/day for 15.0 years (15.0 ttl pk-yrs)    Types: Cigarettes    Start date: 73    Quit date: 1997    Years since quitting: 28.0   Smokeless tobacco: Never  Vaping Use   Vaping status: Never Used  Substance and Sexual Activity   Alcohol use: Yes    Alcohol/week: 3.0 standard drinks of alcohol    Types: 3 Cans of beer per week    Comment: social   Drug use: Yes    Types: Marijuana    Comment: 1-2 times over past year   Sexual activity: Yes    Birth control/protection: Post-menopausal  Other Topics Concern   Not on file  Social History Narrative   Work or School: [employer]      Home Situation: lives with husband      Spiritual Beliefs: Christian      Lifestyle: no regular exercise; diet good            Social Drivers of Corporate investment banker Strain: Low Risk   (07/05/2023)   Overall Financial Resource Strain (CARDIA)    Difficulty of Paying Living Expenses: Not hard at all  Food Insecurity: No Food Insecurity (07/05/2023)   Hunger Vital Sign    Worried About Running Out of Food in the Last Year: Never true    Ran Out of Food in the Last Year: Never true  Transportation Needs: No Transportation Needs (07/05/2023)   PRAPARE - Administrator, Civil Service (Medical): No    Lack of Transportation (Non-Medical): No  Physical Activity: Insufficiently Active (07/05/2023)   Exercise Vital Sign    Days of Exercise per Week: 1 day    Minutes of Exercise per Session: 20 min  Stress: No Stress Concern Present (07/05/2023)   [employer] of Occupational Health - Occupational Stress Questionnaire    Feeling of Stress : Not at all  Social Connections: Moderately Integrated (07/05/2023)   Social Connection and Isolation Panel [NHANES]    Frequency of Communication with Friends and Family: More than three times a week    Frequency of Social Gatherings with Friends and Family: Once a week    Attends Religious Services: 1 to 4 times per year    Active Member of [employer] or Organizations: No    Attends Engineer, structural: Not on file    Marital Status: Married    Current Medications:  Current Outpatient Medications:    alendronate (FOSAMAX) 70 MG tablet, TAKE 1 TABLET EVERY WEEK WITH FULL GLASS OF WATER ON EMPTY STOMACH. SIT UPRIGHT FOR 30 MINUTES, Disp: 12 tablet, Rfl: 3   aspirin EC 81 MG EC tablet, Take 1 tablet (81 mg total) by mouth daily. Swallow whole., Disp: 30 tablet, Rfl: 11   atorvastatin (LIPITOR) 40 MG tablet, TAKE 1 TABLET BY MOUTH AT  BEDTIME, Disp: 90 tablet, Rfl: 3   Calcium Carbonate (CALCIUM-CARB 600 PO), Take 1 tablet by mouth daily., Disp: , Rfl:    cholecalciferol (VITAMIN D) 1000 UNITS tablet, Take 1,000 Units by mouth daily., Disp: , Rfl:    estradiol (ESTRACE VAGINAL) 0.1 MG/GM vaginal cream, Place 1  Applicatorful vaginally at bedtime. Apply a small amount (using your finger) inside the vagina twice a week at night, Disp: 42.5 g, Rfl: 12   pantoprazole (PROTONIX) 20 MG  tablet, TAKE 1 TABLET BY MOUTH DAILY, Disp: 90 tablet, Rfl: 3  Review of Systems: + cough, constipation Denies appetite changes, fevers, chills, fatigue, unexplained weight changes. Denies hearing loss, neck lumps or masses, mouth sores, ringing in ears or voice changes. Denies wheezing.  Denies shortness of breath. Denies chest pain or palpitations. Denies leg swelling. Denies abdominal distention, pain, blood in stools, diarrhea, nausea, vomiting, or early satiety. Denies pain with intercourse, dysuria, frequency, hematuria or incontinence. Denies hot flashes, pelvic pain, vaginal bleeding or vaginal discharge.   Denies joint pain, back pain or muscle pain/cramps. Denies itching, rash, or wounds. Denies dizziness, headaches, numbness or seizures. Denies swollen lymph nodes or glands, denies easy bruising or bleeding. Denies anxiety, depression, confusion, or decreased concentration.  Physical Exam: BP 128/84 (BP Location: Right Arm, Patient Position: Sitting)   Pulse 71   Temp 99.1 F (37.3 C) (Oral)   Resp 20   Wt 177 lb 6.4 oz (80.5 kg)   LMP 08/21/2002   SpO2 96%   BMI 28.63 kg/m  General: Alert, oriented, no acute distress. HEENT: Normocephalic, atraumatic, sclera anicteric. Chest: Clear to auscultation bilaterally.  No wheezes or rhonchi. Cardiovascular: Regular rate and rhythm, no murmurs. Abdomen: soft, nontender.  Normoactive bowel sounds.  No masses or hepatosplenomegaly appreciated.   Extremities: Grossly normal range of motion.  Warm, well perfused.  No edema bilaterally. Skin: No rashes or lesions noted. Lymphatics: No cervical, supraclavicular, or inguinal adenopathy. GU: Normal appearing external genitalia without erythema, excoriation, or lesions.  Speculum exam reveals radiation changes at the  vaginal apex and cervix, moderate atrophy also noted.  Area along the left upper vaginal sidewall now with atrophy and radiation changes, no findings concerning for necrosis.  Bimanual exam reveals cervix is flush with the vaginal apex long the posterior aspect, no firmness or nodularity.  Rectovaginal exam confirms these findings, no parametrial nodularity or thickening.  Laboratory & Radiologic Studies: None new  Assessment & Plan: TASHAY Taylor Buchanan is a 68 y.o. woman with Stage IB2 SCC who presents for follow-up after completion of chemoradiation in 11/2021. Last imaging (PET 09/2022) without metastatic/recurrent disease.   Patient is doing well and is NED on exam today. Pap/HPV testing due in March/April.   Continue vaginal estrogen in the setting of her atrophy.   Per NCCN surveillance recommendations, we will continue to alternate visits every 3 months between my office and Dr. Roselind Messier. Discussed signs and symptoms that would be concerning for cancer recurrence, and I stressed the importance of calling if she develops any of these before her next visit.   22 minutes of total time was spent for this patient encounter, including preparation, face-to-face counseling with the patient and coordination of care, and documentation of the encounter.  Eugene Garnet, MD  Division of Gynecologic Oncology  Department of Obstetrics and Gynecology  [facility]

## 2023-09-24 ENCOUNTER — Other Ambulatory Visit: Payer: Self-pay | Admitting: Family Medicine

## 2023-09-26 ENCOUNTER — Ambulatory Visit: Payer: Medicare Other

## 2023-09-26 VITALS — Wt 177.0 lb

## 2023-09-26 DIAGNOSIS — Z0000 Encounter for general adult medical examination without abnormal findings: Secondary | ICD-10-CM

## 2023-09-26 NOTE — Progress Notes (Signed)
 Subjective:   Taylor Buchanan is a 68 y.o. female who presents for Medicare Annual (Subsequent) preventive examination.  Visit Complete: Virtual I connected with  Taylor Buchanan on 09/26/23 by a audio enabled telemedicine application and verified that I am speaking with the correct person using two identifiers.  Patient Location: Home  Provider Location: Home Office  I discussed the limitations of evaluation and management by telemedicine. The patient expressed understanding and agreed to proceed.  Vital Signs: Because this visit was a virtual/telehealth visit, some criteria may be missing or patient reported. Any vitals not documented were not able to be obtained and vitals that have been documented are patient reported.  Patient Medicare AWV questionnaire was completed by the patient on 09/23/23; I have confirmed that all information answered by patient is correct and no changes since this date.  Cardiac Risk Factors include: advanced age (>51men, >67 women);dyslipidemia     Objective:    Today's Vitals   09/26/23 1428  Weight: 177 lb (80.3 kg)   Body mass index is 28.57 kg/m.     05/14/2023   11:42 AM 10/30/2022   10:47 AM 09/21/2022    9:10 AM 09/08/2022   12:59 PM 05/25/2022   11:37 AM 12/22/2021   10:50 AM 11/22/2021    9:07 AM  Advanced Directives  Does Patient Have a Medical Advance Directive? No No No No No No No  Would patient like information on creating a medical advance directive? No - Patient declined No - Patient declined No - Patient declined No - Patient declined No - Patient declined No - Patient declined No - Patient declined    Current Medications (verified) Outpatient Encounter Medications as of 09/26/2023  Medication Sig   alendronate  (FOSAMAX ) 70 MG tablet TAKE 1 TABLET EVERY WEEK WITH FULL GLASS OF WATER ON EMPTY STOMACH. SIT UPRIGHT FOR 30 MINUTES   aspirin  EC 81 MG EC tablet Take 1 tablet (81 mg total) by mouth daily. Swallow whole.   atorvastatin  (LIPITOR) 40  MG tablet TAKE 1 TABLET BY MOUTH AT  BEDTIME   Calcium  Carbonate (CALCIUM -CARB 600 PO) Take 1 tablet by mouth daily.   cholecalciferol (VITAMIN D ) 1000 UNITS tablet Take 1,000 Units by mouth daily.   cyclobenzaprine  (FLEXERIL ) 10 MG tablet TAKE 1 TABLET BY MOUTH AT BEDTIME AS NEEDED FOR MUSCLE SPASMS   estradiol  (ESTRACE  VAGINAL) 0.1 MG/GM vaginal cream Place 1 Applicatorful vaginally at bedtime. Apply a small amount (using your finger) inside the vagina twice a week at night   pantoprazole  (PROTONIX ) 20 MG tablet TAKE 1 TABLET BY MOUTH DAILY   No facility-administered encounter medications on file as of 09/26/2023.    Allergies (verified) Patient has no known allergies.   History: Past Medical History:  Diagnosis Date   Abnormal Pap smear of cervix    Arthritis    Cervical cancer (HCC)    Depression    GAD (generalized anxiety disorder)    GERD (gastroesophageal reflux disease)    History of migraine    History of radiation therapy    cervix - pelvis  09/12/2021-10/14/2021 Dr Lynwood Nasuti   History of radiation therapy    cervix - HDR brachy tandem and ring 10/25/2021-11/22/2021  Dr Lynwood Nasuti   History of transient ischemic attack (TIA) 01/2021   neruologist--- dr rosemarie;  [facility]/ 2022 in [facility] ,  per note complicated migraine vs anxiety,  less likely TIA   HLD (hyperlipidemia)    Osteoporosis    Pain  disorder    Scoliosis    Stroke [facility]) 2022   TIA   Torn ACL (anterior cruciate ligament) left hand 11/05/2021   wearing splint seeing ortho md week of 11-14-2021   Past Surgical History:  Procedure Laterality Date   CERVICAL CONIZATION W/BX N/A 08/04/2021   Procedure: CONIZATION CERVIX WITH BIOPSY: DILATION AND CURETTAGE OF UTERUS;  Surgeon: Delana Ted Morrison, DO;  Location: [facility];  Service: Gynecology;  Laterality: N/A;   CHOLECYSTECTOMY OPEN  2005   approx   IR IMAGING GUIDED PORT INSERTION  09/08/2021   IR REMOVAL TUN ACCESS W/ PORT W/O FL MOD SED   03/03/2022   OPERATIVE ULTRASOUND N/A 10/25/2021   Procedure: OPERATIVE ULTRASOUND;  Surgeon: Shannon Agent, MD;  Location: [facility];  Service: Urology;  Laterality: N/A;   OPERATIVE ULTRASOUND N/A 11/03/2021   Procedure: OPERATIVE ULTRASOUND;  Surgeon: Shannon Agent, MD;  Location: [facility];  Service: Urology;  Laterality: N/A;   OPERATIVE ULTRASOUND N/A 11/10/2021   Procedure: OPERATIVE ULTRASOUND;  Surgeon: Shannon Agent, MD;  Location: [facility];  Service: Urology;  Laterality: N/A;   OPERATIVE ULTRASOUND N/A 11/18/2021   Procedure: OPERATIVE ULTRASOUND;  Surgeon: Shannon Agent, MD;  Location: [facility];  Service: Urology;  Laterality: N/A;   OPERATIVE ULTRASOUND N/A 11/22/2021   Procedure: OPERATIVE ULTRASOUND;  Surgeon: Shannon Agent, MD;  Location: [facility];  Service: Urology;  Laterality: N/A;   TANDEM RING INSERTION N/A 10/25/2021   Procedure: TANDEM RING INSERTION;  Surgeon: Shannon Agent, MD;  Location: [facility];  Service: Urology;  Laterality: N/A;   TANDEM RING INSERTION N/A 11/03/2021   Procedure: TANDEM RING INSERTION;  Surgeon: Shannon Agent, MD;  Location: [facility];  Service: Urology;  Laterality: N/A;   TANDEM RING INSERTION N/A 11/10/2021   Procedure: TANDEM RING INSERTION;  Surgeon: Shannon Agent, MD;  Location: [facility];  Service: Urology;  Laterality: N/A;   TANDEM RING INSERTION N/A 11/18/2021   Procedure: TANDEM RING INSERTION;  Surgeon: Shannon Agent, MD;  Location: [facility];  Service: Urology;  Laterality: N/A;   TANDEM RING INSERTION N/A 11/22/2021   Procedure: TANDEM RING INSERTION;  Surgeon: Shannon Agent, MD;  Location: [facility];  Service: Urology;  Laterality: N/A;   TENDON REPAIR Left 2008   Ankle/ foot   Family History  Problem Relation Age of Onset   Mental illness Mother    Stroke Mother     Dementia Mother    Alzheimer's disease Mother    Bladder Cancer Father    Healthy Brother    Healthy Brother    Healthy Brother    CAD Maternal Grandmother    CAD Maternal Grandfather    Healthy Son    Healthy Son    Colon cancer Neg Hx    Breast cancer Neg Hx    Ovarian cancer Neg Hx    Endometrial cancer Neg Hx    Pancreatic cancer Neg Hx    Prostate cancer Neg Hx    Colon polyps Neg Hx    Esophageal cancer Neg Hx    Stomach cancer Neg Hx    Rectal cancer Neg Hx    Social History   Socioeconomic History   Marital status: Married    Spouse name: Not on file   Number of children: 2   Years of education: Not on file   Highest education level: Bachelor's  degree (e.g., BA, AB, BS)  Occupational History    Employer: [employer]    Comment: retired   Occupation: retired  Tobacco Use   Smoking status: Former    Current packs/day: 0.00    Average packs/day: 1 pack/day for 15.0 years (15.0 ttl pk-yrs)    Types: Cigarettes    Start date: 38    Quit date: 1997    Years since quitting: 28.1   Smokeless tobacco: Never  Vaping Use   Vaping status: Never Used  Substance and Sexual Activity   Alcohol use: Yes    Alcohol/week: 3.0 standard drinks of alcohol    Types: 3 Cans of beer per week    Comment: social   Drug use: Yes    Types: Marijuana    Comment: 1-2 times over past year   Sexual activity: Yes    Birth control/protection: Post-menopausal  Other Topics Concern   Not on file  Social History Narrative   Work or School: publishing [employer]      Home Situation: lives with husband      Spiritual Beliefs: Christian      Lifestyle: no regular exercise; diet good            Social Drivers of Corporate Investment Banker Strain: Low Risk  (09/25/2023)   Overall Financial Resource Strain (CARDIA)    Difficulty of Paying Living Expenses: Not hard at all  Food Insecurity: No Food Insecurity (09/25/2023)   Hunger Vital Sign    Worried About Running Out  of Food in the Last Year: Never true    Ran Out of Food in the Last Year: Never true  Transportation Needs: No Transportation Needs (09/25/2023)   PRAPARE - Administrator, Civil Service (Medical): No    Lack of Transportation (Non-Medical): No  Physical Activity: Insufficiently Active (09/25/2023)   Exercise Vital Sign    Days of Exercise per Week: 3 days    Minutes of Exercise per Session: 30 min  Stress: No Stress Concern Present (09/25/2023)   [employer] of Occupational Health - Occupational Stress Questionnaire    Feeling of Stress : Not at all  Social Connections: Moderately Integrated (09/25/2023)   Social Connection and Isolation Panel [NHANES]    Frequency of Communication with Friends and Family: Twice a week    Frequency of Social Gatherings with Friends and Family: Once a week    Attends Religious Services: Never    Database Administrator or Organizations: Yes    Attends Engineer, Structural: More than 4 times per year    Marital Status: Married    Tobacco Counseling Counseling given: Not Answered   Clinical Intake:  Pre-visit preparation completed: Yes  Pain : No/denies pain     BMI - recorded: 28.57 Nutritional Status: BMI 25 -29 Overweight Diabetes: No  How often do you need to have someone help you when you read instructions, pamphlets, or other written materials from your doctor or pharmacy?: 1 - Never  Interpreter Needed?: No  Information entered by :: Ellouise Haws, LPN   Activities of Daily Living    09/23/2023    5:58 PM 09/07/2023   10:00 AM  In your present state of health, do you have any difficulty performing the following activities:  Hearing? 0 0  Vision? 0 0  Difficulty concentrating or making decisions? 0 0  Walking or climbing stairs? 0 0  Dressing or bathing? 0 0  Doing errands,  shopping? 0 0  Preparing Food and eating ? N N  Using the Toilet? N N  In the past six months, have you accidently leaked urine? CINDERELLA CINDERELLA   Comment wears a pad at times   Do you have problems with loss of bowel control? N N  Managing your Medications? N N  Managing your Finances? N N  Housekeeping or managing your Housekeeping? N N    Patient Care Team: Jodie Lavern CROME, MD as PCP - General (Family Medicine) Key, Hargis HERO, NP as Nurse Practitioner (Gynecology) Dow Maxwell, PT as Physical Therapist (Physical Therapy) Lonn Hicks, MD as Consulting Physician (Hematology and Oncology) Federico Rosario BROCKS, MD as Consulting Physician (Gastroenterology)  Indicate any recent Medical Services you may have received from other than [facility] providers in the past year (date may be approximate).     Assessment:   This is a routine wellness examination for Taylor Buchanan.  Hearing/Vision screen Hearing Screening - Comments:: Pt denies any hearing issues  Vision Screening - Comments:: Pt follows up with Dr newt for annual eye exams    Goals Addressed             This Visit's Progress    Patient Stated         Depression Screen    09/26/2023    2:31 PM 07/05/2023   10:32 AM 02/12/2023    1:09 PM 09/08/2022    1:00 PM 02/08/2022    1:04 PM 03/15/2021    9:39 AM 04/18/2019    3:38 PM  PHQ 2/9 Scores  PHQ - 2 Score 0 0 0 0 0 0 0    Fall Risk    09/23/2023    5:58 PM 09/07/2023   10:00 AM 07/05/2023   10:32 AM 02/12/2023    1:05 PM 09/08/2022   12:03 PM  Fall Risk   Falls in the past year? 0 1 0 0 0  Number falls in past yr: 0 0 0 0 0  Injury with Fall? 0 0 0 0 0  Risk for fall due to : No Fall Risks  No Fall Risks No Fall Risks Impaired vision  Follow up Falls prevention discussed  Falls evaluation completed Falls evaluation completed Falls prevention discussed    MEDICARE RISK AT HOME: Medicare Risk at Home Any stairs in or around the home?: (Patient-Rptd) Yes If so, are there any without handrails?: (Patient-Rptd) No Home free of loose throw rugs in walkways, pet beds, electrical cords, etc?: (Patient-Rptd) Yes Adequate  lighting in your home to reduce risk of falls?: (Patient-Rptd) Yes Life alert?: (Patient-Rptd) No Use of a cane, walker or w/c?: (Patient-Rptd) No Grab bars in the bathroom?: (Patient-Rptd) Yes Shower chair or bench in shower?: (Patient-Rptd) No Elevated toilet seat or a handicapped toilet?: (Patient-Rptd) Yes  TIMED UP AND GO:  Was the test performed?  No    Cognitive Function:    09/26/2023    2:36 PM  MMSE - Mini Mental State Exam  Not completed: Refused        Immunizations Immunization History  Administered Date(s) Administered   Fluad Quad(high Dose 65+) 05/30/2022   Fluad Trivalent(High Dose 65+) 06/14/2023   Influenza,inj,Quad PF,6+ Mos 04/18/2019, 08/05/2020, 05/31/2021   PFIZER(Purple Top)SARS-COV-2 Vaccination 09/26/2019, 10/30/2019, 11/20/2019   Tdap 09/02/2013   Zoster Recombinant(Shingrix) 08/29/2019, 02/26/2020    TDAP status: Due, Education has been provided regarding the importance of this vaccine. Advised may receive this vaccine at local pharmacy or Health Dept. Aware to provide  a copy of the vaccination record if obtained from local pharmacy or Health Dept. Verbalized acceptance and understanding.  Flu Vaccine status: Up to date  Pneumococcal vaccine status: Due, Education has been provided regarding the importance of this vaccine. Advised may receive this vaccine at local pharmacy or Health Dept. Aware to provide a copy of the vaccination record if obtained from local pharmacy or Health Dept. Verbalized acceptance and understanding.  Covid-19 vaccine status: Information provided on how to obtain vaccines.   Qualifies for Shingles Vaccine? Yes   Zostavax completed Yes   Shingrix Completed?: Yes  Screening Tests Health Maintenance  Topic Date Due   COVID-19 Vaccine (4 - 2024-25 season) 04/22/2023   DTaP/Tdap/Td (2 - Td or Tdap) 09/03/2023   Pneumonia Vaccine 55+ Years old (1 of 2 - PCV) 02/12/2024 (Originally 09/04/1961)   MAMMOGRAM  10/13/2023    Medicare Annual Wellness (AWV)  09/25/2024   DEXA SCAN  09/07/2025   Colonoscopy  04/24/2033   INFLUENZA VACCINE  Completed   Hepatitis C Screening  Completed   Zoster Vaccines- Shingrix  Completed   HPV VACCINES  Aged Out    Health Maintenance  Health Maintenance Due  Topic Date Due   COVID-19 Vaccine (4 - 2024-25 season) 04/22/2023   DTaP/Tdap/Td (2 - Td or Tdap) 09/03/2023    Colorectal cancer screening: Type of screening: Colonoscopy. Completed 04/25/23. Repeat every 10 years  Mammogram status: Completed 10/12/22. Repeat every year  Bone Density status: Completed 09/07/22. Results reflect: Bone density results: OSTEOPOROSIS. Repeat every 3 years.  Additional Screening:  Hepatitis C Screening: Completed 08/29/19  Vision Screening: Recommended annual ophthalmology exams for early detection of glaucoma and other disorders of the eye. Is the patient up to date with their annual eye exam?  Yes  Who is the provider or what is the name of the office in which the patient attends annual eye exams? Dr Newt If pt is not established with a provider, would they like to be referred to a provider to establish care? No .   Dental Screening: Recommended annual dental exams for proper oral hygiene  Community Resource Referral / Chronic Care Management: CRR required this visit?  No   CCM required this visit?  No     Plan:     I have personally reviewed and noted the following in the patient's chart:   Medical and social history Use of alcohol, tobacco or illicit drugs  Current medications and supplements including opioid prescriptions. Patient is not currently taking opioid prescriptions. Functional ability and status Nutritional status Physical activity Advanced directives List of other physicians Hospitalizations, surgeries, and ER visits in previous 12 months Vitals Screenings to include cognitive, depression, and falls Referrals and appointments  In addition, I have  reviewed and discussed with patient certain preventive protocols, quality metrics, and best practice recommendations. A written personalized care plan for preventive services as well as general preventive health recommendations were provided to patient.     Ellouise VEAR Haws, LPN   02/21/7973   After Visit Summary: (MyChart) Due to this being a telephonic visit, the after visit summary with patients personalized plan was offered to patient via MyChart   Nurse Notes: none

## 2023-09-26 NOTE — Patient Instructions (Signed)
 Taylor Buchanan , Thank you for taking time to come for your Medicare Wellness Visit. I appreciate your ongoing commitment to your health goals. Please review the following plan we discussed and let me know if I can assist you in the future.   Referrals/Orders/Follow-Ups/Clinician Recommendations: Aim for 30 minutes of exercise or brisk walking, 6-8 glasses of water, and 5 servings of fruits and vegetables each day. If you wish to quit smoking, help is available. For free tobacco cessation program offerings call the [facility] at (947)146-5142 or Live Well Line at 9250598136. You may also visit www.[place].com or email livelifewell@[place] .com for more information on other programs.   You may also call 1-800-QUIT-NOW ((424)876-8004) or visit www.[place].ch or www.[facility].org for additional resources on smoking cessation.    This is a list of the screening recommended for you and due dates:  Health Maintenance  Topic Date Due   COVID-19 Vaccine (4 - 2024-25 season) 04/22/2023   DTaP/Tdap/Td vaccine (2 - Td or Tdap) 09/03/2023   Medicare Annual Wellness Visit  09/09/2023   Pneumonia Vaccine (1 of 2 - PCV) 02/12/2024*   Mammogram  10/13/2023   DEXA scan (bone density measurement)  09/07/2025   Colon Cancer Screening  04/24/2033   Flu Shot  Completed   Hepatitis C Screening  Completed   Zoster (Shingles) Vaccine  Completed   HPV Vaccine  Aged Out  *Topic was postponed. The date shown is not the original due date.    Advanced directives: (Declined) Advance directive discussed with you today. Even though you declined this today, please call our office should you change your mind, and we can give you the proper paperwork for you to fill out.  Next Medicare Annual Wellness Visit scheduled for next year: Yes

## 2023-10-01 ENCOUNTER — Telehealth: Payer: Self-pay

## 2023-10-01 ENCOUNTER — Other Ambulatory Visit (HOSPITAL_COMMUNITY): Payer: Self-pay

## 2023-10-01 NOTE — Telephone Encounter (Signed)
 Noted.

## 2023-10-01 NOTE — Telephone Encounter (Signed)
 Pharmacy Patient Advocate Encounter  Received notification from [facility] that Prior Authorization for Cyclobenzaprine  10mg   has been APPROVED from 10/01/23 to 08/20/98   PA #/Case ID/Reference #: [id number]

## 2023-10-01 NOTE — Telephone Encounter (Signed)
 Pharmacy Patient Advocate Encounter   Received notification from Pt Calls Messages that prior authorization for Cyclobenzaprine  10mg  tabs is required/requested.   Insurance verification completed.   The patient is insured through [facility] .   Per test claim: PA required; PA submitted to above mentioned insurance via CoverMyMeds Key/confirmation #/EOC [id number] Status is pending

## 2023-10-01 NOTE — Telephone Encounter (Signed)
 Please run a PA for cyclobenzaprine  10mg  for pt  Thank you,  [place]

## 2023-10-13 LAB — HM MAMMOGRAPHY: HM Mammogram: NORMAL (ref 0–4)

## 2023-10-24 ENCOUNTER — Other Ambulatory Visit: Payer: Self-pay | Admitting: Family Medicine

## 2023-10-25 ENCOUNTER — Other Ambulatory Visit: Payer: Self-pay | Admitting: Family Medicine

## 2023-12-15 NOTE — Progress Notes (Signed)
 Radiation Oncology         [phone] ________________________________  Name: Taylor Buchanan MRN: [id number]  Date: 12/17/2023  DOB: 1956/06/09  Follow-Up Visit Note  CC: Taylor Saha, MD  Taylor Essex, MD  No diagnosis found.  Diagnosis:  The encounter diagnosis was Malignant neoplasm of cervix, unspecified site [facility]   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)  Interval Since Last Radiation: 2 years and 24 days   Intent: Curative  Radiation Treatment Dates: 09/12/2021 through 11/22/2021   IMRT pelvis: 09/12/21 through 10/14/21   Brachytherapy cevix: 10/25/21 through 11/22/21   Site Technique Total Dose (Gy) Dose per Fx (Gy) Completed Fx Beam Energies  Pelvis: Pelvis IMRT 45/45 1.8 25/25 6X  Cervix: Cervix_Bst_Fx1 HDR-brachy 5.5/5.5 5.5 1/1 Ir-192  Cervix: Cervix_Bst_Fx2 HDR-brachy 5.5/5.5 5.5 1/1 Ir-192  Cervix: Cervix_Bst_Fx3 HDR-brachy 5.5/5.5 5.5 1/1 Ir-192  Cervix: Cervix_Bst_Fx4 HDR-brachy 5.5/5.5 5.5 1/1 Ir-192  Cervix: Cervix_Bst_Fx5 HDR-brachy 5.5/5.5 5.5 1/1 Ir-192    Narrative:  The patient returns today for routine follow-up. She was last seen here for follow-up on 05/14/23.      Since her last visit, she most recently followed up with Dr. Orvil Bland on 09/07/23. At which time, she denied any symptoms concerning for disease recurrence and she was noted as NED on examination. In the setting of persistent vaginal atrophy w/ radiation changes, she was advised to continue using her vaginal estrogen.      Of note: She was seen at an urgent care facility on 08/24/23 for a URI and was prescribed azithromycin , prednisone , and tessalon  for management.  A chest x-ray was also performed at that time which showed no acute cardiopulmonary findings.        No other significant interval history since the patient was last seen for follow-up.   ***              Allergies:  has no known allergies.  Meds: Current Outpatient Medications  Medication Sig  Dispense Refill   alendronate  (FOSAMAX ) 70 MG tablet TAKE 1 TABLET EVERY WEEK WITH FULL GLASS OF WATER ON EMPTY STOMACH. SIT UPRIGHT FOR 30 MINUTES 12 tablet 3   aspirin  EC 81 MG EC tablet Take 1 tablet (81 mg total) by mouth daily. Swallow whole. 30 tablet 11   atorvastatin  (LIPITOR) 40 MG tablet TAKE 1 TABLET AT BEDTIME 90 tablet 3   Calcium  Carbonate (CALCIUM -CARB 600 PO) Take 1 tablet by mouth daily.     cholecalciferol (VITAMIN D ) 1000 UNITS tablet Take 1,000 Units by mouth daily.     cyclobenzaprine  (FLEXERIL ) 10 MG tablet TAKE 1 TABLET BY MOUTH AT BEDTIME AS NEEDED FOR MUSCLE SPASMS 90 tablet 3   estradiol  (ESTRACE  VAGINAL) 0.1 MG/GM vaginal cream Place 1 Applicatorful vaginally at bedtime. Apply a small amount (using your finger) inside the vagina twice a week at night 42.5 g 12   pantoprazole  (PROTONIX ) 20 MG tablet TAKE 1 TABLET BY MOUTH EVERY DAY 90 tablet 3   No current facility-administered medications for this encounter.    Physical Findings: The patient is in no acute distress. Patient is alert and oriented.  vitals were not taken for this visit. .  No significant changes. Lungs are clear to auscultation bilaterally. Heart has regular rate and rhythm. No palpable cervical, supraclavicular, or axillary adenopathy. Abdomen soft, non-tender, normal bowel sounds.  On pelvic examination the external genitalia were unremarkable. A speculum exam was performed. There are no mucosal lesions noted in the vaginal  vault. A Pap smear was obtained of the proximal vagina. On bimanual and rectovaginal examination there were no pelvic masses appreciated. ***   Lab Findings: Lab Results  Component Value Date   WBC 4.5 02/13/2023   HGB 12.9 02/13/2023   HCT 38.6 02/13/2023   MCV 100.9 (H) 02/13/2023   PLT 281.0 02/13/2023    Radiographic Findings: No results found.  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   ***  Plan:  ***   *** minutes of total time was spent for this patient encounter, including preparation, face-to-face counseling with the patient and coordination of care, physical exam, and documentation of the encounter. ____________________________________  Noralee Beam, PhD, MD  This document serves as a record of services personally performed by Retta Caster, MD. It was created on his behalf by Aleta Anda, a trained medical scribe. The creation of this record is based on the scribe's personal observations and the provider's statements to them. This document has been checked and approved by the attending provider.

## 2023-12-17 ENCOUNTER — Other Ambulatory Visit (HOSPITAL_COMMUNITY)
Admission: RE | Admit: 2023-12-17 | Discharge: 2023-12-17 | Disposition: A | Discharge status: Home / Self Care | Source: Ambulatory Visit

## 2023-12-17 ENCOUNTER — Ambulatory Visit: Attending: Family Medicine

## 2023-12-17 ENCOUNTER — Ambulatory Visit
Admission: RE | Admit: 2023-12-17 | Discharge: 2023-12-17 | Disposition: A | Discharge status: Home / Self Care | Payer: Self-pay | Source: Ambulatory Visit | Attending: Radiation Oncology | Admitting: Radiation Oncology

## 2023-12-17 VITALS — BP 116/80 | HR 75 | Temp 97.7°F | Resp 20

## 2023-12-17 DIAGNOSIS — Z8541 Personal history of malignant neoplasm of cervix uteri: Secondary | ICD-10-CM | POA: Insufficient documentation

## 2023-12-17 DIAGNOSIS — Z923 Personal history of irradiation: Secondary | ICD-10-CM | POA: Diagnosis not present

## 2023-12-17 DIAGNOSIS — C539 Malignant neoplasm of cervix uteri, unspecified: Secondary | ICD-10-CM | POA: Insufficient documentation

## 2023-12-17 DIAGNOSIS — Z7982 Long term (current) use of aspirin: Secondary | ICD-10-CM | POA: Insufficient documentation

## 2023-12-17 DIAGNOSIS — Z79899 Other long term (current) drug therapy: Secondary | ICD-10-CM | POA: Diagnosis not present

## 2023-12-17 NOTE — Progress Notes (Signed)
 Algie Antis is here today for follow up post radiation to the pelvic.  They completed their radiation on: 11/22/2021  Does the patient complain of any of the following:  Pain:Denies Abdominal bloating: Denies Diarrhea/Constipation: Denies Nausea/Vomiting: Denies Vaginal Discharge: Denies Blood in Urine or Stool: Denies Urinary Issues (dysuria/incomplete emptying/ incontinence/ increased frequency/urgency): Denies Does patient report using vaginal dilator 2-3 times a week and/or sexually active 2-3 weeks: Denies Post radiation skin changes: Denies    BP 116/80 (Patient Position: Sitting)   Pulse 75   Temp 97.7 F (36.5 C) (Oral)   Resp 20   LMP 08/21/2002   SpO2 100%

## 2023-12-17 NOTE — Addendum Note (Signed)
 Encounter addended by: Baldwin Bones, LPN on: 2/95/6213 11:43 AM  Actions taken: Charge Capture section accepted

## 2023-12-18 ENCOUNTER — Telehealth: Payer: Self-pay | Admitting: Radiation Oncology

## 2023-12-18 NOTE — Telephone Encounter (Signed)
 Returned pt's call asking to push out 10/27 appt. Spoke to pt who agreed to 06/23/24@10am .

## 2023-12-19 ENCOUNTER — Encounter: Payer: Self-pay | Admitting: Gynecologic Oncology

## 2023-12-19 ENCOUNTER — Telehealth: Payer: Self-pay

## 2023-12-19 ENCOUNTER — Telehealth: Payer: Self-pay | Admitting: Oncology

## 2023-12-19 LAB — CYTOLOGY - PAP
Comment: NEGATIVE
Diagnosis: NEGATIVE
High risk HPV: POSITIVE — AB

## 2023-12-19 NOTE — Telephone Encounter (Addendum)
 Taylor Buchanan and offered her an appointment with Dr. Gaylin Ke on 12/26/23.  Patient declined as she will be out of town. Scheduled her for follow up/colposcopy on 01/16/24 at 9:30.  Discussed that pap smear is not showing precancer or cancer, just high risk HPV seen. We will look closer with magnification in the office.  She verbalized understanding and agreement.

## 2023-12-19 NOTE — Telephone Encounter (Signed)
 Taylor Buchanan called back and said she will be out of town on 01/16/24.  Rescheduled appointment to 01/30/24 at 9:30.

## 2023-12-19 NOTE — Telephone Encounter (Signed)
 Called placed to patient to make aware of abnormal pap smear results and plan to follow up with Dr. Orvil Bland for colposcopy. Patient voiced understanding. Patient also voiced that she will be going out of town on May 2nd for 10 days.

## 2024-01-01 ENCOUNTER — Other Ambulatory Visit: Payer: Self-pay | Admitting: Family Medicine

## 2024-01-16 ENCOUNTER — Inpatient Hospital Stay: Attending: Obstetrics & Gynecology | Admitting: Obstetrics & Gynecology

## 2024-01-16 ENCOUNTER — Ambulatory Visit: Admitting: Obstetrics & Gynecology

## 2024-01-16 VITALS — BP 136/63 | HR 81 | Temp 98.4°F | Resp 18 | Ht 66.0 in | Wt 172.2 lb

## 2024-01-16 DIAGNOSIS — C530 Malignant neoplasm of endocervix: Secondary | ICD-10-CM

## 2024-01-16 DIAGNOSIS — C539 Malignant neoplasm of cervix uteri, unspecified: Secondary | ICD-10-CM | POA: Diagnosis present

## 2024-01-16 NOTE — Progress Notes (Unsigned)
 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 Pap smear 1 months ago showed: NILM/HR-HPV pos untyped. The prior pap showed {Findings; lab pap smear results:16707::"no abnormalities"}.  Prior cervical/vaginal disease: {Exam; colposcopy summary:733}. Prior cervical treatment: {Therapies; recommendations colposcopy:727}.  Procedure Details  The risks and benefits of the procedure and {verbal:16408} informed consent obtained.  A time-out was performed confirming the patient, procedure and allergy status  Speculum placed in vagina and excellent visualization of cervix achieved, cervix swabbed x 3 with acetic acid solution.  Findings: Cervix: {Findings; colposcopy:728}; {Procedures; colposcopy:729}.   Vaginal inspection: {Findings; colposcopy:730}. Vulvar colposcopy: {Exam; colposcopy vulvar:731}.   Physical Exam   Specimens: ***  Complications: {complications:13423}.  Plan: {Plan; colonoscopy:734} For females who have undergone RT, we do not perform cervicovaginal cytology routinely as part of surveillance because it is rarely helpful. However, for those patients who did not undergo RT   Per [facility], the persistence of HR-HPV may be risk factor for disease recurrence, the clinical utility of HPV testing has not been established.  A study in 2022 showed that positive hrHPV testing in the surveillance setting was not associated with recurrence.  The findings did not support the routine use of hrHP testing for the evaluation of cervical cancer recurrence.

## 2024-01-16 NOTE — Patient Instructions (Signed)
Keep previously scheduled appointment.

## 2024-01-17 ENCOUNTER — Encounter: Payer: Self-pay | Admitting: Obstetrics & Gynecology

## 2024-01-30 ENCOUNTER — Ambulatory Visit: Admitting: Obstetrics & Gynecology

## 2024-02-13 ENCOUNTER — Encounter: Payer: Self-pay | Admitting: Family Medicine

## 2024-02-13 ENCOUNTER — Ambulatory Visit (INDEPENDENT_AMBULATORY_CARE_PROVIDER_SITE_OTHER): Payer: Medicare Other | Admitting: Family Medicine

## 2024-02-13 VITALS — BP 116/85 | HR 78 | Temp 97.7°F | Ht 66.0 in | Wt 173.0 lb

## 2024-02-13 DIAGNOSIS — E782 Mixed hyperlipidemia: Secondary | ICD-10-CM

## 2024-02-13 DIAGNOSIS — F4322 Adjustment disorder with anxiety: Secondary | ICD-10-CM | POA: Diagnosis not present

## 2024-02-13 DIAGNOSIS — M810 Age-related osteoporosis without current pathological fracture: Secondary | ICD-10-CM

## 2024-02-13 DIAGNOSIS — K219 Gastro-esophageal reflux disease without esophagitis: Secondary | ICD-10-CM

## 2024-02-13 DIAGNOSIS — C539 Malignant neoplasm of cervix uteri, unspecified: Secondary | ICD-10-CM

## 2024-02-13 DIAGNOSIS — M4125 Other idiopathic scoliosis, thoracolumbar region: Secondary | ICD-10-CM

## 2024-02-13 DIAGNOSIS — E538 Deficiency of other specified B group vitamins: Secondary | ICD-10-CM

## 2024-02-13 DIAGNOSIS — G459 Transient cerebral ischemic attack, unspecified: Secondary | ICD-10-CM

## 2024-02-13 LAB — CBC WITH DIFFERENTIAL/PLATELET
Basophils Absolute: 0 10*3/uL (ref 0.0–0.1)
Basophils Relative: 0.5 % (ref 0.0–3.0)
Eosinophils Absolute: 0 10*3/uL (ref 0.0–0.7)
Eosinophils Relative: 0.6 % (ref 0.0–5.0)
HCT: 41.6 % (ref 36.0–46.0)
Hemoglobin: 14 g/dL (ref 12.0–15.0)
Lymphocytes Relative: 18.2 % (ref 12.0–46.0)
Lymphs Abs: 1.1 10*3/uL (ref 0.7–4.0)
MCHC: 33.7 g/dL (ref 30.0–36.0)
MCV: 98.1 fl (ref 78.0–100.0)
Monocytes Absolute: 0.3 10*3/uL (ref 0.1–1.0)
Monocytes Relative: 5.4 % (ref 3.0–12.0)
Neutro Abs: 4.4 10*3/uL (ref 1.4–7.7)
Neutrophils Relative %: 75.3 % (ref 43.0–77.0)
Platelets: 297 10*3/uL (ref 150.0–400.0)
RBC: 4.24 Mil/uL (ref 3.87–5.11)
RDW: 14.2 % (ref 11.5–15.5)
WBC: 5.8 10*3/uL (ref 4.0–10.5)

## 2024-02-13 LAB — COMPREHENSIVE METABOLIC PANEL WITH GFR
ALT: 15 U/L (ref 0–35)
AST: 24 U/L (ref 0–37)
Albumin: 4.5 g/dL (ref 3.5–5.2)
Alkaline Phosphatase: 64 U/L (ref 39–117)
BUN: 17 mg/dL (ref 6–23)
CO2: 23 meq/L (ref 19–32)
Calcium: 9.9 mg/dL (ref 8.4–10.5)
Chloride: 104 meq/L (ref 96–112)
Creatinine, Ser: 0.95 mg/dL (ref 0.40–1.20)
GFR: 62.03 mL/min (ref >60.00)
Glucose, Bld: 100 mg/dL — ABNORMAL HIGH (ref 70–99)
Potassium: 4 meq/L (ref 3.5–5.1)
Sodium: 138 meq/L (ref 135–145)
Total Bilirubin: 0.8 mg/dL (ref 0.2–1.2)
Total Protein: 7.1 g/dL (ref 6.0–8.3)

## 2024-02-13 LAB — LIPID PANEL
Cholesterol: 160 mg/dL (ref 0–200)
HDL: 51.1 mg/dL (ref >39.00)
LDL Cholesterol: 87 mg/dL (ref 0–99)
NonHDL: 108.7
Total CHOL/HDL Ratio: 3
Triglycerides: 111 mg/dL (ref 0.0–149.0)
VLDL: 22.2 mg/dL (ref 0.0–40.0)

## 2024-02-13 LAB — VITAMIN B12: Vitamin B-12: 243 pg/mL (ref 211–911)

## 2024-02-13 LAB — TSH: TSH: 1.85 u[IU]/mL (ref 0.35–5.50)

## 2024-02-13 NOTE — Patient Instructions (Signed)
 Please return in 12 months for your annual complete physical; please come fasting. For follow up on chronic medical conditions   I will release your lab results to you on your MyChart account with further instructions. You may see the results before I do, but when I review them I will send you a message with my report or have my assistant call you if things need to be discussed. Please reply to my message with any questions. Thank you!   If you have any questions or concerns, please don't hesitate to send me a message via MyChart or call the office at [phone]. Thank you for visiting with us  today! It's our pleasure caring for you.

## 2024-02-13 NOTE — Progress Notes (Signed)
 Subjective  Chief Complaint  Patient presents with   Annual Exam    Pt here for Annual Exam and is not currently fasting    HPI: Taylor Buchanan is a 68 y.o. female who presents to [facility] [place] today for a Female Wellness Visit. She also has the concerns and/or needs as listed above in the chief complaint. These will be addressed in addition to the Health Maintenance Visit.   Wellness Visit: annual visit with health maintenance review and exam  HM: screens current. Eligible for tdap. Feels great.  Chronic disease f/u and/or acute problem visit: (deemed necessary to be done in addition to the wellness visit): Discussed the use of AI scribe software for clinical note transcription with the patient, who gave verbal consent to proceed.  History of Present Illness Taylor Buchanan is a 68 year old female who presents for a complete physical and follow-up.  She feels generally well, although she acknowledges being slightly overweight. Her mood remains stable and positive despite a stressful environment. No significant changes in her health are noted.  Regarding her history of cervical cancer, her recent Pap smears have been normal, but she continues to have the HPV virus. She is monitored every three months.gets mammo w/ gyn.   For hyperlipidemia, she is prescribed Lipitor but takes it inconsistently, estimating adherence at about 50%.  She takes daily medication for GERD and notices symptoms if she misses doses, with inconsistent adherence to this medication as well.  Osteoporosis treated with fosamax  and tolerating well. Dexa current.  Mood is controlled.  No back pain/ has scoliosis   Assessment  1. Mixed hyperlipidemia   2. Osteoporosis without current pathological fracture, unspecified osteoporosis type   3. Malignant neoplasm of cervix, unspecified site (HCC)   4. Adjustment disorder with anxiety   5. Vitamin B12 deficiency   6. TIA (transient ischemic  attack)   7. Gastroesophageal reflux disease without esophagitis   8. Other idiopathic scoliosis, thoracolumbar region      Plan  Female Wellness Visit: Age appropriate Health Maintenance and Prevention measures were discussed with patient. Included topics are cancer screening recommendations, ways to keep healthy (see AVS) including dietary and exercise recommendations, regular eye and dental care, use of seat belts, and avoidance of moderate alcohol use and tobacco use.  BMI: discussed patient's BMI and encouraged positive lifestyle modifications to help get to or maintain a target BMI. HM needs and immunizations were addressed and ordered. See below for orders. See HM and immunization section for updates. Routine labs and screening tests ordered including cmp, cbc and lipids where appropriate. Discussed recommendations regarding Vit D and calcium  supplementation (see AVS)  Chronic disease management visit and/or acute problem visit: Assessment and Plan Assessment & Plan Gastroesophageal Reflux Disease (GERD) Requires daily pantoprazole  for symptom control. - Continue pantoprazole  daily.  Hyperlipidemia Inconsistent atorvastatin  adherence potentially affecting cholesterol control. - Check cholesterol levels today. - Encourage adherence to atorvastatin .  Cervical Cancer with Persistent HPV Persistent HPV infection with history of cervical cancer. Under specialist monitoring. - Continue monitoring with specialist every three months.  Osteoporosis: continue fosamax .   General Health Maintenance Mammogram and Pap smears are current. Overweight but otherwise in good health. - Continue regular health screenings. - Encourage weight management.   Follow up: 12 mo for cpe  Orders Placed This Encounter  Procedures   HM MAMMOGRAPHY   TSH   CBC with Differential/Platelet   Comprehensive metabolic panel with GFR  Lipid panel   Vitamin B12   No orders of the defined types were  placed in this encounter.     Body mass index is 27.92 kg/m. Wt Readings from Last 3 Encounters:  02/13/24 173 lb (78.5 kg)  01/16/24 172 lb 3.2 oz (78.1 kg)  09/26/23 177 lb (80.3 kg)     Patient Active Problem List   Diagnosis Date Noted   Malignant neoplasm of cervix (HCC) 08/16/2021    Priority: High   Adjustment disorder with anxiety 05/31/2021    Priority: High    Due to health related stressors (cervical cancer). Treated successfully with lexapro  x 2 years. Weaned 2024.     Mixed hyperlipidemia 02/14/2021    Priority: High   TIA (transient ischemic attack) 01/29/2021    Priority: High    Vs migraine equivalent; evaluated by neuro Baby aspirin     Osteoporosis 09/02/2013    Priority: High    Dexa 06/2019: T - -3.3 radius, -2.3 hip. Failed biphosphonates in past. rec prolia . Restarted fosamax  05/2021 due to cost of prolia . Dexa 09/2022: lowest T = -3.4 radius, -2.4 hip on fosamax . Continue.     Degenerative joint disease (DJD) of lumbar spine 07/31/2023    Priority: Medium     Xrays 07/2023    Gastroesophageal reflux disease without esophagitis 02/08/2022    Priority: Low   Vitamin B12 deficiency 02/08/2022    Priority: Low   Scoliosis 04/18/2019    Priority: Low   Health Maintenance  Topic Date Due   DTaP/Tdap/Td (2 - Td or Tdap) 09/03/2023   COVID-19 Vaccine (4 - 2024-25 season) 02/29/2024 (Originally 04/22/2023)   Pneumococcal Vaccine: 50+ Years (1 of 2 - PCV) 02/12/2025 (Originally 09/05/1974)   INFLUENZA VACCINE  03/21/2024   Medicare Annual Wellness (AWV)  09/25/2024   MAMMOGRAM  10/12/2024   DEXA SCAN  09/07/2025   Colonoscopy  04/24/2033   Hepatitis C Screening  Completed   Zoster Vaccines- Shingrix  Completed   Hepatitis B Vaccines  Aged Out   HPV VACCINES  Aged Out   Meningococcal B Vaccine  Aged Out   Immunization History  Administered Date(s) Administered   Fluad Quad(high Dose 65+) 05/30/2022   Fluad Trivalent(High Dose 65+) 06/14/2023    Influenza,inj,Quad PF,6+ Mos 04/18/2019, 08/05/2020, 05/31/2021   PFIZER(Purple Top)SARS-COV-2 Vaccination 09/26/2019, 10/30/2019, 11/20/2019   Tdap 09/02/2013   Zoster Recombinant(Shingrix) 08/29/2019, 02/26/2020   We updated and reviewed the patient's past history in detail and it is documented below. Allergies: Patient has no known allergies. Past Medical History Patient  has a past medical history of Abnormal Pap smear of cervix, Arthritis, Cervical cancer (HCC), Depression, GAD (generalized anxiety disorder), GERD (gastroesophageal reflux disease), History of migraine, History of radiation therapy, History of radiation therapy, History of transient ischemic attack (TIA) (01/2021), HLD (hyperlipidemia), Leukopenia due to antineoplastic chemotherapy (HCC) (10/10/2021), Osteoporosis, Pain disorder, Scoliosis, Stroke (HCC) (2022), and Torn ACL (anterior cruciate ligament) left hand (11/05/2021). Past Surgical History Patient  has a past surgical history that includes Cholecystectomy open (2005); Tendon repair (Left, 2008); Cervical conization w/bx (N/A, 08/04/2021); IR IMAGING GUIDED PORT INSERTION (09/08/2021); Tandem ring insertion (N/A, 10/25/2021); Operative ultrasound (N/A, 10/25/2021); Tandem ring insertion (N/A, 11/03/2021); Operative ultrasound (N/A, 11/03/2021); Tandem ring insertion (N/A, 11/10/2021); Operative ultrasound (N/A, 11/10/2021); Tandem ring insertion (N/A, 11/18/2021); Operative ultrasound (N/A, 11/18/2021); Tandem ring insertion (N/A, 11/22/2021); Operative ultrasound (N/A, 11/22/2021); and IR REMOVAL TUN ACCESS W/ PORT W/O FL MOD SED (03/03/2022). Family History: Patient family history includes Alzheimer's disease  in her mother; Bladder Cancer in her father; CAD in her maternal grandfather and maternal grandmother; Dementia in her mother; Healthy in her brother, brother, brother, son, and son; Mental illness in her mother; Stroke in her mother. Social History:  Patient  reports that she quit  smoking about 28 years ago. Her smoking use included cigarettes. She started smoking about 43 years ago. She has a 15 pack-year smoking history. She has never used smokeless tobacco. She reports current alcohol use of about 3.0 standard drinks of alcohol per week. She reports current drug use. Drug: Marijuana.  Review of Systems: Constitutional: negative for fever or malaise Ophthalmic: negative for photophobia, double vision or loss of vision Cardiovascular: negative for chest pain, dyspnea on exertion, or new LE swelling Respiratory: negative for SOB or persistent cough Gastrointestinal: negative for abdominal pain, change in bowel habits or melena Genitourinary: negative for dysuria or gross hematuria, no abnormal uterine bleeding or disharge Musculoskeletal: negative for new gait disturbance or muscular weakness Integumentary: negative for new or persistent rashes, no breast lumps Neurological: negative for TIA or stroke symptoms Psychiatric: negative for SI or delusions Allergic/Immunologic: negative for hives  Patient Care Team    Relationship Specialty Notifications Start End  Jodie Lavern CROME, MD PCP - General Family Medicine  04/18/19   Key, Hargis HERO, NP Nurse Practitioner Gynecology  09/02/13   Dow Maxwell, PT Physical Therapist Physical Therapy  09/15/19   Lonn Hicks, MD Consulting Physician Hematology and Oncology  02/08/22   Federico Rosario BROCKS, MD Consulting Physician Gastroenterology  02/08/22     Objective  Vitals: BP 116/85   Pulse 78   Temp 97.7 F (36.5 C)   Ht 5' 6 (1.676 m)   Wt 173 lb (78.5 kg)   LMP 08/21/2002   SpO2 97%   BMI 27.92 kg/m  General:  Well developed, well nourished, no acute distress  Psych:  Alert and orientedx3,normal mood and affect HEENT:  Normocephalic, atraumatic, non-icteric sclera,  supple neck without adenopathy, mass or thyromegaly Cardiovascular:  Normal S1, S2, RRR without gallop, rub or murmur Respiratory:  Good breath sounds  bilaterally, CTAB with normal respiratory effort Gastrointestinal: normal bowel sounds, soft, non-tender, no noted masses. No HSM MSK: extremities without edema, joints without erythema or swelling. Scoliosis present Neurologic:    Mental status is normal.  Gross motor and sensory exams are normal.  No tremor  Commons side effects, risks, benefits, and alternatives for medications and treatment plan prescribed today were discussed, and the patient expressed understanding of the given instructions. Patient is instructed to call or message via MyChart if he/she has any questions or concerns regarding our treatment plan. No barriers to understanding were identified. We discussed Red Flag symptoms and signs in detail. Patient expressed understanding regarding what to do in case of urgent or emergency type symptoms.  Medication list was reconciled, printed and provided to the patient in AVS. Patient instructions and summary information was reviewed with the patient as documented in the AVS. This note was prepared with assistance of Dragon voice recognition software. Occasional wrong-word or sound-a-like substitutions may have occurred due to the inherent limitations of voice recognition software

## 2024-02-17 ENCOUNTER — Ambulatory Visit: Payer: Self-pay | Admitting: Family Medicine

## 2024-02-17 NOTE — Progress Notes (Signed)
 See mychart note Dear Ms. Taylor Buchanan, Your lab results look good! Your vitamin B12 is low again so please restart your daily supplements. No other changes are needed at this time.  It was good seeing you! Sincerely, Dr. Jodie

## 2024-03-05 ENCOUNTER — Encounter: Payer: Self-pay | Admitting: Gynecologic Oncology

## 2024-03-07 ENCOUNTER — Encounter: Payer: Self-pay | Admitting: Gynecologic Oncology

## 2024-03-07 ENCOUNTER — Inpatient Hospital Stay: Payer: Medicare Other | Attending: Obstetrics & Gynecology | Admitting: Gynecologic Oncology

## 2024-03-07 VITALS — BP 128/75 | HR 71 | Temp 98.6°F | Resp 18 | Wt 176.0 lb

## 2024-03-07 DIAGNOSIS — Z8541 Personal history of malignant neoplasm of cervix uteri: Secondary | ICD-10-CM | POA: Diagnosis present

## 2024-03-07 DIAGNOSIS — Z923 Personal history of irradiation: Secondary | ICD-10-CM | POA: Diagnosis not present

## 2024-03-07 DIAGNOSIS — Z87891 Personal history of nicotine dependence: Secondary | ICD-10-CM | POA: Diagnosis not present

## 2024-03-07 DIAGNOSIS — N952 Postmenopausal atrophic vaginitis: Secondary | ICD-10-CM

## 2024-03-07 DIAGNOSIS — Z9221 Personal history of antineoplastic chemotherapy: Secondary | ICD-10-CM | POA: Diagnosis not present

## 2024-03-07 DIAGNOSIS — C539 Malignant neoplasm of cervix uteri, unspecified: Secondary | ICD-10-CM

## 2024-03-07 NOTE — Patient Instructions (Signed)
 It was good to see you today.  I do not see or feel any evidence of cancer recurrence on your exam.  I will see you for follow-up in 6 months.  As always, if you develop any new and concerning symptoms before your next visit, please call to see me sooner.

## 2024-03-07 NOTE — Progress Notes (Signed)
 Gynecologic Oncology Return Clinic Visit  03/07/24  Reason for Visit: surveillance  Treatment History: Oncology History  Malignant neoplasm of cervix (HCC)  07/05/2021 Pathology Results   A. ENDOMETRIAL CURETTINGS:  Blood clots containing numerous strips of squamous epithelium with features of high-grade SIL(CIN-3/severe dysplasia).  Normal endocervical tissue or endometrial tissue are not identified.   B. CERVIX, CONE BIOPSY:  Invasive squamous cell carcinoma, focally keratinizing and moderately differentiated with circumferential involvement.  All margins including the deep margin are positive for squamous cell carcinoma.  Please see comment. P16 positive   08/16/2021 Initial Diagnosis   Malignant neoplasm of cervix (HCC)   08/30/2021 PET scan   Focal hypermetabolism in the region of the cervix, consistent with known primary cervical carcinoma.   No evidence of pelvic lymph node or distant metastatic disease.     09/01/2021 Cancer Staging   Staging form: Cervix Uteri, AJCC Version 9 - Clinical stage from 09/01/2021: FIGO Stage IB1 (cT1b1, cN0, cM0) - Signed by Lonn Hicks, MD on 09/01/2021 Stage prefix: Initial diagnosis   09/08/2021 Procedure   Successful placement of a right internal jugular approach power injectable Port-A-Cath. The catheter is ready for immediate use.     09/12/2021 - 11/22/2021 Radiation Therapy   IMRT pelvis: 09/12/21 through 10/14/21  Brachytherapy cevix: 10/25/21 through 11/22/21   Site Technique Total Dose (Gy) Dose per Fx (Gy) Completed Fx Beam Energies  Pelvis: Pelvis IMRT 45/45 1.8 25/25 6X  Cervix: Cervix_Bst_Fx1 HDR-brachy 5.5/5.5 5.5 1/1 Ir-192  Cervix: Cervix_Bst_Fx2 HDR-brachy 5.5/5.5 5.5 1/1 Ir-192  Cervix: Cervix_Bst_Fx3 HDR-brachy 5.5/5.5 5.5 1/1 Ir-192  Cervix: Cervix_Bst_Fx4 HDR-brachy 5.5/5.5 5.5 1/1 Ir-192  Cervix: Cervix_Bst_Fx5 HDR-brachy 5.5/5.5 5.5 1/1 Ir-192       09/13/2021 - 10/11/2021 Chemotherapy   Patient is on Treatment  Plan : cervical Cisplatin  q7d      02/23/2022 PET scan   Marked interval response to therapy with reduction of metabolic activity in the area of the cervix. Residual activity only minimally greater than mediastinal blood pool, attention on follow-up.   No signs of metastatic disease.   03/03/2022 Procedure   Removal of implanted Port-A-Cath utilizing sharp and blunt dissection. The procedure was uncomplicated.     Interval History: Doing well.  Denies any vaginal bleeding.  Continues to have regular intercourse.  Reports baseline bowel and bladder function.  Looking forward to a road trip to [place] this fall.  Past Medical/Surgical History: Past Medical History:  Diagnosis Date   Abnormal Pap smear of cervix    Arthritis    Cervical cancer (HCC)    Depression    GAD (generalized anxiety disorder)    GERD (gastroesophageal reflux disease)    History of migraine    History of radiation therapy    cervix - pelvis  09/12/2021-10/14/2021 Dr Lynwood Nasuti   History of radiation therapy    cervix - HDR brachy tandem and ring 10/25/2021-11/22/2021  Dr Lynwood Nasuti   History of transient ischemic attack (TIA) 01/2021   neruologist--- dr rosemarie;  ad[facility]/ 2022 in [facility] ,  per note complicated migraine vs anxiety,  less likely TIA   HLD (hyperlipidemia)    Leukopenia due to antineoplastic chemotherapy (HCC) 10/10/2021   Osteoporosis    Pain disorder    Scoliosis    Stroke [facility]) 2022   TIA   Torn ACL (anterior cruciate ligament) left hand 11/05/2021   wearing splint seeing ortho md week of 11-14-2021    Past Surgical History:  Procedure Laterality Date  CERVICAL CONIZATION W/BX N/A 08/04/2021   Procedure: CONIZATION CERVIX WITH BIOPSY: DILATION AND CURETTAGE OF UTERUS;  Surgeon: Delana Ted Morrison, DO;  Location: [facility];  Service: Gynecology;  Laterality: N/A;   CHOLECYSTECTOMY OPEN  2005   approx   IR IMAGING GUIDED PORT INSERTION  09/08/2021   IR REMOVAL  TUN ACCESS W/ PORT W/O FL MOD SED  03/03/2022   OPERATIVE ULTRASOUND N/A 10/25/2021   Procedure: OPERATIVE ULTRASOUND;  Surgeon: Shannon Agent, MD;  Location: [facility];  Service: Urology;  Laterality: N/A;   OPERATIVE ULTRASOUND N/A 11/03/2021   Procedure: OPERATIVE ULTRASOUND;  Surgeon: Shannon Agent, MD;  Location: [facility];  Service: Urology;  Laterality: N/A;   OPERATIVE ULTRASOUND N/A 11/10/2021   Procedure: OPERATIVE ULTRASOUND;  Surgeon: Shannon Agent, MD;  Location: [facility];  Service: Urology;  Laterality: N/A;   OPERATIVE ULTRASOUND N/A 11/18/2021   Procedure: OPERATIVE ULTRASOUND;  Surgeon: Shannon Agent, MD;  Location: [facility];  Service: Urology;  Laterality: N/A;   OPERATIVE ULTRASOUND N/A 11/22/2021   Procedure: OPERATIVE ULTRASOUND;  Surgeon: Shannon Agent, MD;  Location: [facility];  Service: Urology;  Laterality: N/A;   TANDEM RING INSERTION N/A 10/25/2021   Procedure: TANDEM RING INSERTION;  Surgeon: Shannon Agent, MD;  Location: [facility];  Service: Urology;  Laterality: N/A;   TANDEM RING INSERTION N/A 11/03/2021   Procedure: TANDEM RING INSERTION;  Surgeon: Shannon Agent, MD;  Location: [facility];  Service: Urology;  Laterality: N/A;   TANDEM RING INSERTION N/A 11/10/2021   Procedure: TANDEM RING INSERTION;  Surgeon: Shannon Agent, MD;  Location: [facility];  Service: Urology;  Laterality: N/A;   TANDEM RING INSERTION N/A 11/18/2021   Procedure: TANDEM RING INSERTION;  Surgeon: Shannon Agent, MD;  Location[facility]  Service: Urology;  Laterality: N/A;   TANDEM RING INSERTION N/A 11/22/2021   Procedure: TANDEM RING INSERTION;  Surgeon: Shannon Agent, MD;  Location: [facility];  Service: Urology;  Laterality: N/A;   TENDON REPAIR Left 2008   Ankle/ foot    Family History  Problem Relation Age of Onset   Mental  illness Mother    Stroke Mother    Dementia Mother    Alzheimer's disease Mother    Bladder Cancer Father    Healthy Brother    Healthy Brother    Healthy Brother    CAD Maternal Grandmother    CAD Maternal Grandfather    Healthy Son    Healthy Son    Colon cancer Neg Hx    Breast cancer Neg Hx    Ovarian cancer Neg Hx    Endometrial cancer Neg Hx    Pancreatic cancer Neg Hx    Prostate cancer Neg Hx    Colon polyps Neg Hx    Esophageal cancer Neg Hx    Stomach cancer Neg Hx    Rectal cancer Neg Hx     Social History   Socioeconomic History   Marital status: Married    Spouse name: Not on file   Number of children: 2   Years of education: Not on file   Highest education level: Bachelor's degree (e.g., BA, AB, BS)  Occupational History    Employer: [employer]    Comment: retired   Occupation: retired  Tobacco Use   Smoking status: Former    Current packs/day: 0.00    Average packs/day:  1 pack/day for 15.0 years (15.0 ttl pk-yrs)    Types: Cigarettes    Start date: 60    Quit date: 50    Years since quitting: 28.5   Smokeless tobacco: Never  Vaping Use   Vaping status: Never Used  Substance and Sexual Activity   Alcohol use: Yes    Alcohol/week: 3.0 standard drinks of alcohol    Types: 3 Cans of beer per week    Comment: social   Drug use: Yes    Types: Marijuana    Comment: 1-2 times over past year   Sexual activity: Yes    Birth control/protection: Post-menopausal  Other Topics Concern   Not on file  Social History Narrative   Work or School: [employer]      Home Situation: lives with husband      Spiritual Beliefs: Christian      Lifestyle: no regular exercise; diet good            Social Drivers of Corporate investment banker Strain: Low Risk  (02/12/2024)   Overall Financial Resource Strain (CARDIA)    Difficulty of Paying Living Expenses: Not hard at all  Food Insecurity: No Food Insecurity (02/12/2024)   Hunger  Vital Sign    Worried About Running Out of Food in the Last Year: Never true    Ran Out of Food in the Last Year: Never true  Transportation Needs: No Transportation Needs (02/12/2024)   PRAPARE - Administrator, Civil Service (Medical): No    Lack of Transportation (Non-Medical): No  Physical Activity: Insufficiently Active (02/12/2024)   Exercise Vital Sign    Days of Exercise per Week: 4 days    Minutes of Exercise per Session: 20 min  Stress: No Stress Concern Present (02/12/2024)   [employer] of Occupational Health - Occupational Stress Questionnaire    Feeling of Stress: Not at all  Social Connections: Moderately Integrated (02/12/2024)   Social Connection and Isolation Panel    Frequency of Communication with Friends and Family: Three times a week    Frequency of Social Gatherings with Friends and Family: Three times a week    Attends Religious Services: 1 to 4 times per year    Active Member of Clubs or Organizations: No    Attends Engineer, structural: Not on file    Marital Status: Married    Current Medications:  Current Outpatient Medications:    alendronate  (FOSAMAX ) 70 MG tablet, TAKE 1 TABLET EVERY WEEK WITH FULL GLASS OF WATER ON EMPTY STOMACH. SIT UPRIGHT FOR 30 MINUTES, Disp: 12 tablet, Rfl: 3   aspirin  EC 81 MG EC tablet, Take 1 tablet (81 mg total) by mouth daily. Swallow whole., Disp: 30 tablet, Rfl: 11   atorvastatin  (LIPITOR) 40 MG tablet, TAKE 1 TABLET AT BEDTIME, Disp: 90 tablet, Rfl: 3   Calcium  Carbonate (CALCIUM -CARB 600 PO), Take 1 tablet by mouth daily., Disp: , Rfl:    cholecalciferol (VITAMIN D ) 1000 UNITS tablet, Take 1,000 Units by mouth daily., Disp: , Rfl:    estradiol  (ESTRACE  VAGINAL) 0.1 MG/GM vaginal cream, Place 1 Applicatorful vaginally at bedtime. Apply a small amount (using your finger) inside the vagina twice a week at night, Disp: 42.5 g, Rfl: 12   pantoprazole  (PROTONIX ) 20 MG tablet, TAKE 1 TABLET BY MOUTH EVERY  DAY, Disp: 90 tablet, Rfl: 3   tretinoin (RETIN-A) 0.025 % cream, SMARTSIG:sparingly Topical Every Night, Disp: , Rfl:  cyclobenzaprine  (FLEXERIL ) 10 MG tablet, TAKE 1 TABLET BY MOUTH AT BEDTIME AS NEEDED FOR MUSCLE SPASMS, Disp: 90 tablet, Rfl: 3  Review of Systems: Denies appetite changes, fevers, chills, fatigue, unexplained weight changes. Denies hearing loss, neck lumps or masses, mouth sores, ringing in ears or voice changes. Denies cough or wheezing.  Denies shortness of breath. Denies chest pain or palpitations. Denies leg swelling. Denies abdominal distention, pain, blood in stools, constipation, diarrhea, nausea, vomiting, or early satiety. Denies pain with intercourse, dysuria, frequency, hematuria or incontinence. Denies hot flashes, pelvic pain, vaginal bleeding or vaginal discharge.   Denies joint pain, back pain or muscle pain/cramps. Denies itching, rash, or wounds. Denies dizziness, headaches, numbness or seizures. Denies swollen lymph nodes or glands, denies easy bruising or bleeding. Denies anxiety, depression, confusion, or decreased concentration.  Physical Exam: BP 128/75 (BP Location: Left Arm, Patient Position: Sitting)   Pulse 71   Temp 98.6 F (37 C) (Oral)   Resp 18   Wt 176 lb (79.8 kg)   LMP 08/21/2002   SpO2 97%   BMI 28.41 kg/m  General: Alert, oriented, no acute distress. HEENT: Normocephalic, atraumatic, sclera anicteric. Chest: Clear to auscultation bilaterally.  No wheezes or rhonchi. Cardiovascular: Regular rate and rhythm, no murmurs. Abdomen: soft, nontender.  Normoactive bowel sounds.  No masses or hepatosplenomegaly appreciated.   Extremities: Grossly normal range of motion.  Warm, well perfused.  No edema bilaterally. Skin: No rashes or lesions noted. Lymphatics: No cervical, supraclavicular, or inguinal adenopathy. GU: Normal appearing external genitalia without erythema, excoriation, or lesions.  Speculum exam reveals radiation changes  at the vaginal apex and cervix, moderate atrophy also noted.  Area along the left upper vaginal sidewall now with atrophy and radiation changes, stable.  Bimanual exam reveals cervix is flush with the vaginal apex long the posterior aspect, no firmness or nodularity.  Rectovaginal exam confirms these findings, no parametrial nodularity or thickening.   Laboratory & Radiologic Studies: None new  Assessment & Plan: Taylor Buchanan is a 67 y.o. woman with Stage IB2 SCC who presents for follow-up after completion of chemoradiation in 11/2021. Last imaging (PET 09/2022) without metastatic/recurrent disease.   Patient is doing well and is NED on exam today.   Pap in 11/2023 NIML, HR HPV +. Colposcopy was performed in May with no lesions/abnormalities noted.   Continue vaginal estrogen in the setting of her atrophy.  The patient has not been using this as frequently recently.  Encouraged her to start using more regularly.   Per NCCN surveillance recommendations, we will continue to alternate visits every 3 months between my office and Dr. Shannon. Discussed signs and symptoms that would be concerning for cancer recurrence, and I stressed the importance of calling if she develops any of these before her next visit.  20 minutes of total time was spent for this patient encounter, including preparation, face-to-face counseling with the patient and coordination of care, and documentation of the encounter.  Comer Dollar, MD  Division of Gynecologic Oncology  Department of Obstetrics and Gynecology  [facility]

## 2024-06-16 ENCOUNTER — Ambulatory Visit: Admitting: Radiation Oncology

## 2024-06-21 NOTE — Progress Notes (Signed)
 Radiation Oncology         [phone] ________________________________  Name: Taylor Buchanan MRN: [id number]  Date: 06/23/2024  DOB: 06-16-1956  Follow-Up Visit Note  CC: Jodie Lavern CROME, MD  Jodie Lavern CROME, MD  No diagnosis found.  Diagnosis:  The encounter diagnosis was Malignant neoplasm of cervix, unspecified site [facility]).   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)  Interval Since Last Radiation: approximately 2 years and 7 months   Intent: Curative  Radiation Treatment Dates: 09/12/2021 through 11/22/2021   IMRT pelvis: 09/12/21 through 10/14/21   Brachytherapy cevix: 10/25/21 through 11/22/21   Site Technique Total Dose (Gy) Dose per Fx (Gy) Completed Fx Beam Energies  Pelvis: Pelvis IMRT 45/45 1.8 25/25 6X  Cervix: Cervix_Bst_Fx1 HDR-brachy 5.5/5.5 5.5 1/1 Ir-192  Cervix: Cervix_Bst_Fx2 HDR-brachy 5.5/5.5 5.5 1/1 Ir-192  Cervix: Cervix_Bst_Fx3 HDR-brachy 5.5/5.5 5.5 1/1 Ir-192  Cervix: Cervix_Bst_Fx4 HDR-brachy 5.5/5.5 5.5 1/1 Ir-192  Cervix: Cervix_Bst_Fx5 HDR-brachy 5.5/5.5 5.5 1/1 Ir-192   Narrative:  The patient returns today for routine follow-up. She was last seen here for follow-up on 12/17/23. Pap and HPV testing were collected at the time of her last visit with us . Her Pap was negative however her HPV testing came back positive.   She subsequently presented to Dr. Rogelio for a colposcopy on 01/16/24. Exam findings were normal and without visible lesions.   She also met with Dr. Viktoria on 03/17/24 for a routine follow-up visit. She was noted to be doing well and NED on examination at that time. Exam findings did note some atrophy along the left upper vaginal side wall and she has been advised to continue using her vaginal estrogen for management of this.   No other significant oncologic interval history since the patient was last seen for follow-up.   ***                     Allergies:  has no known allergies.  Meds: Current Outpatient  Medications  Medication Sig Dispense Refill   alendronate  (FOSAMAX ) 70 MG tablet TAKE 1 TABLET EVERY WEEK WITH FULL GLASS OF WATER ON EMPTY STOMACH. SIT UPRIGHT FOR 30 MINUTES 12 tablet 3   aspirin  EC 81 MG EC tablet Take 1 tablet (81 mg total) by mouth daily. Swallow whole. 30 tablet 11   atorvastatin  (LIPITOR) 40 MG tablet TAKE 1 TABLET AT BEDTIME 90 tablet 3   Calcium  Carbonate (CALCIUM -CARB 600 PO) Take 1 tablet by mouth daily.     cholecalciferol (VITAMIN D ) 1000 UNITS tablet Take 1,000 Units by mouth daily.     estradiol  (ESTRACE  VAGINAL) 0.1 MG/GM vaginal cream Place 1 Applicatorful vaginally at bedtime. Apply a small amount (using your finger) inside the vagina twice a week at night 42.5 g 12   pantoprazole  (PROTONIX ) 20 MG tablet TAKE 1 TABLET BY MOUTH EVERY DAY 90 tablet 3   tretinoin (RETIN-A) 0.025 % cream SMARTSIG:sparingly Topical Every Night     No current facility-administered medications for this encounter.    Physical Findings: The patient is in no acute distress. Patient is alert and oriented.  vitals were not taken for this visit. .  No significant changes. Lungs are clear to auscultation bilaterally. Heart has regular rate and rhythm. No palpable cervical, supraclavicular, or axillary adenopathy. Abdomen soft, non-tender, normal bowel sounds.  On pelvic examination the external genitalia were unremarkable. A speculum exam was performed. There are no mucosal lesions noted in the vaginal vault. A Pap smear  was obtained of the proximal vagina. On bimanual and rectovaginal examination there were no pelvic masses appreciated. ***   Lab Findings: Lab Results  Component Value Date   WBC 5.8 02/13/2024   HGB 14.0 02/13/2024   HCT 41.6 02/13/2024   MCV 98.1 02/13/2024   PLT 297.0 02/13/2024    Radiographic Findings: No results found.  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  ***  Plan:   ***   *** minutes of total time was spent for this patient encounter, including preparation, face-to-face counseling with the patient and coordination of care, physical exam, and documentation of the encounter. ____________________________________  Lynwood CHARM Nasuti, PhD, MD  This document serves as a record of services personally performed by Lynwood Nasuti, MD. It was created on his behalf by Dorthy Fuse, a trained medical scribe. The creation of this record is based on the scribe's personal observations and the provider's statements to them. This document has been checked and approved by the attending provider.

## 2024-06-23 ENCOUNTER — Encounter: Payer: Self-pay | Admitting: Radiation Oncology

## 2024-06-23 ENCOUNTER — Ambulatory Visit
Admission: RE | Admit: 2024-06-23 | Discharge: 2024-06-23 | Disposition: A | Discharge status: Home / Self Care | Source: Ambulatory Visit | Attending: Radiation Oncology | Admitting: Radiation Oncology

## 2024-06-23 VITALS — BP 130/78 | HR 78 | Temp 97.8°F | Resp 18 | Ht 66.0 in | Wt 178.6 lb

## 2024-06-23 DIAGNOSIS — Z923 Personal history of irradiation: Secondary | ICD-10-CM | POA: Insufficient documentation

## 2024-06-23 DIAGNOSIS — Z8541 Personal history of malignant neoplasm of cervix uteri: Secondary | ICD-10-CM | POA: Insufficient documentation

## 2024-06-23 DIAGNOSIS — Z7982 Long term (current) use of aspirin: Secondary | ICD-10-CM | POA: Diagnosis not present

## 2024-06-23 DIAGNOSIS — Z79899 Other long term (current) drug therapy: Secondary | ICD-10-CM | POA: Insufficient documentation

## 2024-06-23 DIAGNOSIS — C539 Malignant neoplasm of cervix uteri, unspecified: Secondary | ICD-10-CM

## 2024-06-23 NOTE — Progress Notes (Addendum)
 Taylor Buchanan is here today for follow up post radiation to the pelvis.   They completed their radiation on:  11/22/21  Does the patient complain of any of the following:  Pain:No Abdominal bloating: No Diarrhea/Constipation: No Nausea/Vomiting: No Vaginal Discharge: No Blood in Urine or Stool: No Urinary Issues (dysuria/incomplete emptying/ incontinence/ increased frequency/urgency): No Does patient report using vaginal dilator 2-3 times a week and/or sexually active 2-3 weeks: Yes, patient is sexually active.  Post radiation skin changes: No   Additional comments if applicable: Patients last pap 12/17/23   BP 130/78 (BP Location: Left Arm, Patient Position: Sitting, Cuff Size: Large)   Pulse 78   Temp 97.8 F (36.6 C)   Resp 18   Ht 5' 6 (1.676 m)   Wt 178 lb 9.6 oz (81 kg)   LMP 08/21/2002   SpO2 98%   BMI 28.83 kg/m

## 2024-07-16 ENCOUNTER — Telehealth: Payer: Self-pay

## 2024-07-16 NOTE — Telephone Encounter (Signed)
 Per request from Dr.Tucker, pt's appointment has been rescheduled from 1/15-1/2. Pt agrees to new date

## 2024-08-22 ENCOUNTER — Encounter: Payer: Self-pay | Admitting: Gynecologic Oncology

## 2024-08-22 ENCOUNTER — Inpatient Hospital Stay: Admitting: Gynecologic Oncology

## 2024-08-22 VITALS — BP 135/78 | HR 73 | Temp 98.9°F | Resp 19 | Wt 177.4 lb

## 2024-08-22 DIAGNOSIS — C539 Malignant neoplasm of cervix uteri, unspecified: Secondary | ICD-10-CM

## 2024-08-22 DIAGNOSIS — Z8541 Personal history of malignant neoplasm of cervix uteri: Secondary | ICD-10-CM | POA: Insufficient documentation

## 2024-08-22 DIAGNOSIS — Z923 Personal history of irradiation: Secondary | ICD-10-CM | POA: Diagnosis not present

## 2024-08-22 DIAGNOSIS — Z9221 Personal history of antineoplastic chemotherapy: Secondary | ICD-10-CM | POA: Insufficient documentation

## 2024-08-22 DIAGNOSIS — Z08 Encounter for follow-up examination after completed treatment for malignant neoplasm: Secondary | ICD-10-CM | POA: Insufficient documentation

## 2024-08-22 NOTE — Patient Instructions (Signed)
 It was good to see you today.  I do not see or feel any evidence of cancer recurrence on your exam.  We will see you for follow-up in 9 months.  As always, if you develop any new and concerning symptoms before your next visit, please call to see me sooner.

## 2024-08-22 NOTE — Progress Notes (Signed)
 Gynecologic Oncology Return Clinic Visit  08/22/2024  Reason for Visit: surveillance  Treatment History: Oncology History  Malignant neoplasm of cervix (HCC)  07/05/2021 Pathology Results   A. ENDOMETRIAL CURETTINGS:  Blood clots containing numerous strips of squamous epithelium with features of high-grade SIL(CIN-3/severe dysplasia).  Normal endocervical tissue or endometrial tissue are not identified.   B. CERVIX, CONE BIOPSY:  Invasive squamous cell carcinoma, focally keratinizing and moderately differentiated with circumferential involvement.  All margins including the deep margin are positive for squamous cell carcinoma.  Please see comment. [id number] positive   08/16/2021 Initial Diagnosis   Malignant neoplasm of cervix (HCC)   08/30/2021 PET scan   Focal hypermetabolism in the region of the cervix, consistent with known primary cervical carcinoma.   No evidence of pelvic lymph node or distant metastatic disease.     09/01/2021 Cancer Staging   Staging form: Cervix Uteri, AJCC Version 9 - Clinical stage from 09/01/2021: FIGO Stage IB1 (cT1b1, cN0, cM0) - Signed by Lonn Hicks, MD on 09/01/2021 Stage prefix: Initial diagnosis   09/08/2021 Procedure   Successful placement of a right internal jugular approach power injectable Port-A-Cath. The catheter is ready for immediate use.     09/12/2021 - 11/22/2021 Radiation Therapy   IMRT pelvis: 09/12/21 through 10/14/21  Brachytherapy cevix: 10/25/21 through 11/22/21   Site Technique Total Dose (Gy) Dose per Fx (Gy) Completed Fx Beam Energies  Pelvis: Pelvis IMRT 45/45 1.8 25/25 6X  Cervix: Cervix_Bst_Fx1 HDR-brachy 5.5/5.5 5.5 1/1 Ir-192  Cervix: Cervix_Bst_Fx2 HDR-brachy 5.5/5.5 5.5 1/1 Ir-192  Cervix: Cervix_Bst_Fx3 HDR-brachy 5.5/5.5 5.5 1/1 Ir-192  Cervix: Cervix_Bst_Fx4 HDR-brachy 5.5/5.5 5.5 1/1 Ir-192  Cervix: Cervix_Bst_Fx5 HDR-brachy 5.5/5.5 5.5 1/1 Ir-192       09/13/2021 - 10/11/2021 Chemotherapy   Patient is on Treatment  Plan : cervical Cisplatin  q7d      02/23/2022 PET scan   Marked interval response to therapy with reduction of metabolic activity in the area of the cervix. Residual activity only minimally greater than mediastinal blood pool, attention on follow-up.   No signs of metastatic disease.   03/03/2022 Procedure   Removal of implanted Port-A-Cath utilizing sharp and blunt dissection. The procedure was uncomplicated.     Interval History: Doing well.  Denies any vaginal bleeding or discharge.  Has been using vaginal estrogen about every other week.  Endorses normal bowel and bladder function.  Past Medical/Surgical History: Past Medical History:  Diagnosis Date   Abnormal Pap smear of cervix    Arthritis    Cervical cancer (HCC)    Depression    GAD (generalized anxiety disorder)    GERD (gastroesophageal reflux disease)    History of migraine    History of radiation therapy    cervix - pelvis  09/12/2021-10/14/2021 Dr Lynwood Nasuti   History of radiation therapy    cervix - HDR brachy tandem and ring 10/25/2021-11/22/2021  Dr Lynwood Nasuti   History of transient ischemic attack (TIA) 01/2021   neruologist--- dr rosemarie;  ad[facility]/ 2022 in [facility] ,  per note complicated migraine vs anxiety,  less likely TIA   HLD (hyperlipidemia)    Leukopenia due to antineoplastic chemotherapy 10/10/2021   Osteoporosis    Pain disorder    Scoliosis    Stroke [facility]) 2022   TIA   Torn ACL (anterior cruciate ligament) left hand 11/05/2021   wearing splint seeing ortho md week of 11-14-2021    Past Surgical History:  Procedure Laterality Date   CERVICAL CONIZATION W/BX N/A 08/04/2021  Procedure: CONIZATION CERVIX WITH BIOPSY: DILATION AND CURETTAGE OF UTERUS;  Surgeon: Delana Ted Morrison, DO;  Location: [facility];  Service: Gynecology;  Laterality: N/A;   CHOLECYSTECTOMY OPEN  2005   approx   IR IMAGING GUIDED PORT INSERTION  09/08/2021   IR REMOVAL TUN ACCESS W/ PORT W/O FL MOD SED   03/03/2022   OPERATIVE ULTRASOUND N/A 10/25/2021   Procedure: OPERATIVE ULTRASOUND;  Surgeon: Shannon Agent, MD;  Location: [facility];  Service: Urology;  Laterality: N/A;   OPERATIVE ULTRASOUND N/A 11/03/2021   Procedure: OPERATIVE ULTRASOUND;  Surgeon: Shannon Agent, MD;  Location: [facility];  Service: Urology;  Laterality: N/A;   OPERATIVE ULTRASOUND N/A 11/10/2021   Procedure: OPERATIVE ULTRASOUND;  Surgeon: Shannon Agent, MD;  Location: [facility];  Service: Urology;  Laterality: N/A;   OPERATIVE ULTRASOUND N/A 11/18/2021   Procedure: OPERATIVE ULTRASOUND;  Surgeon: Shannon Agent, MD;  Location: [facility];  Service: Urology;  Laterality: N/A;   OPERATIVE ULTRASOUND N/A 11/22/2021   Procedure: OPERATIVE ULTRASOUND;  Surgeon: Shannon Agent, MD;  Location[facility];  Service: Urology;  Laterality: N/A;   TANDEM RING INSERTION N/A 10/25/2021   Procedure: TANDEM RING INSERTION;  Surgeon: Shannon Agent, MD;  Location: [facility];  Service: Urology;  Laterality: N/A;   TANDEM RING INSERTION N/A 11/03/2021   Procedure: TANDEM RING INSERTION;  Surgeon: Shannon Agent, MD;  Location[facility];  Service: Urology;  Laterality: N/A;   TANDEM RING INSERTION N/A 11/10/2021   Procedure: TANDEM RING INSERTION;  Surgeon: Shannon Agent, MD;  Location: [facility];  Service: Urology;  Laterality: N/A;   TANDEM RING INSERTION N/A 11/18/2021   Procedure: TANDEM RING INSERTION;  Surgeon: Shannon Agent, MD;  Location: [facility];  Service: Urology;  Laterality: N/A;   TANDEM RING INSERTION N/A 11/22/2021   Procedure: TANDEM RING INSERTION;  Surgeon: Shannon Agent, MD;  Location: [facility];  Service: Urology;  Laterality: N/A;   TENDON REPAIR Left 2008   Ankle/ foot    Family History  Problem Relation Age of Onset   Mental illness Mother    Stroke Mother     Dementia Mother    Alzheimer's disease Mother    Bladder Cancer Father    Healthy Brother    Healthy Brother    Healthy Brother    CAD Maternal Grandmother    CAD Maternal Grandfather    Healthy Son    Healthy Son    Colon cancer Neg Hx    Breast cancer Neg Hx    Ovarian cancer Neg Hx    Endometrial cancer Neg Hx    Pancreatic cancer Neg Hx    Prostate cancer Neg Hx    Colon polyps Neg Hx    Esophageal cancer Neg Hx    Stomach cancer Neg Hx    Rectal cancer Neg Hx     Social History   Socioeconomic History   Marital status: Married    Spouse name: Not on file   Number of children: 2   Years of education: Not on file   Highest education level: Bachelor's degree (e.g., BA, AB, BS)  Occupational History    Employer: [employer]    Comment: retired   Occupation: retired  Tobacco Use   Smoking status: Former    Current packs/day: 0.00    Average packs/day: 1 pack/day for 15.0 years (15.0 ttl  pk-yrs)    Types: Cigarettes    Start date: 63    Quit date: 1997    Years since quitting: 29.0   Smokeless tobacco: Never  Vaping Use   Vaping status: Never Used  Substance and Sexual Activity   Alcohol use: Yes    Alcohol/week: 3.0 standard drinks of alcohol    Types: 3 Cans of beer per week    Comment: social   Drug use: Yes    Types: Marijuana    Comment: 1-2 times over past year   Sexual activity: Yes    Birth control/protection: Post-menopausal  Other Topics Concern   Not on file  Social History Narrative   Work or School: [employer]      Home Situation: lives with husband      Spiritual Beliefs: Christian      Lifestyle: no regular exercise; diet good            Social Drivers of Health   Tobacco Use: Medium Risk (08/22/2024)   Patient History    Smoking Tobacco Use: Former    Smokeless Tobacco Use: Never    Passive Exposure: Not on Actuary Strain: Low Risk (02/12/2024)   Overall Financial Resource Strain (CARDIA)     Difficulty of Paying Living Expenses: Not hard at all  Food Insecurity: No Food Insecurity (02/12/2024)   Epic    Worried About Radiation Protection Practitioner of Food in the Last Year: Never true    Ran Out of Food in the Last Year: Never true  Transportation Needs: No Transportation Needs (02/12/2024)   Epic    Lack of Transportation (Medical): No    Lack of Transportation (Non-Medical): No  Physical Activity: Insufficiently Active (02/12/2024)   Exercise Vital Sign    Days of Exercise per Week: 4 days    Minutes of Exercise per Session: 20 min  Stress: No Stress Concern Present (02/12/2024)   [employer] of Occupational Health - Occupational Stress Questionnaire    Feeling of Stress: Not at all  Social Connections: Moderately Integrated (02/12/2024)   Social Connection and Isolation Panel    Frequency of Communication with Friends and Family: Three times a week    Frequency of Social Gatherings with Friends and Family: Three times a week    Attends Religious Services: 1 to 4 times per year    Active Member of Clubs or Organizations: No    Attends Banker Meetings: Not on file    Marital Status: Married  Depression (PHQ2-9): Low Risk (02/13/2024)   Depression (PHQ2-9)    PHQ-2 Score: 0  Alcohol Screen: Low Risk (02/12/2024)   Alcohol Screen    Last Alcohol Screening Score (AUDIT): 2  Housing: Low Risk (02/12/2024)   Epic    Unable to Pay for Housing in the Last Year: No    Number of Times Moved in the Last Year: 0    Homeless in the Last Year: No  Utilities: Not At Risk (09/26/2023)   AHC Utilities    Threatened with loss of utilities: No  Health Literacy: Adequate Health Literacy (09/26/2023)   B1300 Health Literacy    Frequency of need for help with medical instructions: Never    Current Medications: Current Medications[1]  Review of Systems: Denies appetite changes, fevers, chills, fatigue, unexplained weight changes. Denies hearing loss, neck lumps or masses, mouth sores,  ringing in ears or voice changes. Denies cough or wheezing.  Denies shortness of breath. Denies chest pain  or palpitations. Denies leg swelling. Denies abdominal distention, pain, blood in stools, constipation, diarrhea, nausea, vomiting, or early satiety. Denies pain with intercourse, dysuria, frequency, hematuria or incontinence. Denies hot flashes, pelvic pain, vaginal bleeding or vaginal discharge.   Denies joint pain, back pain or muscle pain/cramps. Denies itching, rash, or wounds. Denies dizziness, headaches, numbness or seizures. Denies swollen lymph nodes or glands, denies easy bruising or bleeding. Denies anxiety, depression, confusion, or decreased concentration.  Physical Exam: BP 135/78 (BP Location: Left Arm, Patient Position: Sitting)   Pulse 73   Temp 98.9 F (37.2 C) (Oral)   Resp 19   Wt 177 lb 6.4 oz (80.5 kg)   LMP 08/21/2002   SpO2 97%   BMI 28.63 kg/m  General: Alert, oriented, no acute distress. HEENT: Normocephalic, atraumatic, sclera anicteric. Chest: Clear to auscultation bilaterally.  No wheezes or rhonchi. Cardiovascular: Regular rate and rhythm, no murmurs. Abdomen: soft, nontender.  Normoactive bowel sounds.  No masses or hepatosplenomegaly appreciated.   Extremities: Grossly normal range of motion.  Warm, well perfused.  No edema bilaterally. Skin: No rashes or lesions noted. Lymphatics: No cervical, supraclavicular, or inguinal adenopathy. GU: Normal appearing external genitalia without erythema, excoriation, or lesions.  Speculum exam reveals atrophy and radiation changes at the vaginal apex, left upper vagina, and cervix, findingsstable.  Bimanual exam reveals cervix is flush with the vaginal apex long the posterior aspect, no firmness or nodularity.  Rectovaginal exam confirms these findings, no parametrial nodularity or thickening.  Laboratory & Radiologic Studies: None new  Assessment & Plan: Taylor Buchanan is a 69 y.o. woman with Stage IB2 SCC  who presents for follow-up after completion of chemoradiation in 11/2021. Last imaging (PET 09/2022) without metastatic/recurrent disease.   Patient is doing well and is NED on exam today.    Pap in 11/2023 NIML, HR HPV +. Colposcopy was performed in May with no lesions/abnormalities noted.  She is due for her next Pap and HPV testing at her follow-up in 12/2023.     Discussed continuation of vaginal estrogen in the setting of vaginal atrophy.     Per NCCN surveillance recommendations, we will continue to alternate visits every 3 months between my office and Dr. Shannon.  After her next visit, she will be 3 years out from completion of adjuvant treatment and we will transition to visits every 6 months.  Discussed signs and symptoms that would be concerning for cancer recurrence, and I stressed the importance of calling if she develops any of these before her next visit.  20 minutes of total time was spent for this patient encounter, including preparation, face-to-face counseling with the patient and coordination of care, and documentation of the encounter.  Comer Dollar, MD  Division of Gynecologic Oncology  Department of Obstetrics and Gynecology  [facility]      [1]  Current Outpatient Medications:    alendronate  (FOSAMAX ) 70 MG tablet, TAKE 1 TABLET EVERY WEEK WITH FULL GLASS OF WATER ON EMPTY STOMACH. SIT UPRIGHT FOR 30 MINUTES, Disp: 12 tablet, Rfl: 3   aspirin  EC 81 MG EC tablet, Take 1 tablet (81 mg total) by mouth daily. Swallow whole., Disp: 30 tablet, Rfl: 11   atorvastatin  (LIPITOR) 40 MG tablet, TAKE 1 TABLET AT BEDTIME, Disp: 90 tablet, Rfl: 3   Calcium  Carbonate (CALCIUM -CARB 600 PO), Take 1 tablet by mouth daily., Disp: , Rfl:    cholecalciferol (VITAMIN D ) 1000 UNITS tablet, Take 1,000 Units by mouth daily., Disp: ,  Rfl:    estradiol  (ESTRACE  VAGINAL) 0.1 MG/GM vaginal cream, Place 1 Applicatorful vaginally at bedtime. Apply a small amount (using  your finger) inside the vagina twice a week at night, Disp: 42.5 g, Rfl: 12   pantoprazole  (PROTONIX ) 20 MG tablet, TAKE 1 TABLET BY MOUTH EVERY DAY, Disp: 90 tablet, Rfl: 3   tretinoin (RETIN-A) 0.025 % cream, SMARTSIG:sparingly Topical Every Night, Disp: , Rfl:

## 2024-09-04 ENCOUNTER — Ambulatory Visit: Admitting: Gynecologic Oncology

## 2024-10-02 ENCOUNTER — Ambulatory Visit: Payer: Medicare Other

## 2024-10-08 ENCOUNTER — Ambulatory Visit

## 2024-12-22 ENCOUNTER — Ambulatory Visit: Admitting: Radiation Oncology

## 2025-02-12 ENCOUNTER — Ambulatory Visit: Admitting: Family Medicine

## 2025-05-12 ENCOUNTER — Inpatient Hospital Stay: Admitting: Gynecologic Oncology
